# Patient Record
Sex: Female | Born: 1960 | Race: Black or African American | Hispanic: No | Smoking: Former smoker
Health system: Southern US, Community
[De-identification: ages and names within clinical notes are randomized; demographics above are authoritative.]

## PROBLEM LIST (undated history)

## (undated) DIAGNOSIS — I10 Essential (primary) hypertension: Secondary | ICD-10-CM

## (undated) DIAGNOSIS — T7840XA Allergy, unspecified, initial encounter: Secondary | ICD-10-CM

## (undated) DIAGNOSIS — Z9889 Other specified postprocedural states: Secondary | ICD-10-CM

## (undated) DIAGNOSIS — D649 Anemia, unspecified: Secondary | ICD-10-CM

## (undated) DIAGNOSIS — M1990 Unspecified osteoarthritis, unspecified site: Secondary | ICD-10-CM

## (undated) DIAGNOSIS — E039 Hypothyroidism, unspecified: Secondary | ICD-10-CM

## (undated) DIAGNOSIS — E079 Disorder of thyroid, unspecified: Secondary | ICD-10-CM

## (undated) DIAGNOSIS — Z87442 Personal history of urinary calculi: Secondary | ICD-10-CM

## (undated) DIAGNOSIS — E119 Type 2 diabetes mellitus without complications: Secondary | ICD-10-CM

## (undated) HISTORY — DX: Allergy, unspecified, initial encounter: T78.40XA

## (undated) HISTORY — PX: TONSILLECTOMY: SUR1361

## (undated) HISTORY — DX: Essential (primary) hypertension: I10

## (undated) HISTORY — PX: BLADDER SURGERY: SHX569

## (undated) HISTORY — PX: BREAST BIOPSY: SHX20

## (undated) HISTORY — PX: THYROIDECTOMY: SHX17

## (undated) HISTORY — DX: Unspecified osteoarthritis, unspecified site: M19.90

## (undated) HISTORY — DX: Disorder of thyroid, unspecified: E07.9

## (undated) HISTORY — PX: WISDOM TOOTH EXTRACTION: SHX21

## (undated) HISTORY — DX: Anemia, unspecified: D64.9

## (undated) HISTORY — PX: BREAST SURGERY: SHX581

---

## 2010-10-20 ENCOUNTER — Other Ambulatory Visit: Payer: Self-pay | Admitting: Specialist

## 2010-10-20 DIAGNOSIS — E039 Hypothyroidism, unspecified: Secondary | ICD-10-CM

## 2010-10-22 ENCOUNTER — Ambulatory Visit
Admission: RE | Admit: 2010-10-22 | Discharge: 2010-10-22 | Disposition: A | Discharge status: Home / Self Care | Payer: Medicaid Other | Source: Ambulatory Visit | Attending: Specialist | Admitting: Specialist

## 2010-10-22 DIAGNOSIS — E039 Hypothyroidism, unspecified: Secondary | ICD-10-CM

## 2010-11-10 ENCOUNTER — Encounter: Payer: Self-pay | Admitting: Obstetrics & Gynecology

## 2010-11-10 ENCOUNTER — Ambulatory Visit (INDEPENDENT_AMBULATORY_CARE_PROVIDER_SITE_OTHER): Payer: Medicaid Other | Admitting: Obstetrics & Gynecology

## 2010-11-10 ENCOUNTER — Other Ambulatory Visit (HOSPITAL_COMMUNITY)
Admission: RE | Admit: 2010-11-10 | Discharge: 2010-11-10 | Disposition: A | Discharge status: Home / Self Care | Payer: Medicaid Other | Source: Ambulatory Visit | Attending: Obstetrics & Gynecology | Admitting: Obstetrics & Gynecology

## 2010-11-10 VITALS — BP 102/71 | HR 80 | Ht 65.0 in | Wt 226.0 lb

## 2010-11-10 DIAGNOSIS — Z1272 Encounter for screening for malignant neoplasm of vagina: Secondary | ICD-10-CM

## 2010-11-10 DIAGNOSIS — Z01419 Encounter for gynecological examination (general) (routine) without abnormal findings: Secondary | ICD-10-CM

## 2010-11-10 DIAGNOSIS — Z1231 Encounter for screening mammogram for malignant neoplasm of breast: Secondary | ICD-10-CM

## 2010-11-10 DIAGNOSIS — N949 Unspecified condition associated with female genital organs and menstrual cycle: Secondary | ICD-10-CM

## 2010-11-10 DIAGNOSIS — Z113 Encounter for screening for infections with a predominantly sexual mode of transmission: Secondary | ICD-10-CM | POA: Insufficient documentation

## 2010-11-10 DIAGNOSIS — N951 Menopausal and female climacteric states: Secondary | ICD-10-CM

## 2010-11-10 DIAGNOSIS — N938 Other specified abnormal uterine and vaginal bleeding: Secondary | ICD-10-CM

## 2010-11-10 DIAGNOSIS — E079 Disorder of thyroid, unspecified: Secondary | ICD-10-CM

## 2010-11-10 MED ORDER — VENLAFAXINE HCL ER 75 MG PO CP24: 75.0000 mg | ORAL_CAPSULE | Freq: Every day | ORAL | Status: DC

## 2010-11-10 NOTE — Patient Instructions (Signed)

## 2010-11-10 NOTE — Progress Notes (Signed)
  Subjective:     Katherine Estes is a 50 y.o. female here for a routine exam.  Current complaints: perimenopausal mood lability, loss of libido, irregular menses during months of April-June.  She reported having two periods a month during those months, lasting 5-7 days.  July was normal.  No other symptoms.  Followed by her PCP for her thyroid issues, is being referred to endocrinology.   Gynecologic History Patient's last menstrual period was 10/12/2010. Contraception: abstinence Last Pap: 11/03/09. Results were: normal Last mammogram: 9 years ago. Results were: normal  Obstetric History [id number]  SVD x 1 C/S x 1, SAB x 2  The following portions of the patient's history were reviewed and updated as appropriate: allergies, current medications, past family history, past medical history, past social history, past surgical history and problem list.  Review of Systems Pertinent items are noted in HPI.    Objective:    GENERAL: Well-developed, well-nourished female in no acute distress.  HEENT: Normocephalic, atraumatic. Sclerae anicteric.  NECK: Supple. Thyroid surgery scar. LUNGS: Clear to auscultation bilaterally.  HEART: Regular rate and rhythm. BREASTS: Symmetric with everted nipples. No masses, skin changes, nipple drainage,   lymphadenopathy. ABDOMEN: Soft, nontender, obese, nondistended. No organomegaly. PELVIC: Normal external female genitalia. Vagina is pink and rugated.  Normal discharge. Normal cervix contour. Uterus is normal in size. No adnexal mass or tenderness. Pap smear obtained. Wet prep obtained as part of STI screening. EXTREMITIES: No cyanosis, clubbing, or edema, 2+ distal pulses.     Assessment:   Healthy female exam. Desires STI screening. Perimenopausal symptoms: DUB, mood lability, loss of libido   Plan:  Will follow up pap, STI screening labs; schedule mammogram Counseled regarding perimenopausal symptoms: recommended Effexor for mood swings.  Patient agreed,  medication e-prescribed.  As for her loss of libido, patient declines counseling/other treatment for now, will continue to monitor.  For her irregular menses, will obtain a pelvic ultrasound, may need endometrial biopsy depending on results or if her symptoms continue.  Bleeding precautions reviewed.  Will also followup endocrinology recommendations, thyroid dysfunction could also potentiate these perimenopausal symptoms.  RTC in 2 months for followup.  Dalon Reichart A 11/10/2010 9:50 AM

## 2010-11-11 LAB — HEPATITIS B SURFACE ANTIBODY,QUALITATIVE: Hep B S Ab: NEGATIVE

## 2010-11-11 LAB — HEPATITIS C ANTIBODY: HCV Ab: NEGATIVE

## 2010-11-11 LAB — HIV ANTIBODY (ROUTINE TESTING W REFLEX): HIV: NONREACTIVE

## 2010-11-11 LAB — WET PREP, GENITAL: Yeast Wet Prep HPF POC: NONE SEEN

## 2010-11-20 ENCOUNTER — Ambulatory Visit (HOSPITAL_COMMUNITY): Payer: Medicaid Other

## 2010-11-20 ENCOUNTER — Other Ambulatory Visit (HOSPITAL_COMMUNITY): Payer: Medicaid Other

## 2010-11-25 ENCOUNTER — Encounter: Payer: Self-pay | Admitting: Obstetrics & Gynecology

## 2010-11-27 ENCOUNTER — Ambulatory Visit (HOSPITAL_COMMUNITY)
Admission: RE | Admit: 2010-11-27 | Discharge: 2010-11-27 | Disposition: A | Discharge status: Home / Self Care | Payer: Medicaid Other | Source: Ambulatory Visit | Attending: Obstetrics & Gynecology | Admitting: Obstetrics & Gynecology

## 2010-11-27 DIAGNOSIS — N938 Other specified abnormal uterine and vaginal bleeding: Secondary | ICD-10-CM | POA: Insufficient documentation

## 2010-11-27 DIAGNOSIS — Z1231 Encounter for screening mammogram for malignant neoplasm of breast: Secondary | ICD-10-CM

## 2010-11-27 DIAGNOSIS — N949 Unspecified condition associated with female genital organs and menstrual cycle: Secondary | ICD-10-CM | POA: Insufficient documentation

## 2010-11-27 DIAGNOSIS — D259 Leiomyoma of uterus, unspecified: Secondary | ICD-10-CM | POA: Insufficient documentation

## 2010-11-27 DIAGNOSIS — N83209 Unspecified ovarian cyst, unspecified side: Secondary | ICD-10-CM | POA: Insufficient documentation

## 2010-12-24 ENCOUNTER — Ambulatory Visit (INDEPENDENT_AMBULATORY_CARE_PROVIDER_SITE_OTHER): Payer: Medicaid Other | Admitting: Obstetrics & Gynecology

## 2010-12-24 ENCOUNTER — Encounter: Payer: Self-pay | Admitting: Obstetrics & Gynecology

## 2010-12-24 DIAGNOSIS — D219 Benign neoplasm of connective and other soft tissue, unspecified: Secondary | ICD-10-CM | POA: Insufficient documentation

## 2010-12-24 DIAGNOSIS — N926 Irregular menstruation, unspecified: Secondary | ICD-10-CM

## 2010-12-24 DIAGNOSIS — D259 Leiomyoma of uterus, unspecified: Secondary | ICD-10-CM

## 2010-12-24 NOTE — Progress Notes (Signed)
History:  50 yo [id number] here for followup for irregular menses evaluation.  Followed by an endocrinologist for thyroid dysfunction; will see this provider in a few days. Pelvic ultrasound ordered at last visit. Patient reports having a period that lasted seven days on 11/28/10 then again on 12/13/10.  She feels that her period is about to start today.  Denies LH, dizziness or other symptoms.  Objective:  Labs and Imaging TRANSABDOMINAL AND TRANSVAGINAL ULTRASOUND OF PELVIS (11/27/10) Findings:  Uterus: 10.6 x 5.8 x 5.9 cm. Multiple small fibroids are seen which involve the uterus diffusely. At least four discrete fibroids are measurable, and these range in size from 0.8 cm to 1.9 cm in maximum diameter.  Endometrium: Suboptimally visualized due to acoustic shadowing from fibroids described above. Maximum endometrial thickness where visualized measures approximately 5 mm.  Right ovary: A small hemorrhagic corpus luteum measuring 1.7 cm. No evidence of adnexal mass.  Left ovary: Normal appearance/no adnexal mass.  Other Findings: No free fluid  IMPRESSION:  1. Multiple small uterine fibroids, largest measuring 1.9 cm.  2. 1.7 cm right ovarian hemorrhagic cyst with benign features.   Given continued bleeding, endometrial biopsy was recommended.    ENDOMETRIAL BIOPSY     The indications for endometrial biopsy were reviewed.   Risks of the biopsy including cramping, bleeding, infection, uterine perforation, inadequate specimen and need for additional procedures  were discussed. The patient states she understands and agrees to undergo procedure today. Consent was signed. Time out was performed.  A sterile speculum was placed in the patient's vagina and the cervix was prepped with Betadine. A single-toothed tenaculum was placed on the anterior lip of the cervix to stabilize it. The 3 mm pipelle was introduced into the endometrial cavity without difficulty to a depth of 6 cm, and a small amount of tissue was  obtained and sent to pathology. There was some cervical stenosis but the pipelle was used to breach this stenosis; and the cavity was entered. The instruments were removed from the patient's vagina. Minimal bleeding from the cervix was noted. The patient tolerated the procedure well. Routine post-procedure instructions were given to the patient. Assessment & Plan:  The patient will follow up to review the results and for further management.  Discussed management options for irregular menses including oral Provera, Depo Provera, Mirena IUD, endometrial ablation (Novasure/Hydrothermal Ablation/Thermachoice) or hysterectomy as definitive surgical management.  Discussed risks and benefits of each method. Patient was given a patient education pamphlet; will decide by next visit in two weeks, pending the pathology results. Will also follow up endocrinologist workup.

## 2011-01-11 ENCOUNTER — Other Ambulatory Visit: Payer: Medicaid Other | Admitting: Obstetrics & Gynecology

## 2011-01-11 DIAGNOSIS — R8789 Other abnormal findings in specimens from female genital organs: Secondary | ICD-10-CM

## 2011-04-07 ENCOUNTER — Encounter: Payer: Self-pay | Admitting: Obstetrics & Gynecology

## 2011-04-07 ENCOUNTER — Ambulatory Visit (INDEPENDENT_AMBULATORY_CARE_PROVIDER_SITE_OTHER): Payer: Medicaid Other | Admitting: Obstetrics & Gynecology

## 2011-04-07 VITALS — BP 105/75 | HR 78 | Ht 68.0 in | Wt 213.0 lb

## 2011-04-07 DIAGNOSIS — B373 Candidiasis of vulva and vagina: Secondary | ICD-10-CM

## 2011-04-07 MED ORDER — FLUCONAZOLE 150 MG PO TABS: 150.0000 mg | ORAL_TABLET | Freq: Once | ORAL | Status: AC

## 2011-04-07 MED ORDER — TERCONAZOLE 0.4 % VA CREA
1.0000 | TOPICAL_CREAM | Freq: Every day | VAGINAL | Status: AC
Start: 2011-04-07 — End: 2011-04-14

## 2011-04-07 NOTE — Patient Instructions (Signed)
Candidal Vulvovaginitis Candidal vulvovaginitis is an infection of the vagina and vulva. The vulva is the skin around the opening of the vagina. This may cause itching and discomfort in and around the vagina.  HOME CARE  Only take medicine as told by your doctor.   Do not have sex (intercourse) until the infection is healed or as told by your doctor.   Practice safe sex.   Tell your sex partner about your infection.   Do not douche or use tampons.   Wear cotton underwear. Do not wear tight pants or panty hose.   Eat yogurt. This may help treat and prevent yeast infections.  GET HELP RIGHT AWAY IF:   You have a fever.   Your problems get worse during treatment or do not get better in 3 days.   You have discomfort, irritation, or itching in your vagina or vulva area.   You have pain after sex.   You start to get belly (abdominal) pain.  MAKE SURE YOU:  Understand these instructions.   Will watch your condition.   Will get help right away if you are not doing well or get worse.  Document Released: 06/04/2008 Document Revised: 11/18/2010 Document Reviewed: 06/04/2008 [employer] Patient Information 2012 [employer]. 

## 2011-04-07 NOTE — Progress Notes (Signed)
History:  51 y.o. with complaint of vaginitis for a few days.  Happened after intercourse.  Discharge is white, clumpy and causes irritation.  The following portions of the patient's history were reviewed and updated as appropriate: allergies, current medications, past family history, past medical history, past social history, past surgical history and problem list. Last pap in 10/2010 was normal, also had normal mammogram in 11/2010.   Objective:  Physical Exam Blood pressure 105/75, pulse 78, height 5\' 8"  (1.727 m), weight 213 lb (96.616 kg), last menstrual period 03/17/2011. Gen: NAD Abd: Soft, nontender and nondistended Pelvic: Normal appearing external genitalia; normal appearing vaginal mucosa and cervix.  Thick, white discharge in vagina and vulva.  Small uterus, no palpable masses or adnexal tenderness. Wet prep and GC/Chlam cultures obtained.  Assessment & Plan:  Will follow up results but will presumptively treat with antifungal medication. Return to clinic for any worsening symptoms.

## 2011-04-08 LAB — WET PREP, GENITAL: Trich, Wet Prep: NONE SEEN

## 2011-04-08 LAB — GC/CHLAMYDIA PROBE AMP, GENITAL: Chlamydia, DNA Probe: NEGATIVE

## 2011-10-27 ENCOUNTER — Ambulatory Visit (INDEPENDENT_AMBULATORY_CARE_PROVIDER_SITE_OTHER): Payer: Medicaid Other | Admitting: Obstetrics & Gynecology

## 2011-10-27 ENCOUNTER — Encounter: Payer: Self-pay | Admitting: Obstetrics & Gynecology

## 2011-10-27 VITALS — BP 139/99 | HR 73 | Ht 68.0 in | Wt 204.0 lb

## 2011-10-27 DIAGNOSIS — B373 Candidiasis of vulva and vagina: Secondary | ICD-10-CM

## 2011-10-27 DIAGNOSIS — Z309 Encounter for contraceptive management, unspecified: Secondary | ICD-10-CM

## 2011-10-27 MED ORDER — NYSTATIN 100000 UNITS VA TABS: 1.0000 | ORAL_TABLET | Freq: Every day | VAGINAL | Status: AC

## 2011-10-27 NOTE — Patient Instructions (Signed)
Vaginitis monilisica (Monilial Vaginitis) La vaginitis es una inflamacin (irritacin, hinchazn) de la vagina y la vulva. Esta no es una enfermedad de transmisin sexual.  CAUSAS Este tipo de vaginitis lo causa un hongo (candida) que normalmente se encuentra en la vagina. El hongo candida se ha desarrollado hasta el punto de ocasionar problemas en el equilibrio qumico. SNTOMAS  Secrecin vaginal espesa y blanca.   Hinchazn, picazn, enrojecimiento e inflamacin de la vagina y en algunos casos de los labios vaginales (vulva).   Ardor o dolor al [employer].   Dolor en las relaciones sexuales.  DIAGNSTICO Los factores que favorecen la vaginitis moniliasica son:  Everlean Patterson de virginidad y postmenopusicas.   Embarazo.   Infecciones.   Sentir cansancio, estar enferma o estresada, especialmente si ya ha sufrido este problema en el pasado.   Diabetes Buen control ayudar a disminur la probabilidad.   Pldoras anticonceptivas   Ropa interior [place].   El uso de espumas de bao, aerosoles femeninos duchas vaginales o tampones con desodorante.   Algunos antibiticos (medicamentos que destruyen grmenes).   Si contrae alguna enfermedad puede sufrir recurrencias espordicas.  TRATAMIENTO El profesional que lo asiste prescribir medicamentos.  Hay diferentes tipos de cremas y supositorios vaginales que tratan especficamente la vaginitis monilisica. Para infecciones por hongos recurrentes, utilice un supositorio o crema en la vagina dos veces por semana, o segn se le indique.   Tambin podrn utilizarse cremas con corticoides o anti monilisicas para la picazn o la irritacin de la vulva. Consulte con el profesional que la asiste.   Si la crema no da resultado, podr aplicarse en la vagina una solucin con azul de metileno.   El consumo de yogur puede prevenir este tipo de vaginitis.  INSTRUCCIONES PARA EL CUIDADO DOMICILIARIO  Tome todos los medicamentos tal como se le  indic.   No mantenga relaciones sexuales hasta que el tratamiento se haya completado, o segn las indicaciones del profesional que la asiste.   Tome baos de asiento tibios.   No se aplique duchas vaginales.   No utilice tampones, especialmente los perfumados.   Use ropa interior de algodn   Mirant pantalones ajustados y las medias tipo panty.   Comunique a sus compaeros sexuales que sufre una infeccin por hongos. Ellos deben concurrir para un control mdico si tienen sntomas como una urticaria leve o picazn.   Sus compaeros sexuales deben tratarse tambin si la infeccin es difcil de Pharmacologist.   Practique el sexo seguro - use condones   Algunos medicamentos vaginales ocasionan fallas en los condones de ltex. Los medicamentos vaginales que pueden daar los condones son:   Chiropodist cleocina   Butoconazole (Femstat)   Terconazole (Terazol) supositorios vaginales   Miconazole (Monistat) (es un medicamento de venta libre)  SOLICITE ATENCIN MDICA SI:  Daphane Shepherd tiene una temperatura oral de ms de 102 F (38.9 C).   Si la infeccin empeora luego de 2 [address].   Si la infeccin no mejora luego de 3 [address].   Aparecen ampollas en o alrededor de la vagina.   Si aparece una hemorragia vaginal y no es el momento del perodo.   Siente dolor al [employer].   Presenta problemas intestinales.   Tiene dolor durante las [employer].  Document Released: 12/16/2004 Document Revised: 02/25/2011 [facility] Patient Information 2012 [place], [place].Contraceptive Implant Information A contraceptive implant is a plastic rod that is inserted under the skin. It is usually inserted under the skin of your upper arm. It  continually releases small amounts of progestin (synthetic progesterone) into the bloodstream. This prevents an egg from being released from the ovary. It also thickens the cervical mucus to prevent sperm from entering the cervix, and it thins  the uterine lining to prevent a fertilized egg from attaching to the uterus. They can be effective for up to 3 years. Implants do not provide protection against sexually transmitted diseases (STDs).  The procedure to insert an implant usually takes about 10 minutes. There may be minor bruising, swelling, and discomfort at the insertion site for a couple days. The implant begins to work within the first day. Other contraceptive protection should be used for 2 weeks. Follow up with your caregiver to get rechecked as directed. Your caregiver will make sure you are a good candidate for the contraceptive implant. Discuss with your caregiver the possible side effects of the implant ADVANTAGES  It prevents pregnancy for up to 3 years.   It is easily reversible.   It is convenient.   The progestins may protect against uterine and ovarian cancer.   It can be used when breastfeeding.   It can be used by women who cannot take estrogen.  DISADVANTAGES  You may have irregular or unplanned vaginal bleeding.   You may develop side effects, including headache, weight gain, acne, breast tenderness, or mood changes.   You may have tissue or nerve damage after insertion (rare).   It may be difficult and uncomfortable to remove.   Certain medications may interfere with the effectiveness of the implants.  REMOVAL OF IMPLANT The implant should be removed in 3 years or as directed by your caregiver. The implants effect wears off in a few hours after removal. Your ability to get pregnant (fertility) is restored within a couple of weeks. New implants can be inserted as soon as the old ones are removed if desired. DO NOT GET THE IMPLANT IF:   You are pregnant.   You have a history of breast cancer, osteoporosis, blood clots, heart disease, diabetes, high blood pressure, liver disease, tumors, or stroke.    You have undiagnosed vaginal bleeding.   You have overly sensitive to certain parts of the  implant.  Document Released: 02/25/2011 Document Reviewed: 02/23/2011 [facility] Patient Information 2012 [place], [place].

## 2011-10-27 NOTE — Progress Notes (Signed)
Subjective:     Patient ID: Katherine Estes, female   DOB: October 28, 1960, 51 y.o.   MRN: [id number]  HPI Pt c/o pain at introitus x 1 day.  No abnl discharge.  Sexually active.  Wants birth control.  BP uncontrolled as pt off meds.  Will f/u with primary care for eval   Review of Systems     Objective:   Physical Exam Pt in NAD GU: EGBUS: no lesions Vagina: no blood in vault, small amount of while cottage cheese d/c at introitus only Cervix: no lesion; no mucopurulent d/c Uterus: small, mobile Adnexa: no masses; sl tender        Assessment:     Yeast vaginitis Contraception counseling.  Reviewed options with pt.  Wants to proceed with Nexplanon     Plan:     F/u for Nexplanon Nystatin for yeast  Loreena Valeri L. Harraway-Smith, M.D., Evern Core

## 2011-10-27 NOTE — Addendum Note (Signed)
Addended by: Barbara Cower on: 10/27/2011 02:52 PM   Modules accepted: Orders

## 2011-11-04 ENCOUNTER — Ambulatory Visit (INDEPENDENT_AMBULATORY_CARE_PROVIDER_SITE_OTHER): Payer: Medicaid Other | Admitting: Obstetrics & Gynecology

## 2011-11-04 ENCOUNTER — Encounter: Payer: Self-pay | Admitting: Obstetrics & Gynecology

## 2011-11-04 VITALS — BP 137/87 | HR 80 | Ht 68.0 in | Wt 205.1 lb

## 2011-11-04 DIAGNOSIS — Z01812 Encounter for preprocedural laboratory examination: Secondary | ICD-10-CM

## 2011-11-04 DIAGNOSIS — Z30017 Encounter for initial prescription of implantable subdermal contraceptive: Secondary | ICD-10-CM

## 2011-11-04 DIAGNOSIS — Z309 Encounter for contraceptive management, unspecified: Secondary | ICD-10-CM

## 2011-11-04 LAB — POCT URINE PREGNANCY: Preg Test, Ur: NEGATIVE

## 2011-11-04 NOTE — Progress Notes (Signed)
Here today for Nexplanon insertion Patient given informed consent, she signed consent form. Pregnancy test was negative.  Appropriate time out taken.  Patient's left arm was prepped and draped in the usual sterile fashion.. The ruler used to measure and mark insertion area.  Patient was prepped with alcohol swab and then injected with 5 ml of 1 % lidocaine.  She was prepped with betadine, Nexplanon removed from packaging,  Device confirmed in needle, then inserted full length of needle and withdrawn per handbook instructions.  There was minimal blood loss.  Patient insertion site covered with guaze and a pressure bandage to reduce any bruising.  The patient tolerated the procedure well and was given post procedure instructions. Return in about two months for Nexplanon check.

## 2011-11-04 NOTE — Patient Instructions (Signed)

## 2011-11-26 ENCOUNTER — Ambulatory Visit (INDEPENDENT_AMBULATORY_CARE_PROVIDER_SITE_OTHER): Payer: Medicaid Other | Admitting: Obstetrics and Gynecology

## 2011-11-26 ENCOUNTER — Encounter: Payer: Self-pay | Admitting: Obstetrics and Gynecology

## 2011-11-26 VITALS — BP 119/84 | HR 84 | Ht 68.0 in | Wt 207.0 lb

## 2011-11-26 DIAGNOSIS — K649 Unspecified hemorrhoids: Secondary | ICD-10-CM

## 2011-11-26 NOTE — Progress Notes (Signed)
Patient ID: Katherine Estes, female   DOB: 19-Aug-1960, 51 y.o.   MRN: [id number] Patient presents today for evaluation of a what she thinks is a boil near her rectal area. Patient states that she noticed it a week ago when having a bowel movement. Patient denies any pain or discomfort from it.  PE: small 0.5 mm hemorrhoid, soft to touch, not bleeding  A/P 51 yo with small non-bleeding rectal hemorrhoid - Reassurance provided - Patient advised to use over the counter medications for comfort care if it becomes an issue - Patient advised to consume a fiber rich diet and drink a lot of water

## 2012-05-17 ENCOUNTER — Ambulatory Visit (INDEPENDENT_AMBULATORY_CARE_PROVIDER_SITE_OTHER): Payer: Medicaid Other | Admitting: Obstetrics and Gynecology

## 2012-05-17 ENCOUNTER — Encounter: Payer: Self-pay | Admitting: Obstetrics and Gynecology

## 2012-05-17 VITALS — BP 125/82 | HR 76 | Ht 68.0 in | Wt 212.0 lb

## 2012-05-17 NOTE — Progress Notes (Signed)
Patient ID: Katherine Estes, female   DOB: 01-20-1961, 52 y.o.   MRN: [id number] Patient presenting today requesting removal in implanon. Implanon was inserted in August 2013 and patient reports increased facial acne and increased appetite and weight gain. Patient is also unhappy with the irregular spotting. Patient is not currently sexually active and if she does, she is planning on using condoms for birth control  Removal Patient given informed consent for removal of her Implanon, time out was performed.  Signed copy in the chart.  Appropriate time out taken. Implanon site identified.  Area prepped in usual sterile fashon. One cc of 1% lidocaine was used to anesthetize the area at the distal end of the implant. A small stab incision was made right beside the implant on the distal portion.  The implanon rod was grasped using hemostats and removed without difficulty.  There was less than 3 cc blood loss. There were no complications.  A small amount of antibiotic ointment and steri-strips were applied over the small incision.  A pressure bandage was applied to reduce any bruising.  The patient tolerated the procedure well and was given post procedure instructions.

## 2012-06-07 ENCOUNTER — Encounter: Payer: Self-pay | Admitting: Obstetrics and Gynecology

## 2012-06-07 ENCOUNTER — Ambulatory Visit (INDEPENDENT_AMBULATORY_CARE_PROVIDER_SITE_OTHER): Payer: Medicaid Other | Admitting: Obstetrics and Gynecology

## 2012-06-07 VITALS — BP 125/86 | HR 81 | Ht 68.0 in | Wt 208.0 lb

## 2012-06-07 DIAGNOSIS — N83209 Unspecified ovarian cyst, unspecified side: Secondary | ICD-10-CM
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
## 2012-06-19 ENCOUNTER — Ambulatory Visit (HOSPITAL_COMMUNITY)
Admission: RE | Admit: 2012-06-19 | Discharge: 2012-06-19 | Disposition: A | Discharge status: Home / Self Care | Payer: Medicaid Other | Source: Ambulatory Visit | Attending: Obstetrics and Gynecology | Admitting: Obstetrics and Gynecology

## 2012-06-19 DIAGNOSIS — N83209 Unspecified ovarian cyst, unspecified side: Secondary | ICD-10-CM

## 2012-06-19 DIAGNOSIS — Z1231 Encounter for screening mammogram for malignant neoplasm of breast: Secondary | ICD-10-CM | POA: Insufficient documentation

## 2012-06-20 ENCOUNTER — Other Ambulatory Visit: Payer: Self-pay | Admitting: Obstetrics and Gynecology

## 2012-06-20 DIAGNOSIS — R928 Other abnormal and inconclusive findings on diagnostic imaging of breast: Secondary | ICD-10-CM

## 2012-06-29 ENCOUNTER — Telehealth: Payer: Self-pay

## 2012-06-29 NOTE — Telephone Encounter (Signed)
Called in Dyflucan 150 mg to her [facility] at [facility] had symptoms of yeast infection. Gave her no refills. Per the Inetta Fermo the nurse

## 2012-07-03 ENCOUNTER — Ambulatory Visit
Admission: RE | Admit: 2012-07-03 | Discharge: 2012-07-03 | Disposition: A | Discharge status: Home / Self Care | Payer: Medicaid Other | Source: Ambulatory Visit | Attending: Obstetrics and Gynecology | Admitting: Obstetrics and Gynecology

## 2012-07-03 DIAGNOSIS — R928 Other abnormal and inconclusive findings on diagnostic imaging of breast: Secondary | ICD-10-CM

## 2012-09-10 ENCOUNTER — Emergency Department (HOSPITAL_COMMUNITY): Payer: Medicaid Other

## 2012-09-10 ENCOUNTER — Emergency Department (HOSPITAL_COMMUNITY)
Admission: EM | Admit: 2012-09-10 | Discharge: 2012-09-10 | Disposition: A | Discharge status: Home / Self Care | Payer: Medicaid Other | Attending: Emergency Medicine | Admitting: Emergency Medicine

## 2012-09-10 ENCOUNTER — Encounter (HOSPITAL_COMMUNITY): Payer: Self-pay | Admitting: Emergency Medicine

## 2012-09-10 DIAGNOSIS — N949 Unspecified condition associated with female genital organs and menstrual cycle: Secondary | ICD-10-CM | POA: Insufficient documentation

## 2012-09-10 DIAGNOSIS — E079 Disorder of thyroid, unspecified: Secondary | ICD-10-CM | POA: Insufficient documentation

## 2012-09-10 DIAGNOSIS — R102 Pelvic and perineal pain: Secondary | ICD-10-CM

## 2012-09-10 DIAGNOSIS — M810 Age-related osteoporosis without current pathological fracture: Secondary | ICD-10-CM | POA: Insufficient documentation

## 2012-09-10 DIAGNOSIS — Z880 Allergy status to penicillin: Secondary | ICD-10-CM | POA: Insufficient documentation

## 2012-09-10 DIAGNOSIS — M129 Arthropathy, unspecified: Secondary | ICD-10-CM | POA: Insufficient documentation

## 2012-09-10 DIAGNOSIS — Z79899 Other long term (current) drug therapy: Secondary | ICD-10-CM | POA: Insufficient documentation

## 2012-09-10 DIAGNOSIS — I10 Essential (primary) hypertension: Secondary | ICD-10-CM | POA: Insufficient documentation

## 2012-09-10 DIAGNOSIS — Z862 Personal history of diseases of the blood and blood-forming organs and certain disorders involving the immune mechanism: Secondary | ICD-10-CM | POA: Insufficient documentation

## 2012-09-10 DIAGNOSIS — J45909 Unspecified asthma, uncomplicated: Secondary | ICD-10-CM | POA: Insufficient documentation

## 2012-09-10 DIAGNOSIS — Z87891 Personal history of nicotine dependence: Secondary | ICD-10-CM | POA: Insufficient documentation

## 2012-09-10 LAB — URINALYSIS, ROUTINE W REFLEX MICROSCOPIC
Glucose, UA: NEGATIVE mg/dL
Ketones, ur: NEGATIVE mg/dL
Protein, ur: NEGATIVE mg/dL
Urobilinogen, UA: 0.2 mg/dL (ref 0.0–1.0)

## 2012-09-10 LAB — URINE MICROSCOPIC-ADD ON

## 2012-09-10 MED ORDER — IBUPROFEN 800 MG PO TABS
800.0000 mg | ORAL_TABLET | Freq: Once | ORAL | Status: AC
  Administered 2012-09-10: 800 mg via ORAL
  Filled 2012-09-10: qty 1

## 2012-09-10 MED ORDER — OXYCODONE-ACETAMINOPHEN 5-325 MG PO TABS: 1.0000 | ORAL_TABLET | Freq: Four times a day (QID) | ORAL | Status: DC | PRN

## 2012-09-10 NOTE — ED Notes (Signed)
Pt c/o left side abdominal pain onset Thursday. Pt reports history of ovarian cysts and contributed pain to that and being on her menstrual. Pt reports period has since ended 2 days ago but continues to have pain. Pt reports nausea x 2 days. Pt denies urinary symptoms.

## 2012-09-10 NOTE — ED Notes (Signed)
Pt ambulated to the BR.

## 2012-09-10 NOTE — ED Notes (Signed)
Pt in ultrasound

## 2012-09-10 NOTE — ED Notes (Signed)
Dr. Pickering at the bedside.  

## 2012-09-10 NOTE — ED Provider Notes (Signed)
History     CSN: [id number]  Arrival date & time 09/10/12  1231   First MD Initiated Contact with Patient 09/10/12 1251      Chief Complaint  Patient presents with  . Abdominal Pain    Left side    (Consider location/radiation/quality/duration/timing/severity/associated sxs/prior treatment) Patient is a 52 y.o. female presenting with abdominal pain. The history is provided by the patient.  Abdominal Pain This is a recurrent problem. Associated symptoms include abdominal pain. Pertinent negatives include no chest pain, no headaches and no shortness of breath.   patient has had left-sided pelvic pain for the last week. It started as her typical menstrual cramps. She states it went away then became more severe. She states she finished up a Couple days ago. No dysuria. No nausea vomiting or diarrhea. No hematuria. No vaginal discharge. She states she's been seen this before a CT that showed a cyst. She states that another ultrasound which did not show the cyst. No fevers. No change in her bowel habits  Past Medical History  Diagnosis Date  . Allergy   . Anemia   . Arthritis   . Asthma   . Hypertension   . Thyroid disease   . Osteoporosis     Past Surgical History  Procedure Laterality Date  . Cesarean section    . Tonsillectomy    . Bladder surgery      BLADDER LIFTED  . Thyroidectomy    . Breast surgery      LUMP EXCISION LEFT BREAST.  Marland Kitchen Wisdom tooth extraction      Family History  Problem Relation Age of Onset  . Arthritis Mother   . Asthma Mother   . Hypertension Mother   . Cancer Maternal Grandmother     THYRIOD    History  Substance Use Topics  . Smoking status: Former Smoker    Types: Cigarettes  . Smokeless tobacco: Not on file     Comment: x 35yrs ago.  . Alcohol Use: Yes     Comment: socially     OB History   Grav Para Term Preterm Abortions TAB SAB Ect Mult Living                  Review of Systems  Constitutional: Negative for activity  change and appetite change.  HENT: Negative for neck stiffness.   Eyes: Negative for pain.  Respiratory: Negative for chest tightness and shortness of breath.   Cardiovascular: Negative for chest pain and leg swelling.  Gastrointestinal: Positive for abdominal pain. Negative for nausea, vomiting and diarrhea.  Genitourinary: Positive for pelvic pain. Negative for flank pain, decreased urine volume, vaginal bleeding, vaginal discharge and vaginal pain.  Musculoskeletal: Negative for back pain.  Skin: Negative for rash.  Neurological: Negative for weakness, numbness and headaches.  Psychiatric/Behavioral: Negative for behavioral problems.    Allergies  Penicillins  Home Medications   Current Outpatient Rx  Name  Route  Sig  Dispense  Refill  . albuterol (PROVENTIL) (5 MG/ML) 0.5% nebulizer solution   Nebulization   Take 2.5 mg by nebulization every 6 (six) hours as needed.           . Biotin 1000 MCG tablet   Oral   Take 1,000 mcg by mouth 3 (three) times daily.         Thomasene Mohair [place] 450 MG CAPS   Oral   Take 450 each by mouth.         . cholecalciferol (VITAMIN  D) 1000 UNITS tablet   Oral   Take 1,000 Units by mouth daily.         . Fluticasone-Salmeterol (ADVAIR) 500-50 MCG/DOSE AEPB   Inhalation   Inhale 1 puff into the lungs every 12 (twelve) hours.           Marland Kitchen levothyroxine (SYNTHROID, LEVOTHROID) 175 MCG tablet   Oral   Take 175 mcg by mouth daily.         Marland Kitchen loratadine (CLARITIN) 10 MG tablet   Oral   Take 10 mg by mouth daily.         . methocarbamol (ROBAXIN) 500 MG tablet   Oral   Take 500 mg by mouth 2 (two) times daily.         . Olmesartan-Amlodipine-HCTZ (TRIBENZOR) 20-5-12.5 MG TABS   Oral   Take 1 tablet by mouth daily.         . phentermine 37.5 MG capsule   Oral   Take 37.5 mg by mouth every morning.         . topiramate (TOPAMAX) 100 MG tablet   Oral   Take 100 mg by mouth 2 (two) times daily.         Marland Kitchen  oxyCODONE-acetaminophen (PERCOCET/ROXICET) 5-325 MG per tablet   Oral   Take 1-2 tablets by mouth every 6 (six) hours as needed for pain.   15 tablet   0     BP 142/80  Pulse 91  Temp(Src) 98.4 F (36.9 C) (Oral)  Resp 18  SpO2 98%  LMP 09/04/2012  Physical Exam  Nursing note and vitals reviewed. Constitutional: She is oriented to person, place, and time. She appears well-developed and well-nourished.  HENT:  Head: Normocephalic and atraumatic.  Eyes: EOM are normal. Pupils are equal, round, and reactive to light.  Neck: Normal range of motion. Neck supple.  Cardiovascular: Normal rate, regular rhythm and normal heart sounds.   No murmur heard. Pulmonary/Chest: Effort normal and breath sounds normal. No respiratory distress. She has no wheezes. She has no rales.  Abdominal: Soft. Bowel sounds are normal. She exhibits no distension. There is no tenderness. There is no rebound and no guarding.  Genitourinary:  Pelvic exam deferred  Musculoskeletal: Normal range of motion.  Neurological: She is alert and oriented to person, place, and time. No cranial nerve deficit.  Skin: Skin is warm and dry.  Psychiatric: She has a normal mood and affect. Her speech is normal.    ED Course  Procedures (including critical care time)  Labs Reviewed  URINALYSIS, ROUTINE W REFLEX MICROSCOPIC - Abnormal; Notable for the following:    APPearance CLOUDY (*)    Hgb urine dipstick SMALL (*)    Leukocytes, UA TRACE (*)    All other components within normal limits  URINE MICROSCOPIC-ADD ON   US Transvaginal Non-ob  09/10/2012   *RADIOLOGY REPORT*  Clinical Data:  Left sided pelvic pain.  TRANSABDOMINAL ULTRASOUND OF PELVIS DOPPLER ULTRASOUND OF OVARIES  Technique:  Transabdominal ultrasound examination of the pelvis was performed including evaluation of the uterus, ovaries, adnexal regions, and pelvic cul-de-sac.  Color and duplex Doppler ultrasound was utilized to evaluate blood flow to the  ovaries.  Comparison:  06/19/2012  Findings:  Uterus:  10.3 x 5.5 x 6.2 cm.  Multiple fibroids, the largest being 2.3 cm in the posterior aspect of the body of the uterus.  No significant change since the prior exam.  Endometrium:  Normal.  5.7 mm in thickness.  Right ovary:  Normal.  1.8 x 1.6 x 1.7 cm.  Left ovary:  Normal.  1.9 x 1 5 x 1.4 cm.  Pulsed Doppler evaluation demonstrates normal low-resistance arterial and venous waveforms in both ovaries.  IMPRESSION: Multiple stable fibroids in the uterus.  Otherwise normal exam. Normal perfusion to the ovaries.  No sonographic evidence for ovarian torsion.   Original Report Authenticated By: Francene Boyers, M.D.   US Pelvis Complete  09/10/2012   *RADIOLOGY REPORT*  Clinical Data:  Left sided pelvic pain.  TRANSABDOMINAL ULTRASOUND OF PELVIS DOPPLER ULTRASOUND OF OVARIES  Technique:  Transabdominal ultrasound examination of the pelvis was performed including evaluation of the uterus, ovaries, adnexal regions, and pelvic cul-de-sac.  Color and duplex Doppler ultrasound was utilized to evaluate blood flow to the ovaries.  Comparison:  06/19/2012  Findings:  Uterus:  10.3 x 5.5 x 6.2 cm.  Multiple fibroids, the largest being 2.3 cm in the posterior aspect of the body of the uterus.  No significant change since the prior exam.  Endometrium:  Normal.  5.7 mm in thickness.  Right ovary:  Normal.  1.8 x 1.6 x 1.7 cm.  Left ovary:  Normal.  1.9 x 1 5 x 1.4 cm.  Pulsed Doppler evaluation demonstrates normal low-resistance arterial and venous waveforms in both ovaries.  IMPRESSION: Multiple stable fibroids in the uterus.  Otherwise normal exam. Normal perfusion to the ovaries.  No sonographic evidence for ovarian torsion.   Original Report Authenticated By: Francene Boyers, M.D.   [place] Art/ven Flow Abd Pelv Doppler  09/10/2012   *RADIOLOGY REPORT*  Clinical Data:  Left sided pelvic pain.  TRANSABDOMINAL ULTRASOUND OF PELVIS DOPPLER ULTRASOUND OF OVARIES  Technique:   Transabdominal ultrasound examination of the pelvis was performed including evaluation of the uterus, ovaries, adnexal regions, and pelvic cul-de-sac.  Color and duplex Doppler ultrasound was utilized to evaluate blood flow to the ovaries.  Comparison:  06/19/2012  Findings:  Uterus:  10.3 x 5.5 x 6.2 cm.  Multiple fibroids, the largest being 2.3 cm in the posterior aspect of the body of the uterus.  No significant change since the prior exam.  Endometrium:  Normal.  5.7 mm in thickness.  Right ovary:  Normal.  1.8 x 1.6 x 1.7 cm.  Left ovary:  Normal.  1.9 x 1 5 x 1.4 cm.  Pulsed Doppler evaluation demonstrates normal low-resistance arterial and venous waveforms in both ovaries.  IMPRESSION: Multiple stable fibroids in the uterus.  Otherwise normal exam. Normal perfusion to the ovaries.  No sonographic evidence for ovarian torsion.   Original Report Authenticated By: Francene Boyers, M.D.     1. Pelvic pain       MDM  Patient has recurrent pelvic pain with her period. Benign examination. No abdominal tenderness. No rash. No bowel issues. GI pathology felt less likely. Urologic pathology felt less likely. Patient feels somewhat better and will be discharged home. She'll followup as needed she'll return for fevers, urinary, or bowel symptoms        Juliet Rude. Rubin Payor, MD 09/10/12 1555

## 2012-09-11 ENCOUNTER — Telehealth: Payer: Self-pay | Admitting: *Deleted

## 2012-09-11 NOTE — Telephone Encounter (Signed)
Pt called adv seen at ED for Left side ABD pain - Didn't get any answers as to what the problem was and can't take the pain meds while working - I adv will make appt for her to come in to see one of the providers here to get an action plan together to correct the abdominal pain

## 2012-09-12 ENCOUNTER — Ambulatory Visit (INDEPENDENT_AMBULATORY_CARE_PROVIDER_SITE_OTHER): Payer: Medicaid Other | Admitting: Obstetrics and Gynecology

## 2012-09-12 VITALS — BP 99/74 | HR 84 | Ht 68.0 in | Wt 216.0 lb

## 2012-09-12 DIAGNOSIS — R1032 Left lower quadrant pain: Secondary | ICD-10-CM

## 2012-09-12 MED ORDER — IBUPROFEN 800 MG PO TABS: 800.0000 mg | ORAL_TABLET | Freq: Three times a day (TID) | ORAL | Status: DC | PRN

## 2012-09-12 NOTE — Progress Notes (Signed)
Left sided pelvic pain, rates at at ten.  Was seen at [facility] ER on Sunday without a definitive diagnosis.  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

## 2012-09-12 NOTE — Progress Notes (Signed)
Patient ID: Katherine Estes, female   DOB: 13-Jan-1961, 52 y.o.   MRN: [id number] 52 yo presenting today as an ED follow up for the evaluation of LLQ pain. Patient states the pain started around the time of her menses on 6/16 but never stopped. She normally suffers from dysmenorrhea and thought that was all but when the pain persisted she presented to the ED on 6/22. Patient describes the pain as a throbbing pain that is not radiating. There are no aggravating or alleviating factors. Patient has a long standing history of constipation and has not had a recent bowel movement in over a week.  Past Medical History  Diagnosis Date  . Allergy   . Anemia   . Arthritis   . Asthma   . Hypertension   . Thyroid disease   . Osteoporosis    Past Surgical History  Procedure Laterality Date  . Cesarean section    . Tonsillectomy    . Bladder surgery      BLADDER LIFTED  . Thyroidectomy    . Breast surgery      LUMP EXCISION LEFT BREAST.  Marland Kitchen Wisdom tooth extraction     Family History  Problem Relation Age of Onset  . Arthritis Mother   . Asthma Mother   . Hypertension Mother   . Cancer Maternal Grandmother     THYRIOD   History  Substance Use Topics  . Smoking status: Former Smoker    Types: Cigarettes  . Smokeless tobacco: Not on file     Comment: x 27yrs ago.  . Alcohol Use: Yes     Comment: socially    Physical exam: GENERAL: Well-developed, well-nourished female in no acute distress.  ABDOMEN: Soft, tenderness in LLQ, non distended, no rebound, no guarding.  PELVIC: Normal external female genitalia.  Uterus is normal in size. No adnexal mass or tenderness. Palpable hard stool in rectum during exam EXTREMITIES: No cyanosis, clubbing, or edema, 2+ distal pulses.  6/22 ultrasound: Findings:  Uterus: 10.3 x 5.5 x 6.2 cm. Multiple fibroids, the largest being  2.3 cm in the posterior aspect of the body of the uterus. No  significant change since the prior exam.  Endometrium: Normal. 5.7 mm  in thickness.  Right ovary: Normal. 1.8 x 1.6 x 1.7 cm.  Left ovary: Normal. 1.9 x 1 5 x 1.4 cm.  Pulsed Doppler evaluation demonstrates normal low-resistance  arterial and venous waveforms in both ovaries.  IMPRESSION:  Multiple stable fibroids in the uterus. Otherwise normal exam.  Normal perfusion to the ovaries.  No sonographic evidence for ovarian torsion.  A/P 52 yo with LLQ pain - no GYN etiology of pain - Advised to use a suppository or laxative to help with constipation - general surgery referral provided to rule out diverticulitis - RTC prn

## 2012-10-19 ENCOUNTER — Other Ambulatory Visit: Payer: Self-pay | Admitting: Physical Medicine and Rehabilitation

## 2012-10-19 DIAGNOSIS — M545 Low back pain, unspecified: Secondary | ICD-10-CM

## 2012-10-19 DIAGNOSIS — M48061 Spinal stenosis, lumbar region without neurogenic claudication: Secondary | ICD-10-CM

## 2012-11-02 ENCOUNTER — Other Ambulatory Visit: Payer: Medicaid Other

## 2012-11-03 ENCOUNTER — Ambulatory Visit
Admission: RE | Admit: 2012-11-03 | Discharge: 2012-11-03 | Disposition: A | Discharge status: Home / Self Care | Payer: Medicaid Other | Source: Ambulatory Visit | Attending: Physical Medicine and Rehabilitation | Admitting: Physical Medicine and Rehabilitation

## 2012-11-03 VITALS — BP 138/82 | HR 66

## 2012-11-03 DIAGNOSIS — N926 Irregular menstruation, unspecified: Secondary | ICD-10-CM

## 2012-11-03 DIAGNOSIS — D219 Benign neoplasm of connective and other soft tissue, unspecified: Secondary | ICD-10-CM

## 2012-11-03 DIAGNOSIS — M545 Low back pain: Secondary | ICD-10-CM

## 2012-11-03 DIAGNOSIS — M48061 Spinal stenosis, lumbar region without neurogenic claudication: Secondary | ICD-10-CM

## 2012-11-03 MED ORDER — METHYLPREDNISOLONE ACETATE 40 MG/ML INJ SUSP (RADIOLOG
120.0000 mg | Freq: Once | INTRAMUSCULAR | Status: AC
  Administered 2012-11-03: 120 mg via EPIDURAL

## 2012-11-03 MED ORDER — IOHEXOL 180 MG/ML  SOLN
1.0000 mL | Freq: Once | INTRAMUSCULAR | Status: AC | PRN
  Administered 2012-11-03: 1 mL via EPIDURAL

## 2012-11-07 ENCOUNTER — Ambulatory Visit (INDEPENDENT_AMBULATORY_CARE_PROVIDER_SITE_OTHER): Payer: Medicaid Other | Admitting: Neurology

## 2012-11-07 ENCOUNTER — Ambulatory Visit (INDEPENDENT_AMBULATORY_CARE_PROVIDER_SITE_OTHER): Payer: Medicaid Other

## 2012-11-07 DIAGNOSIS — M79609 Pain in unspecified limb: Secondary | ICD-10-CM

## 2012-11-07 DIAGNOSIS — Z0289 Encounter for other administrative examinations: Secondary | ICD-10-CM

## 2012-11-07 DIAGNOSIS — R209 Unspecified disturbances of skin sensation: Secondary | ICD-10-CM

## 2012-11-07 NOTE — Procedures (Signed)
  HISTORY:  Katherine Estes is a 52 year old patient with a two-month history of right-sided elbow pain, and some tingling down into the thumb on the right hand, and diffuse pain in the right arm without neck pain. The patient feels weak in the right arm. The patient is being evaluated for a possible neuropathy or a cervical radiculopathy.  NERVE CONDUCTION STUDIES:  Nerve conduction studies were performed on both upper extremities. The distal motor latencies and motor amplitudes for the median and ulnar nerves were within normal limits. The F wave latencies and nerve conduction velocities for these nerves were also normal. The sensory latencies for the median and ulnar nerves were normal.   EMG STUDIES:  EMG study was performed on the right upper extremity:  The first dorsal interosseous muscle reveals 2 to 4 K units with full recruitment. No fibrillations or positive waves were noted. The abductor pollicis brevis muscle reveals 2 to 3 K units with full recruitment. No fibrillations or positive waves were noted. The extensor indicis proprius muscle reveals 1 to 3 K units with full recruitment. No fibrillations or positive waves were noted. The pronator teres muscle reveals 2 to 3 K units with full recruitment. No fibrillations or positive waves were noted. The biceps muscle reveals 1 to 2 K units with full recruitment. No fibrillations or positive waves were noted. The triceps muscle reveals 2 to 4 K units with full recruitment. No fibrillations or positive waves were noted. The anterior deltoid muscle reveals 2 to 3 K units with full recruitment. No fibrillations or positive waves were noted. The cervical paraspinal muscles were tested at 2 levels. No abnormalities of insertional activity were seen at either level tested. There was fair relaxation.   IMPRESSION:  Nerve conduction studies done on both upper extremities were within normal limits. There is no evidence of a neuropathy. EMG  evaluation of the right upper extremity is normal, without evidence of an overlying cervical radiculopathy.  Marlan Palau MD 11/07/2012 10:37 AM  [facility] [address] [place], [place] [address]  Phone [phone] Fax [phone]

## 2012-11-28 ENCOUNTER — Other Ambulatory Visit: Payer: Self-pay | Admitting: Obstetrics & Gynecology

## 2013-01-17 NOTE — Telephone Encounter (Signed)
ERROR

## 2013-02-21 ENCOUNTER — Encounter: Payer: Self-pay | Admitting: Obstetrics and Gynecology

## 2013-02-21 ENCOUNTER — Ambulatory Visit (INDEPENDENT_AMBULATORY_CARE_PROVIDER_SITE_OTHER): Payer: Medicaid Other | Admitting: Obstetrics and Gynecology

## 2013-02-21 VITALS — BP 124/88 | HR 77 | Ht 68.0 in | Wt 207.0 lb

## 2013-02-21 DIAGNOSIS — N912 Amenorrhea, unspecified: Secondary | ICD-10-CM

## 2013-02-21 LAB — POCT URINE PREGNANCY: Preg Test, Ur: NEGATIVE

## 2013-02-21 NOTE — Progress Notes (Signed)
Patient ID: Katherine Estes, female   DOB: 1960/07/11, 52 y.o.   MRN: [id number] 52 yo with history of irregular periods in the past few years who presents today for evaluation of amenorrhea since 01/10/2013. Patient is sexually active and not always using condoms. She has had 2 negative pregnancy tests at home and presents today for evaluation of amenorrhea. Patient also desires to start contraception. Patient is otherwise without complaints.   Office pregnancy test negative today Discussed with patient that she is likely in perimenopausal phase where periods of amenorrhea are expected. Also discussed with patient the definition of menopause.  Patient desires contraception. She has HTN and is not a candidate for combined hormonal contraceptives. Patient is not interested in Nexplanon secondary to past experience. She is not interested in depo-provera either. Patient desires Mirena IUD. Discussed with patient the benefits of using short term contraception such as depo-provera in this perimenopausal period but patient truly desires Mirena insertion.  Patient will return for IUD insertion

## 2013-02-22 ENCOUNTER — Encounter: Payer: Self-pay | Admitting: Obstetrics & Gynecology

## 2013-02-22 ENCOUNTER — Ambulatory Visit (INDEPENDENT_AMBULATORY_CARE_PROVIDER_SITE_OTHER): Payer: Medicaid Other | Admitting: Obstetrics & Gynecology

## 2013-02-22 VITALS — BP 145/82 | HR 108 | Ht 66.0 in | Wt 208.0 lb

## 2013-02-22 DIAGNOSIS — Z30430 Encounter for insertion of intrauterine contraceptive device: Secondary | ICD-10-CM

## 2013-02-22 DIAGNOSIS — Z975 Presence of (intrauterine) contraceptive device: Secondary | ICD-10-CM

## 2013-02-22 NOTE — Progress Notes (Signed)
   Subjective:    Patient ID: Katherine Estes, female    DOB: 10/10/1960, 52 y.o.   MRN: [id number]  HPI 52 yo WW P2 (12 and 52yo) here because she wants Mirena for contraception. She skips some periods. She had the Nexplanon for several months but decided to have it removed because "It made me eat candy and I go a cavity."   Review of Systems     Objective:   Physical Exam  UPT negative, consent signed, Time out procedure done. Cervix prepped with betadine and grasped with a single tooth tenaculum. Mirena was easily placed and the strings were cut to 3-4 cm. Uterus sounded to 9 cm. She tolerated the procedure well.       Assessment & Plan:   Contraception- Mirena RTC 4 weeks for string check

## 2013-03-05 ENCOUNTER — Telehealth: Payer: Self-pay | Admitting: *Deleted

## 2013-03-05 DIAGNOSIS — B379 Candidiasis, unspecified: Secondary | ICD-10-CM

## 2013-03-05 MED ORDER — TERCONAZOLE 0.4 % VA CREA: 1.0000 | TOPICAL_CREAM | Freq: Every day | VAGINAL | Status: DC

## 2013-03-05 MED ORDER — FLUCONAZOLE 150 MG PO TABS: 150.0000 mg | ORAL_TABLET | Freq: Once | ORAL | Status: DC

## 2013-03-05 NOTE — Telephone Encounter (Signed)
Patient has a yeast infection and wishes to have meds called in, she usually gets cream and a pill when she gets one.

## 2013-03-16 ENCOUNTER — Other Ambulatory Visit: Payer: Self-pay | Admitting: Obstetrics and Gynecology

## 2013-03-27 ENCOUNTER — Other Ambulatory Visit (HOSPITAL_COMMUNITY)
Admission: RE | Admit: 2013-03-27 | Discharge: 2013-03-27 | Disposition: A | Discharge status: Home / Self Care | Payer: Medicaid Other | Source: Ambulatory Visit | Attending: Family Medicine | Admitting: Family Medicine

## 2013-03-27 ENCOUNTER — Encounter: Payer: Self-pay | Admitting: Family Medicine

## 2013-03-27 ENCOUNTER — Ambulatory Visit (INDEPENDENT_AMBULATORY_CARE_PROVIDER_SITE_OTHER): Payer: Medicaid Other | Admitting: Family Medicine

## 2013-03-27 VITALS — BP 123/90 | HR 77 | Ht 68.0 in | Wt 213.8 lb

## 2013-03-27 DIAGNOSIS — R87810 Cervical high risk human papillomavirus (HPV) DNA test positive: Secondary | ICD-10-CM | POA: Insufficient documentation

## 2013-03-27 DIAGNOSIS — J309 Allergic rhinitis, unspecified: Secondary | ICD-10-CM

## 2013-03-27 DIAGNOSIS — Z124 Encounter for screening for malignant neoplasm of cervix: Secondary | ICD-10-CM

## 2013-03-27 DIAGNOSIS — Z1151 Encounter for screening for human papillomavirus (HPV): Secondary | ICD-10-CM

## 2013-03-27 DIAGNOSIS — G43909 Migraine, unspecified, not intractable, without status migrainosus: Secondary | ICD-10-CM

## 2013-03-27 DIAGNOSIS — I10 Essential (primary) hypertension: Secondary | ICD-10-CM

## 2013-03-27 DIAGNOSIS — E039 Hypothyroidism, unspecified: Secondary | ICD-10-CM

## 2013-03-27 DIAGNOSIS — Z1239 Encounter for other screening for malignant neoplasm of breast: Secondary | ICD-10-CM

## 2013-03-27 DIAGNOSIS — J4540 Moderate persistent asthma, uncomplicated: Secondary | ICD-10-CM

## 2013-03-27 DIAGNOSIS — N926 Irregular menstruation, unspecified: Secondary | ICD-10-CM

## 2013-03-27 DIAGNOSIS — J45909 Unspecified asthma, uncomplicated: Secondary | ICD-10-CM

## 2013-03-27 IMAGING — MG MM DIGITAL SCREENING BILAT W/ CAD
4 series · 4 of 4 positions shown · non-contrast
Comparison: none

DG SCREEN MAMMOGRAM BILATERAL
Bilateral CC and MLO view(s) were taken.
Technologist: Tiger, Alia.(LUPA)(M)

DIGITAL SCREENING MAMMOGRAM WITH CAD:
There are scattered fibroglandular densities.  No masses or malignant type calcifications are 
identified.
Images were processed with CAD.

[R CC]
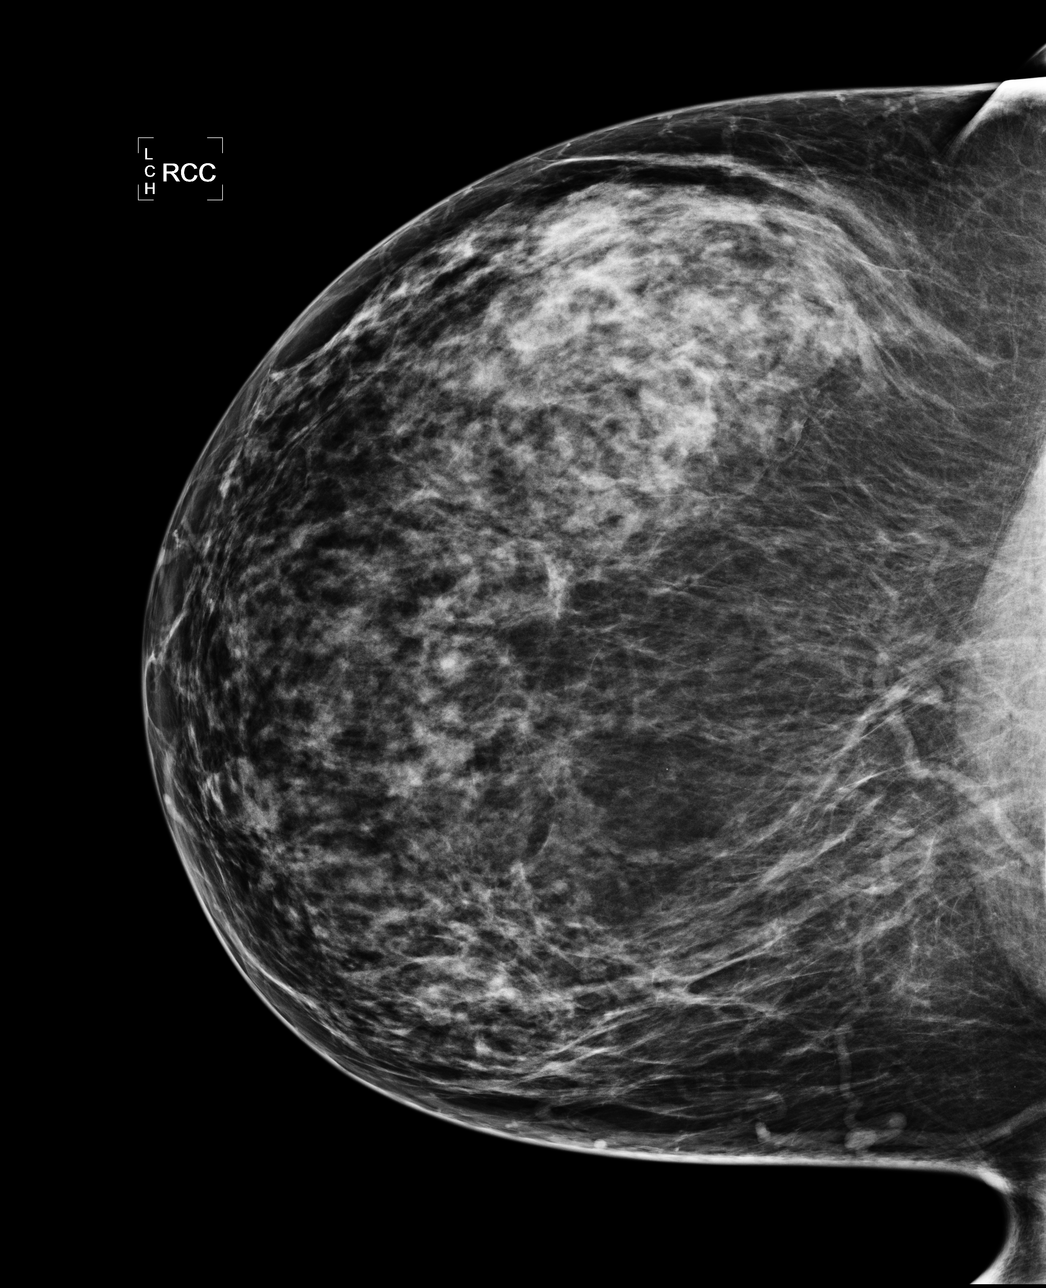

[R MLO]
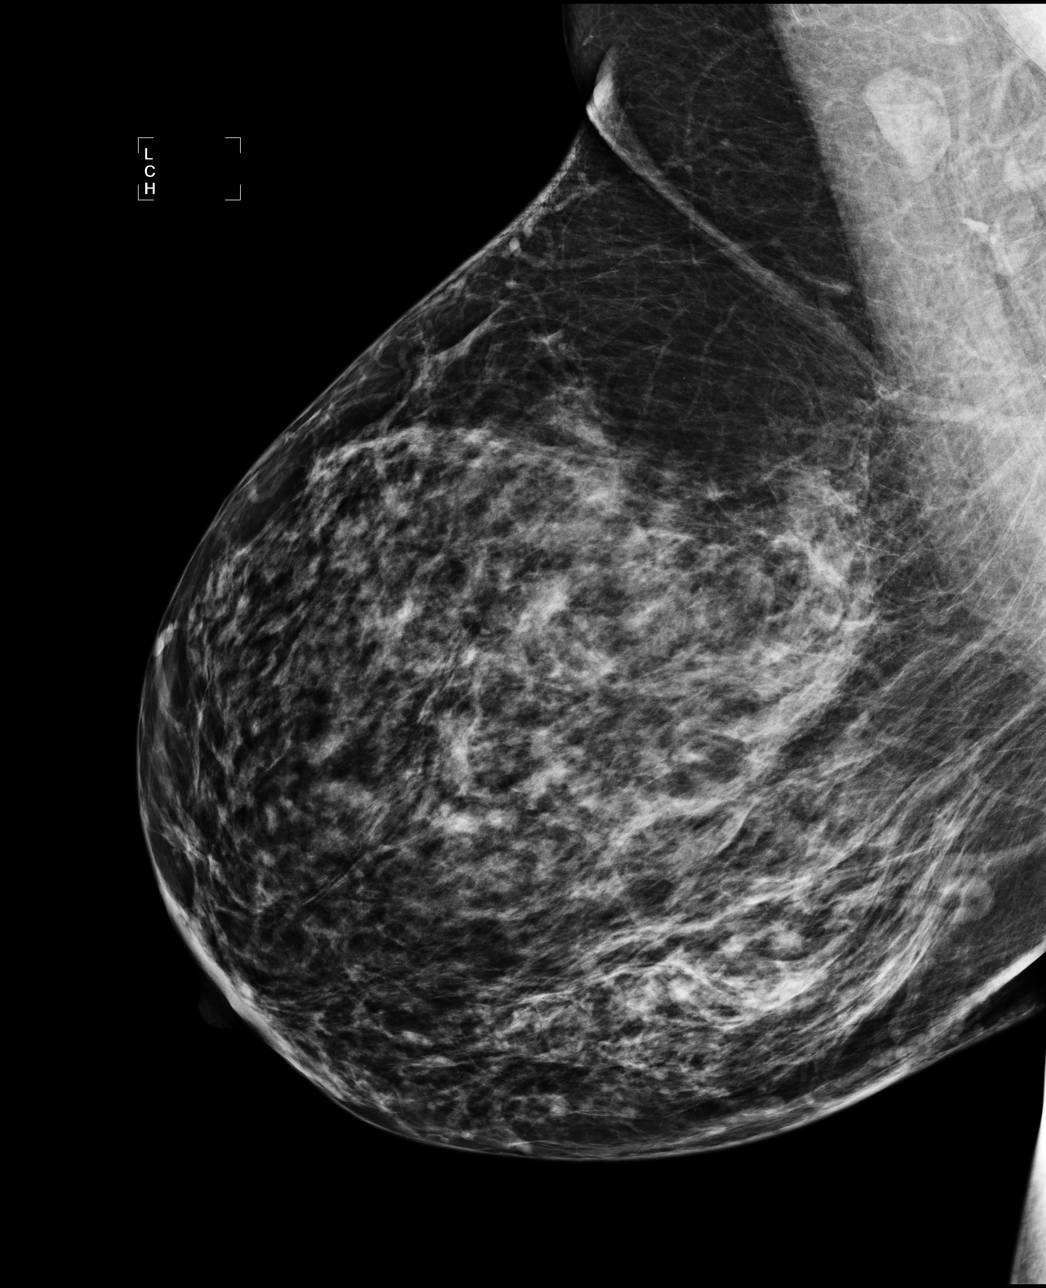

[L CC]
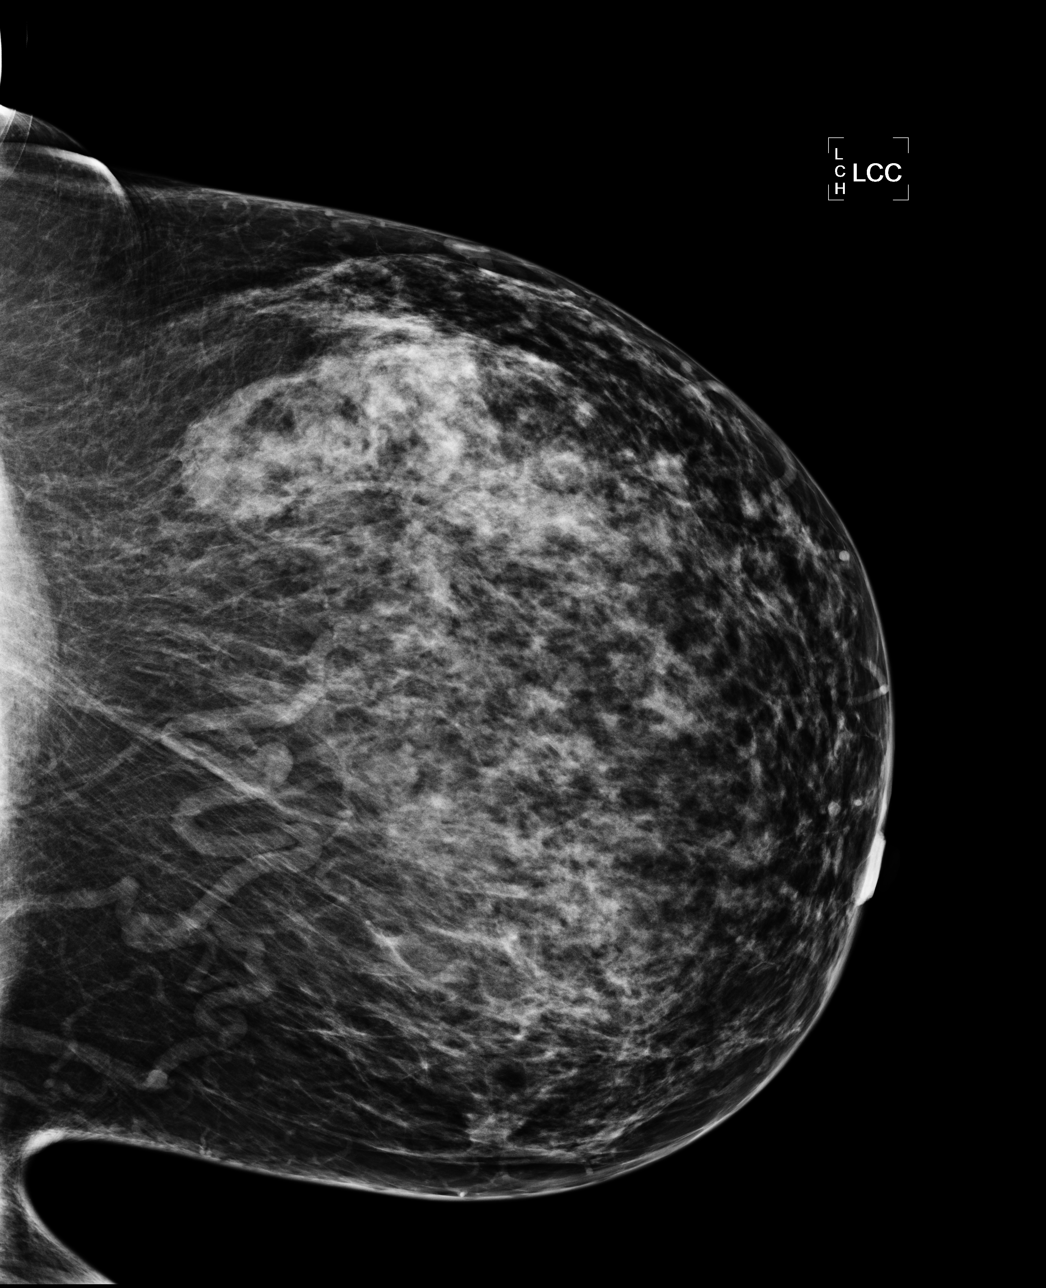

[L MLO]
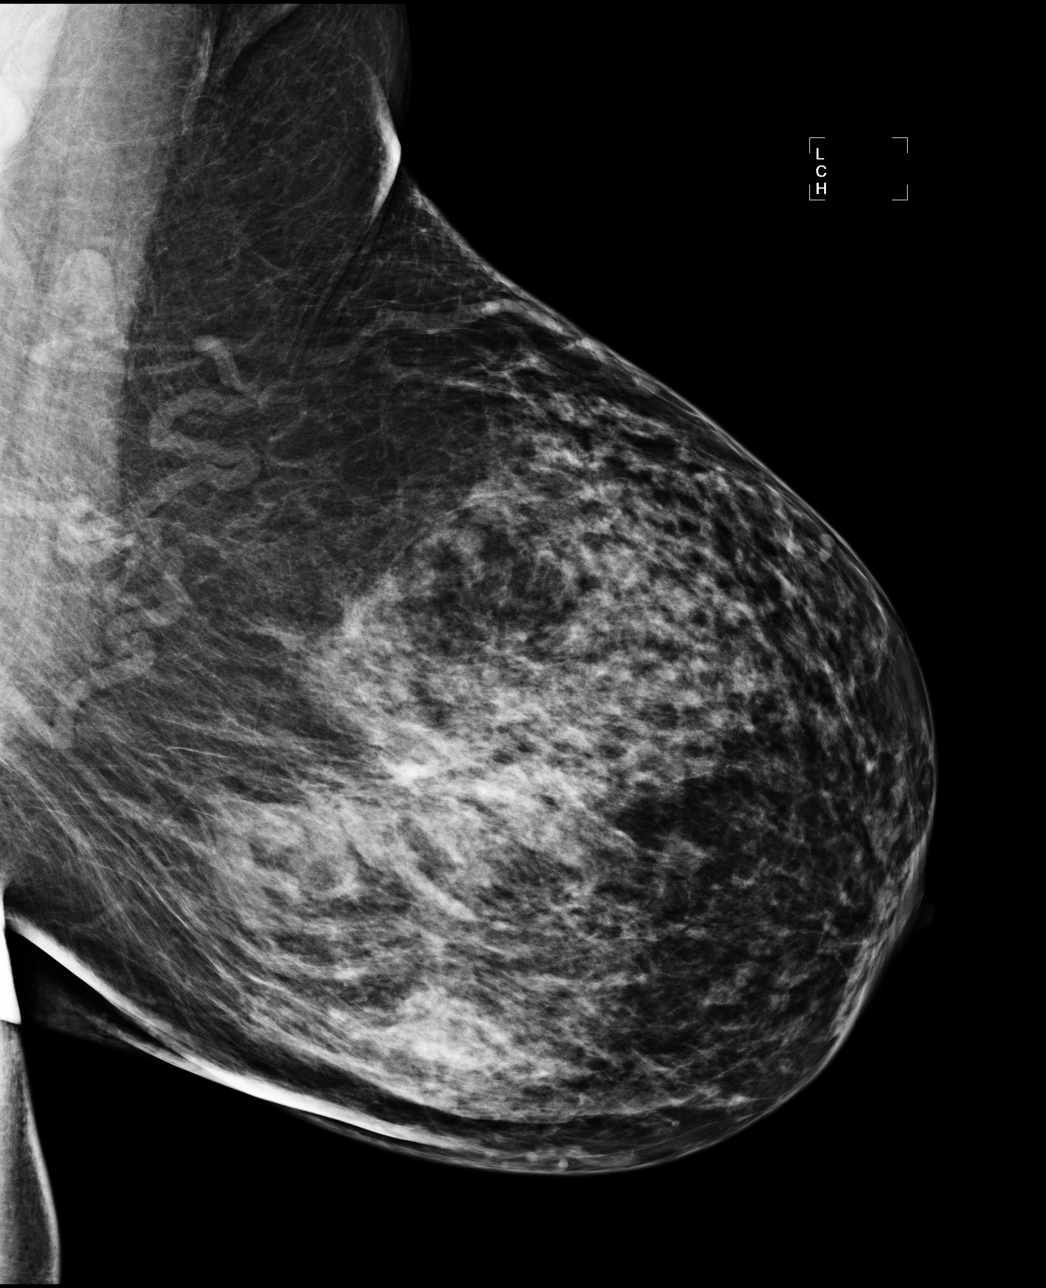

[4 of 4 positions shown; findings below may reference images not displayed]

IMPRESSION: No specific mammographic evidence of malignancy.  Next screening mammogram is recommended in one 
year.

A result letter of this screening mammogram will be mailed directly to the patient.

ASSESSMENT: Negative - BI-RADS 1

Screening mammogram in 1 year.
,

## 2013-03-27 NOTE — Progress Notes (Signed)
   Subjective:    Patient ID: Katherine Estes, female    DOB: May 20, 1960, 53 y.o.   MRN: [id number]  HPI  Here for IUD check.  Spotting since insertion on 02/22/13.  She has a vaginal odor.  She has not had a pap smear in 2 years.  Last Mammogram 4/14.  Had flu shot through work. Has primary for annual blood work.  Review of Systems  Constitutional: Negative for fever and chills.  Respiratory: Negative for shortness of breath.   Gastrointestinal: Negative for nausea, abdominal pain and diarrhea.  Genitourinary: Negative for dysuria.       Objective:   Physical Exam  Vitals reviewed. Constitutional: She appears well-developed and well-nourished. No distress.  HENT:  Head: Normocephalic and atraumatic.  Eyes: No scleral icterus.  Neck: Neck supple.  Cardiovascular: Normal rate.   Pulmonary/Chest: Effort normal.  Abdominal: Soft. There is no tenderness.  Genitourinary:  BUS normal, vagina is pink and rugated, cervix is nulliparous without lesion, uterus is 8-10 wks size and anteverted, no adnexal mass or tenderness. IUD strings visualized.   Neurological: She is alert.          Assessment & Plan:  Screening for breast cancer - Plan: MM Digital Screening  Screening for malignant neoplasm of the cervix - Plan: Cytology - PAP  Hypothyroid  Hypertension  Migraine headache  Asthma, moderate persistent  Allergic rhinitis  Irregular menses

## 2013-03-27 NOTE — Patient Instructions (Signed)
Preventive Care for Adults, Female A healthy lifestyle and preventive care can promote health and wellness. Preventive health guidelines for women include the following key practices.  A routine yearly physical is a good way to check with your caregiver about your health and preventive screening. It is a chance to share any concerns and updates on your health, and to receive a thorough exam.  Visit your dentist for a routine exam and preventive care every 6 months. Brush your teeth twice a day and floss once a day. Good oral hygiene prevents tooth decay and gum disease.  The frequency of eye exams is based on your age, health, family medical history, use of contact lenses, and other factors. Follow your caregiver's recommendations for frequency of eye exams.  Eat a healthy diet. Foods like vegetables, fruits, whole grains, low-fat dairy products, and lean protein foods contain the nutrients you need without too many calories. Decrease your intake of foods high in solid fats, added sugars, and salt. Eat the right amount of calories for you.Get information about a proper diet from your caregiver, if necessary.  Regular physical exercise is one of the most important things you can do for your health. Most adults should get at least 150 minutes of moderate-intensity exercise (any activity that increases your heart rate and causes you to sweat) each week. In addition, most adults need muscle-strengthening exercises on 2 or more days a week.  Maintain a healthy weight. The body mass index (BMI) is a screening tool to identify possible weight problems. It provides an estimate of body fat based on height and weight. Your caregiver can help determine your BMI, and can help you achieve or maintain a healthy weight.For adults 20 years and older:  A BMI below 18.5 is considered underweight.  A BMI of 18.5 to 24.9 is normal.  A BMI of 25 to 29.9 is considered overweight.  A BMI of 30 and above is  considered obese.  Maintain normal blood lipids and cholesterol levels by exercising and minimizing your intake of saturated fat. Eat a balanced diet with plenty of fruit and vegetables. Blood tests for lipids and cholesterol should begin at age 20 and be repeated every 5 years. If your lipid or cholesterol levels are high, you are over 50, or you are at high risk for heart disease, you may need your cholesterol levels checked more frequently.Ongoing high lipid and cholesterol levels should be treated with medicines if diet and exercise are not effective.  If you smoke, find out from your caregiver how to quit. If you do not use tobacco, do not start.  Lung cancer screening is recommended for adults aged 55 80 years who are at high risk for developing lung cancer because of a history of smoking. Yearly low-dose computed tomography (CT) is recommended for people who have at least a 30-pack-year history of smoking and are a current smoker or have quit within the past 15 years. A pack year of smoking is smoking an average of 1 pack of cigarettes a day for 1 year (for example: 1 pack a day for 30 years or 2 packs a day for 15 years). Yearly screening should continue until the smoker has stopped smoking for at least 15 years. Yearly screening should also be stopped for people who develop a health problem that would prevent them from having lung cancer treatment.  If you are pregnant, do not drink alcohol. If you are breastfeeding, be very cautious about drinking alcohol. If you are   not pregnant and choose to drink alcohol, do not exceed 1 drink per day. One drink is considered to be 12 ounces (355 mL) of beer, 5 ounces (148 mL) of wine, or 1.5 ounces (44 mL) of liquor.  Avoid use of street drugs. Do not share needles with anyone. Ask for help if you need support or instructions about stopping the use of drugs.  High blood pressure causes heart disease and increases the risk of stroke. Your blood pressure  should be checked at least every 1 to 2 years. Ongoing high blood pressure should be treated with medicines if weight loss and exercise are not effective.  If you are 61 to 52 years old, ask your caregiver if you should take aspirin to prevent strokes.  Diabetes screening involves taking a blood sample to check your fasting blood sugar level. This should be done once every 3 years, after age 73, if you are within normal weight and without risk factors for diabetes. Testing should be considered at a younger age or be carried out more frequently if you are overweight and have at least 1 risk factor for diabetes.  Breast cancer screening is essential preventive care for women. You should practice "breast self-awareness." This means understanding the normal appearance and feel of your breasts and may include breast self-examination. Any changes detected, no matter how small, should be reported to a caregiver. Women in their 75s and 30s should have a clinical breast exam (CBE) by a caregiver as part of a regular health exam every 1 to 3 years. After age 53, women should have a CBE every year. Starting at age 60, women should consider having a mammography (breast X-ray test) every year. Women who have a family history of breast cancer should talk to their caregiver about genetic screening. Women at a high risk of breast cancer should talk to their caregivers about having magnetic resonance imaging (MRI) and a mammography every year.  Breast cancer gene (BRCA)-related cancer risk assessment is recommended for women who have family members with BRCA-related cancers. BRCA-related cancers include breast, ovarian, tubal, and peritoneal cancers. Having family members with these cancers may be associated with an increased risk for harmful changes (mutations) in the breast cancer genes BRCA1 and BRCA2. Results of the assessment will determine the need for genetic counseling and BRCA1 and BRCA2 testing.  The Pap test is  a screening test for cervical cancer. A Pap test can show cell changes on the cervix that might become cervical cancer if left untreated. A Pap test is a procedure in which cells are obtained and examined from the lower end of the uterus (cervix).  Women should have a Pap test starting at age 85.  Between ages 23 and 51, Pap tests should be repeated every 2 years.  Beginning at age 42, you should have a Pap test every 3 years as long as the past 3 Pap tests have been normal.  Some women have medical problems that increase the chance of getting cervical cancer. Talk to your caregiver about these problems. It is especially important to talk to your caregiver if a new problem develops soon after your last Pap test. In these cases, your caregiver may recommend more frequent screening and Pap tests.  The above recommendations are the same for women who have or have not gotten the vaccine for human papillomavirus (HPV).  If you had a hysterectomy for a problem that was not cancer or a condition that could lead to cancer, then  you no longer need Pap tests. Even if you no longer need a Pap test, a regular exam is a good idea to make sure no other problems are starting.  If you are between ages 79 and 19, and you have had normal Pap tests going back 10 years, you no longer need Pap tests. Even if you no longer need a Pap test, a regular exam is a good idea to make sure no other problems are starting.  If you have had past treatment for cervical cancer or a condition that could lead to cancer, you need Pap tests and screening for cancer for at least 20 years after your treatment.  If Pap tests have been discontinued, risk factors (such as a new sexual partner) need to be reassessed to determine if screening should be resumed.  The HPV test is an additional test that may be used for cervical cancer screening. The HPV test looks for the virus that can cause the cell changes on the cervix. The cells collected  during the Pap test can be tested for HPV. The HPV test could be used to screen women aged 90 years and older, and should be used in women of any age who have unclear Pap test results. After the age of 9, women should have HPV testing at the same frequency as a Pap test.  Colorectal cancer can be detected and often prevented. Most routine colorectal cancer screening begins at the age of 63 and continues through age 61. However, your caregiver may recommend screening at an earlier age if you have risk factors for colon cancer. On a yearly basis, your caregiver may provide home test kits to check for hidden blood in the stool. Use of a small camera at the end of a tube, to directly examine the colon (sigmoidoscopy or colonoscopy), can detect the earliest forms of colorectal cancer. Talk to your caregiver about this at age 32, when routine screening begins. Direct examination of the colon should be repeated every 5 to 10 years through age 7, unless early forms of pre-cancerous polyps or small growths are found.  Hepatitis C blood testing is recommended for all people born from [birth year] through 1965 and any individual with known risks for hepatitis C.  Practice safe sex. Use condoms and avoid high-risk sexual practices to reduce the spread of sexually transmitted infections (STIs). STIs include gonorrhea, chlamydia, syphilis, trichomonas, herpes, HPV, and human immunodeficiency virus (HIV). Herpes, HIV, and HPV are viral illnesses that have no cure. They can result in disability, cancer, and death. Sexually active women aged 7 and younger should be checked for chlamydia. Older women with new or multiple partners should also be tested for chlamydia. Testing for other STIs is recommended if you are sexually active and at increased risk.  Osteoporosis is a disease in which the bones lose minerals and strength with aging. This can result in serious bone fractures. The risk of osteoporosis can be identified using a  bone density scan. Women ages 30 and over and women at risk for fractures or osteoporosis should discuss screening with their caregivers. Ask your caregiver whether you should take a calcium supplement or vitamin D to reduce the rate of osteoporosis.  Menopause can be associated with physical symptoms and risks. Hormone replacement therapy is available to decrease symptoms and risks. You should talk to your caregiver about whether hormone replacement therapy is right for you.  Use sunscreen. Apply sunscreen liberally and repeatedly throughout the day. You should seek shade  when your shadow is shorter than you. Protect yourself by wearing long sleeves, pants, a wide-brimmed hat, and sunglasses year round, whenever you are outdoors.  Once a month, do a whole body skin exam, using a mirror to look at the skin on your back. Notify your caregiver of new moles, moles that have irregular borders, moles that are larger than a pencil eraser, or moles that have changed in shape or color.  Stay current with required immunizations.  Influenza vaccine. All adults should be immunized every year.  Tetanus, diphtheria, and acellular pertussis (Td, Tdap) vaccine. Pregnant women should receive 1 dose of Tdap vaccine during each pregnancy. The dose should be obtained regardless of the length of time since the last dose. Immunization is preferred during the 27th to 36th week of gestation. An adult who has not previously received Tdap or who does not know her vaccine status should receive 1 dose of Tdap. This initial dose should be followed by tetanus and diphtheria toxoids (Td) booster doses every 10 years. Adults with an unknown or incomplete history of completing a 3-dose immunization series with Td-containing vaccines should begin or complete a primary immunization series including a Tdap dose. Adults should receive a Td booster every 10 years.  Varicella vaccine. An adult without evidence of immunity to varicella  should receive 2 doses or a second dose if she has previously received 1 dose. Pregnant females who do not have evidence of immunity should receive the first dose after pregnancy. This first dose should be obtained before leaving the health care facility. The second dose should be obtained 4 8 weeks after the first dose.  Human papillomavirus (HPV) vaccine. Females aged 79 26 years who have not received the vaccine previously should obtain the 3-dose series. The vaccine is not recommended for use in pregnant females. However, pregnancy testing is not needed before receiving a dose. If a female is found to be pregnant after receiving a dose, no treatment is needed. In that case, the remaining doses should be delayed until after the pregnancy. Immunization is recommended for any person with an immunocompromised condition through the age of 21 years if she did not get any or all doses earlier. During the 3-dose series, the second dose should be obtained 4 8 weeks after the first dose. The third dose should be obtained 24 weeks after the first dose and 16 weeks after the second dose.  Zoster vaccine. One dose is recommended for adults aged 56 years or older unless certain conditions are present.  Measles, mumps, and rubella (MMR) vaccine. Adults born before [birth year] generally are considered immune to measles and mumps. Adults born in [birth year] or later should have 1 or more doses of MMR vaccine unless there is a contraindication to the vaccine or there is laboratory evidence of immunity to each of the three diseases. A routine second dose of MMR vaccine should be obtained at least 28 days after the first dose for students attending postsecondary schools, health care workers, or international travelers. People who received inactivated measles vaccine or an unknown type of measles vaccine during 1963 1967 should receive 2 doses of MMR vaccine. People who received inactivated mumps vaccine or an unknown type of mumps vaccine  before 1979 and are at high risk for mumps infection should consider immunization with 2 doses of MMR vaccine. For females of childbearing age, rubella immunity should be determined. If there is no evidence of immunity, females who are not pregnant should be vaccinated. If there  is no evidence of immunity, females who are pregnant should delay immunization until after pregnancy. Unvaccinated health care workers born before [birth year] who lack laboratory evidence of measles, mumps, or rubella immunity or laboratory confirmation of disease should consider measles and mumps immunization with 2 doses of MMR vaccine or rubella immunization with 1 dose of MMR vaccine.  Pneumococcal 13-valent conjugate (PCV13) vaccine. When indicated, a person who is uncertain of her immunization history and has no record of immunization should receive the PCV13 vaccine. An adult aged 57 years or older who has certain medical conditions and has not been previously immunized should receive 1 dose of PCV13 vaccine. This PCV13 should be followed with a dose of pneumococcal polysaccharide (PPSV23) vaccine. The PPSV23 vaccine dose should be obtained at least 8 weeks after the dose of PCV13 vaccine. An adult aged 27 years or older who has certain medical conditions and previously received 1 or more doses of PPSV23 vaccine should receive 1 dose of PCV13. The PCV13 vaccine dose should be obtained 1 or more years after the last PPSV23 vaccine dose.  Pneumococcal polysaccharide (PPSV23) vaccine. When PCV13 is also indicated, PCV13 should be obtained first. All adults aged 21 years and older should be immunized. An adult younger than age 83 years who has certain medical conditions should be immunized. Any person who resides in a nursing home or long-term care facility should be immunized. An adult smoker should be immunized. People with an immunocompromised condition and certain other conditions should receive both PCV13 and PPSV23 vaccines. People  with human immunodeficiency virus (HIV) infection should be immunized as soon as possible after diagnosis. Immunization during chemotherapy or radiation therapy should be avoided. Routine use of PPSV23 vaccine is not recommended for American Indians, [place] Natives, or people younger than 65 years unless there are medical conditions that require PPSV23 vaccine. When indicated, people who have unknown immunization and have no record of immunization should receive PPSV23 vaccine. One-time revaccination 5 years after the first dose of PPSV23 is recommended for people aged 4 64 years who have chronic kidney failure, nephrotic syndrome, asplenia, or immunocompromised conditions. People who received 1 2 doses of PPSV23 before age 54 years should receive another dose of PPSV23 vaccine at age 47 years or later if at least 5 years have passed since the previous dose. Doses of PPSV23 are not needed for people immunized with PPSV23 at or after age 39 years.  Meningococcal vaccine. Adults with asplenia or persistent complement component deficiencies should receive 2 doses of quadrivalent meningococcal conjugate (MenACWY-D) vaccine. The doses should be obtained at least 2 months apart. Microbiologists working with certain meningococcal bacteria, [employer], people at risk during an outbreak, and people who travel to or live in countries with a high rate of meningitis should be immunized. A first-year college student up through age 49 years who is living in a residence hall should receive a dose if she did not receive a dose on or after her 16th birthday. Adults who have certain high-risk conditions should receive one or more doses of vaccine.  Hepatitis A vaccine. Adults who wish to be protected from this disease, have certain high-risk conditions, work with hepatitis A-infected animals, work in hepatitis A research labs, or travel to or work in countries with a high rate of hepatitis A should be immunized. Adults  who were previously unvaccinated and who anticipate close contact with an international adoptee during the first 60 days after arrival in the [place] from a country  with a high rate of hepatitis A should be immunized.  Hepatitis B vaccine. Adults who wish to be protected from this disease, have certain high-risk conditions, may be exposed to blood or other infectious body fluids, are household contacts or sex partners of hepatitis B positive people, are clients or workers in certain care facilities, or travel to or work in countries with a high rate of hepatitis B should be immunized.  Haemophilus influenzae type b (Hib) vaccine. A previously unvaccinated person with asplenia or sickle cell disease or having a scheduled splenectomy should receive 1 dose of Hib vaccine. Regardless of previous immunization, a recipient of a hematopoietic stem cell transplant should receive a 3-dose series 6 12 months after her successful transplant. Hib vaccine is not recommended for adults with HIV infection. Preventive Services / Frequency Ages 91 to 60  Blood pressure check.** / Every 1 to 2 years.  Lipid and cholesterol check.** / Every 5 years beginning at age 64.  Clinical breast exam.** / Every 3 years for women in their 85s and 65s.  BRCA-related cancer risk assessment.** / For women who have family members with a BRCA-related cancer (breast, ovarian, tubal, or peritoneal cancers).  Pap test.** / Every 2 years from ages 20 through 29. Every 3 years starting at age 5 through age 3 or 18 with a history of 3 consecutive normal Pap tests.  HPV screening.** / Every 3 years from ages 52 through ages 45 to 72 with a history of 3 consecutive normal Pap tests.  Hepatitis C blood test.** / For any individual with known risks for hepatitis C.  Skin self-exam. / Monthly.  Influenza vaccine. / Every year.  Tetanus, diphtheria, and acellular pertussis (Tdap, Td) vaccine.** / Consult your caregiver. Pregnant  women should receive 1 dose of Tdap vaccine during each pregnancy. 1 dose of Td every 10 years.  Varicella vaccine.** / Consult your caregiver. Pregnant females who do not have evidence of immunity should receive the first dose after pregnancy.  HPV vaccine. / 3 doses over 6 months, if 45 and younger. The vaccine is not recommended for use in pregnant females. However, pregnancy testing is not needed before receiving a dose.  Measles, mumps, rubella (MMR) vaccine.** / You need at least 1 dose of MMR if you were born in 1957 or later. You may also need a 2nd dose. For females of childbearing age, rubella immunity should be determined. If there is no evidence of immunity, females who are not pregnant should be vaccinated. If there is no evidence of immunity, females who are pregnant should delay immunization until after pregnancy.  Pneumococcal 13-valent conjugate (PCV13) vaccine.** / Consult your caregiver.  Pneumococcal polysaccharide (PPSV23) vaccine.** / 1 to 2 doses if you smoke cigarettes or if you have certain conditions.  Meningococcal vaccine.** / 1 dose if you are age 53 to 51 years and a Market researcher living in a residence hall, or have one of several medical conditions, you need to get vaccinated against meningococcal disease. You may also need additional booster doses.  Hepatitis A vaccine.** / Consult your caregiver.  Hepatitis B vaccine.** / Consult your caregiver.  Haemophilus influenzae type b (Hib) vaccine.** / Consult your caregiver. Ages 26 to 40  Blood pressure check.** / Every 1 to 2 years.  Lipid and cholesterol check.** / Every 5 years beginning at age 70.  Lung cancer screening. / Every year if you are aged 46 80 years and have a 30-pack-year history of smoking and  currently smoke or have quit within the past 15 years. Yearly screening is stopped once you have quit smoking for at least 15 years or develop a health problem that would prevent you from having  lung cancer treatment.  Clinical breast exam.** / Every year after age 40.  BRCA-related cancer risk assessment.** / For women who have family members with a BRCA-related cancer (breast, ovarian, tubal, or peritoneal cancers).  Mammogram.** / Every year beginning at age 40 and continuing for as long as you are in good health. Consult with your caregiver.  Pap test.** / Every 3 years starting at age 30 through age 65 or 70 with a history of 3 consecutive normal Pap tests.  HPV screening.** / Every 3 years from ages 30 through ages 65 to 70 with a history of 3 consecutive normal Pap tests.  Fecal occult blood test (FOBT) of stool. / Every year beginning at age 50 and continuing until age 75. You may not need to do this test if you get a colonoscopy every 10 years.  Flexible sigmoidoscopy or colonoscopy.** / Every 5 years for a flexible sigmoidoscopy or every 10 years for a colonoscopy beginning at age 50 and continuing until age 75.  Hepatitis C blood test.** / For all people born from 1944 through 1965 and any individual with known risks for hepatitis C.  Skin self-exam. / Monthly.  Influenza vaccine. / Every year.  Tetanus, diphtheria, and acellular pertussis (Tdap/Td) vaccine.** / Consult your caregiver. Pregnant women should receive 1 dose of Tdap vaccine during each pregnancy. 1 dose of Td every 10 years.  Varicella vaccine.** / Consult your caregiver. Pregnant females who do not have evidence of immunity should receive the first dose after pregnancy.  Zoster vaccine.** / 1 dose for adults aged 60 years or older.  Measles, mumps, rubella (MMR) vaccine.** / You need at least 1 dose of MMR if you were born in 1957 or later. You may also need a 2nd dose. For females of childbearing age, rubella immunity should be determined. If there is no evidence of immunity, females who are not pregnant should be vaccinated. If there is no evidence of immunity, females who are pregnant should delay  immunization until after pregnancy.  Pneumococcal 13-valent conjugate (PCV13) vaccine.** / Consult your caregiver.  Pneumococcal polysaccharide (PPSV23) vaccine.** / 1 to 2 doses if you smoke cigarettes or if you have certain conditions.  Meningococcal vaccine.** / Consult your caregiver.  Hepatitis A vaccine.** / Consult your caregiver.  Hepatitis B vaccine.** / Consult your caregiver.  Haemophilus influenzae type b (Hib) vaccine.** / Consult your caregiver. Ages 65 and over  Blood pressure check.** / Every 1 to 2 years.  Lipid and cholesterol check.** / Every 5 years beginning at age 20.  Lung cancer screening. / Every year if you are aged 55 80 years and have a 30-pack-year history of smoking and currently smoke or have quit within the past 15 years. Yearly screening is stopped once you have quit smoking for at least 15 years or develop a health problem that would prevent you from having lung cancer treatment.  Clinical breast exam.** / Every year after age 40.  BRCA-related cancer risk assessment.** / For women who have family members with a BRCA-related cancer (breast, ovarian, tubal, or peritoneal cancers).  Mammogram.** / Every year beginning at age 40 and continuing for as long as you are in good health. Consult with your caregiver.  Pap test.** / Every 3 years starting at age   70 through age 47 or 66 with a 3 consecutive normal Pap tests. Testing can be stopped between 65 and 70 with 3 consecutive normal Pap tests and no abnormal Pap or HPV tests in the past 10 years.  HPV screening.** / Every 3 years from ages 79 through ages 33 or 59 with a history of 3 consecutive normal Pap tests. Testing can be stopped between 65 and 70 with 3 consecutive normal Pap tests and no abnormal Pap or HPV tests in the past 10 years.  Fecal occult blood test (FOBT) of stool. / Every year beginning at age 63 and continuing until age 5. You may not need to do this test if you get a colonoscopy  every 10 years.  Flexible sigmoidoscopy or colonoscopy.** / Every 5 years for a flexible sigmoidoscopy or every 10 years for a colonoscopy beginning at age 31 and continuing until age 71.  Hepatitis C blood test.** / For all people born from [birth year] through 1964 and any individual with known risks for hepatitis C.  Osteoporosis screening.** / A one-time screening for women ages 33 and over and women at risk for fractures or osteoporosis.  Skin self-exam. / Monthly.  Influenza vaccine. / Every year.  Tetanus, diphtheria, and acellular pertussis (Tdap/Td) vaccine.** / 1 dose of Td every 10 years.  Varicella vaccine.** / Consult your caregiver.  Zoster vaccine.** / 1 dose for adults aged 1 years or older.  Pneumococcal 13-valent conjugate (PCV13) vaccine.** / Consult your caregiver.  Pneumococcal polysaccharide (PPSV23) vaccine.** / 1 dose for all adults aged 43 years and older.  Meningococcal vaccine.** / Consult your caregiver.  Hepatitis A vaccine.** / Consult your caregiver.  Hepatitis B vaccine.** / Consult your caregiver.  Haemophilus influenzae type b (Hib) vaccine.** / Consult your caregiver. ** Family history and personal history of risk and conditions may change your caregiver's recommendations. Document Released: 05/04/2001 Document Revised: 07/03/2012 Document Reviewed: 08/03/2010 [facility] Patient Information 2014 [place], [place].

## 2013-03-27 NOTE — Assessment & Plan Note (Signed)
IUD in appropriate place--advised nml length of spotting

## 2013-03-29 ENCOUNTER — Encounter: Payer: Self-pay | Admitting: Family Medicine

## 2013-03-29 DIAGNOSIS — R87810 Cervical high risk human papillomavirus (HPV) DNA test positive: Secondary | ICD-10-CM | POA: Insufficient documentation

## 2013-04-02 ENCOUNTER — Telehealth: Payer: Self-pay | Admitting: *Deleted

## 2013-04-02 NOTE — Telephone Encounter (Signed)
Left message for patient that she needed to return my call regarding test results.

## 2013-04-02 NOTE — Telephone Encounter (Signed)
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 ------

## 2013-04-04 ENCOUNTER — Telehealth: Payer: Self-pay | Admitting: *Deleted

## 2013-04-04 NOTE — Telephone Encounter (Signed)
Patient was notified of test results and will follow up next year for her pap smear as recommended.

## 2013-04-26 ENCOUNTER — Other Ambulatory Visit: Payer: Self-pay | Admitting: Obstetrics and Gynecology

## 2013-07-26 ENCOUNTER — Ambulatory Visit: Payer: Medicaid Other | Admitting: Family Medicine

## 2014-06-12 ENCOUNTER — Ambulatory Visit: Payer: Self-pay

## 2014-06-12 ENCOUNTER — Encounter: Payer: Self-pay | Admitting: *Deleted

## 2014-06-20 ENCOUNTER — Ambulatory Visit
Admit: 2014-06-20 | Disposition: A | Discharge status: Home / Self Care | Payer: Self-pay | Attending: Urgent Care | Admitting: Urgent Care

## 2014-06-25 ENCOUNTER — Encounter: Payer: Self-pay | Admitting: General Surgery

## 2014-06-25 ENCOUNTER — Ambulatory Visit (INDEPENDENT_AMBULATORY_CARE_PROVIDER_SITE_OTHER): Payer: PRIVATE HEALTH INSURANCE | Admitting: General Surgery

## 2014-06-25 VITALS — Ht 68.0 in | Wt 256.0 lb

## 2014-06-25 DIAGNOSIS — R229 Localized swelling, mass and lump, unspecified: Secondary | ICD-10-CM

## 2014-06-25 DIAGNOSIS — R238 Other skin changes: Secondary | ICD-10-CM | POA: Diagnosis not present

## 2014-06-25 DIAGNOSIS — N6452 Nipple discharge: Secondary | ICD-10-CM | POA: Diagnosis not present

## 2014-06-25 NOTE — Patient Instructions (Addendum)
The patient is aware to call back for any questions or concerns. Follow up in one week for suture removal. Keep area clean
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
## 2014-06-26 DIAGNOSIS — N6452 Nipple discharge: Secondary | ICD-10-CM | POA: Insufficient documentation

## 2014-06-26 DIAGNOSIS — R238 Other skin changes: Secondary | ICD-10-CM | POA: Insufficient documentation

## 2014-06-29 ENCOUNTER — Telehealth: Payer: Self-pay | Admitting: General Surgery

## 2014-06-29 NOTE — Telephone Encounter (Signed)
The patient was notified that the biopsy results were benign. It listed a number of possibilities including contact dermatitis and seborrheic dermatitis as the tissue showed spongiform changes.  Will asked to have one of the local dermatologist with pathology report and determine if the patient needs to be seen ORIF in appropriate topical medication Be prescribed based on biopsy results alone. The patient will be contacted when this new information is available.

## 2014-07-01 ENCOUNTER — Telehealth: Payer: Self-pay | Admitting: General Surgery

## 2014-07-01 MED ORDER — NYSTATIN-TRIAMCINOLONE 100000-0.1 UNIT/GM-% EX OINT: 1.0000 "application " | TOPICAL_OINTMENT | Freq: Two times a day (BID) | CUTANEOUS | Status: DC

## 2014-07-01 NOTE — Telephone Encounter (Signed)
The pathology from her recently completed right areolar biopsy was reviewed by phone with the dermatologist. He recommended triamcinolone ointment 0.1% applied twice a day. This will be sent to her pharmacy. It is anticipated that we'll take to-3 weeks for the area of irritation on both areolas to resolve. She was notified this will not affect the nipple drainage, but should resolve the patchy areas on both areola.  We'll arrange for a follow-up examination in one month.

## 2014-07-01 NOTE — Addendum Note (Signed)
Addended by: Robert Bellow on: 07/01/2014 04:56 PM   Modules accepted: Orders

## 2014-07-02 ENCOUNTER — Ambulatory Visit (INDEPENDENT_AMBULATORY_CARE_PROVIDER_SITE_OTHER): Payer: Self-pay | Admitting: *Deleted

## 2014-07-02 DIAGNOSIS — R229 Localized swelling, mass and lump, unspecified: Secondary | ICD-10-CM

## 2014-07-02 NOTE — Addendum Note (Signed)
Addended by: Carson Myrtle on: 07/02/2014 08:25 AM   Modules accepted: Level of Service

## 2014-07-02 NOTE — Progress Notes (Signed)
Patient came in today for a wound check/suture removal.  The wound is clean, with no signs of infection noted. Follow up as scheduled.

## 2014-07-02 NOTE — Patient Instructions (Signed)
The patient is aware to call back for any questions or concerns.  

## 2014-08-06 ENCOUNTER — Encounter: Payer: Self-pay | Admitting: General Surgery

## 2014-08-06 ENCOUNTER — Ambulatory Visit (INDEPENDENT_AMBULATORY_CARE_PROVIDER_SITE_OTHER): Payer: PRIVATE HEALTH INSURANCE | Admitting: General Surgery

## 2014-08-06 VITALS — BP 138/76 | HR 68 | Resp 12 | Ht 67.0 in | Wt 260.0 lb

## 2014-08-06 DIAGNOSIS — N6452 Nipple discharge: Secondary | ICD-10-CM | POA: Diagnosis not present

## 2014-08-06 DIAGNOSIS — R229 Localized swelling, mass and lump, unspecified: Secondary | ICD-10-CM | POA: Diagnosis not present

## 2014-08-06 DIAGNOSIS — L309 Dermatitis, unspecified: Secondary | ICD-10-CM

## 2014-08-06 DIAGNOSIS — L308 Other specified dermatitis: Secondary | ICD-10-CM | POA: Insufficient documentation

## 2014-08-06 NOTE — Progress Notes (Signed)
Patient ID: Katherine Estes, female   DOB: 07-Apr-1960, 54 y.o.   MRN: [id number]  Chief Complaint  Patient presents with  . Follow-up    One month breast aerolar biopsy    HPI Katherine Estes is a 54 y.o. female here for one month follow up right areolar biopsy done on 06/29/14. Patient reports she used Cortizone and neosporin for rash on both breast, she did not fill prescription stating it was too expensive ( $145). She reports area has resolved and doing well. Nipple drainage persists.  HPI  Past Medical History  Diagnosis Date  . Allergy   . Anemia   . Arthritis   . Asthma   . Hypertension   . Thyroid disease   . Osteoporosis     Past Surgical History  Procedure Laterality Date  . Cesarean section    . Tonsillectomy    . Bladder surgery      BLADDER LIFTED  . Thyroidectomy    . Breast surgery      LUMP EXCISION LEFT BREAST.  Marland Kitchen Wisdom tooth extraction      Family History  Problem Relation Age of Onset  . Arthritis Mother   . Asthma Mother   . Hypertension Mother   . Cancer Maternal Grandmother     THYRIOD    Social History History  Substance Use Topics  . Smoking status: Former Smoker    Types: Cigarettes  . Smokeless tobacco: Never Used     Comment: x 39yrs ago.  . Alcohol Use: 0.0 oz/week    0 Standard drinks or equivalent per week     Comment: socially     Allergies  Allergen Reactions  . Penicillins Hives and Rash    Current Outpatient Prescriptions  Medication Sig Dispense Refill  . Albuterol Sulfate (PROAIR HFA IN) Inhale into the lungs as needed.    . Fluticasone-Salmeterol (ADVAIR) 500-50 MCG/DOSE AEPB Inhale 1 puff into the lungs every 12 (twelve) hours.      Marland Kitchen ibuprofen (ADVIL,MOTRIN) 800 MG tablet TAKE 1 TABLET (800 MG TOTAL) BY MOUTH EVERY 8 (EIGHT) HOURS AS NEEDED FOR PAIN. 30 tablet 1  . levothyroxine (SYNTHROID, LEVOTHROID) 175 MCG tablet Take 175 mcg by mouth daily.    . naproxen (NAPROSYN) 500 MG tablet Take 500 mg by mouth 2  (two) times daily with a meal.    . nystatin-triamcinolone ointment (MYCOLOG) Apply 1 application topically 2 (two) times daily. Apply a small amount to the nodular skin of the areola of both breasts twice a day. 30 g 0  . Olmesartan-Amlodipine-HCTZ (TRIBENZOR) 20-5-12.5 MG TABS Take 1 tablet by mouth daily.     No current facility-administered medications for this visit.    Review of Systems Review of Systems  Constitutional: Negative.   Respiratory: Negative.   Cardiovascular: Negative.     Blood pressure 138/76, pulse 68, resp. rate 12, height 5\' 7"  (1.702 m), weight 260 lb (117.935 kg).  Physical Exam Physical Exam  Constitutional: She is oriented to person, place, and time. She appears well-developed and well-nourished.  Cardiovascular: Normal heart sounds.   Pulmonary/Chest:    Neurological: She is alert and oriented to person, place, and time.  Skin: Skin is warm and dry.    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  Assessment    Area well healed.      Plan    The patient was encouraged to call if further issues arise. She is  aware that the nipple drainage will not be affected by the topical treatment.  The patient relates the onset of nipple drainage to the Meridia IUD inserted for irregular menses and menorrhagia in 2014.   PCP:  Bari Mantis 08/06/2014, 8:51 AM

## 2015-11-03 ENCOUNTER — Ambulatory Visit
Admission: RE | Admit: 2015-11-03 | Discharge: 2015-11-03 | Disposition: A | Discharge status: Home / Self Care | Payer: Self-pay | Source: Ambulatory Visit | Attending: Oncology | Admitting: Oncology

## 2015-11-03 ENCOUNTER — Ambulatory Visit: Payer: Self-pay | Attending: Internal Medicine | Admitting: *Deleted

## 2015-11-03 ENCOUNTER — Other Ambulatory Visit: Payer: Self-pay | Admitting: Oncology

## 2015-11-03 ENCOUNTER — Encounter (INDEPENDENT_AMBULATORY_CARE_PROVIDER_SITE_OTHER): Payer: Self-pay

## 2015-11-03 ENCOUNTER — Encounter: Payer: Self-pay | Admitting: *Deleted

## 2015-11-03 VITALS — BP 98/68 | HR 72 | Temp 98.3°F | Ht 66.54 in | Wt 262.8 lb

## 2015-11-03 DIAGNOSIS — Z0000 Encounter for general adult medical examination without abnormal findings: Secondary | ICD-10-CM

## 2015-11-03 NOTE — Patient Instructions (Signed)
Gave patient hand-out, Women Staying Healthy, Active and Well from [facility], with education on breast health, pap smears, heart and colon health. 

## 2015-11-03 NOTE — Progress Notes (Signed)
Subjective:     Patient ID: Katherine Estes, female   DOB: 19-May-1960, 55 y.o.   MRN: [id number]  HPI   Review of Systems     Objective:   Physical Exam  Pulmonary/Chest: Right breast exhibits no inverted nipple, no mass, no nipple discharge, no skin change and no tenderness. Left breast exhibits no inverted nipple, no mass, no nipple discharge, no skin change and no tenderness. Breasts are symmetrical.  Very large pendulous breast       Assessment:     55 year old 55 female presents to [facility] for clinical breast exam and mammogram only.  Clinical breast exam unremarkable.  Taught self breast awareness.  Patient has been screened for eligibility.  She does not have any insurance, Medicare or Medicaid.  She also meets financial eligibility.  Hand-out given on the Affordable Care Act.    Plan:     Screening mammogram ordered.  To follow-up per [facility] protocol.

## 2015-11-05 ENCOUNTER — Encounter: Payer: Self-pay | Admitting: *Deleted

## 2015-11-05 NOTE — Progress Notes (Signed)
Letter mailed from the [facility] to inform patient of her normal mammogram results.  Patient is to follow-up with annual screening in one year.  HSIS to Christy. 

## 2016-05-10 ENCOUNTER — Encounter (HOSPITAL_COMMUNITY): Payer: Self-pay

## 2016-05-10 ENCOUNTER — Emergency Department (HOSPITAL_COMMUNITY)
Admission: EM | Admit: 2016-05-10 | Discharge: 2016-05-10 | Disposition: A | Discharge status: Home / Self Care | Payer: Self-pay | Attending: Emergency Medicine | Admitting: Emergency Medicine

## 2016-05-10 DIAGNOSIS — Z87891 Personal history of nicotine dependence: Secondary | ICD-10-CM | POA: Insufficient documentation

## 2016-05-10 DIAGNOSIS — I10 Essential (primary) hypertension: Secondary | ICD-10-CM | POA: Insufficient documentation

## 2016-05-10 DIAGNOSIS — J45909 Unspecified asthma, uncomplicated: Secondary | ICD-10-CM | POA: Insufficient documentation

## 2016-05-10 DIAGNOSIS — E039 Hypothyroidism, unspecified: Secondary | ICD-10-CM | POA: Insufficient documentation

## 2016-05-10 DIAGNOSIS — Z79899 Other long term (current) drug therapy: Secondary | ICD-10-CM | POA: Insufficient documentation

## 2016-05-10 DIAGNOSIS — R42 Dizziness and giddiness: Secondary | ICD-10-CM | POA: Insufficient documentation

## 2016-05-10 MED ORDER — ONDANSETRON HCL 4 MG/2ML IJ SOLN
4.0000 mg | Freq: Once | INTRAMUSCULAR | Status: AC
  Administered 2016-05-10: 4 mg via INTRAVENOUS
  Filled 2016-05-10: qty 2

## 2016-05-10 MED ORDER — ONDANSETRON 4 MG PO TBDP: 4.0000 mg | ORAL_TABLET | Freq: Three times a day (TID) | ORAL | 0 refills | Status: DC | PRN

## 2016-05-10 MED ORDER — MECLIZINE HCL 25 MG PO TABS: 25.0000 mg | ORAL_TABLET | Freq: Three times a day (TID) | ORAL | 0 refills | Status: DC | PRN

## 2016-05-10 MED ORDER — MECLIZINE HCL 25 MG PO TABS
25.0000 mg | ORAL_TABLET | Freq: Once | ORAL | Status: AC
  Administered 2016-05-10: 25 mg via ORAL
  Filled 2016-05-10: qty 1

## 2016-05-10 MED ORDER — DIPHENHYDRAMINE HCL 50 MG/ML IJ SOLN
25.0000 mg | Freq: Once | INTRAMUSCULAR | Status: AC
  Administered 2016-05-10: 25 mg via INTRAVENOUS
  Filled 2016-05-10: qty 1

## 2016-05-10 NOTE — ED Triage Notes (Addendum)
PER EMS: pt reports feeling un-well for the past 4 days. Today, she took a nap, sat up around 1300 and then had sudden onset of nausea, diaphoresis and dizziness "the room was spinning." Dizziness gets worse when moving head up and to the right side. A&Ox4. Non-productive dry heaves en route with EMS. She does also reports LLQ abdominal pain as well as diarrhea. 4mg  of zofran given IV by EMS

## 2016-05-10 NOTE — Discharge Instructions (Signed)
No driving until 24 hours without vertigo. Take meclizine until 24 hours without symptoms. Zofran as needed for nausea

## 2016-05-10 NOTE — ED Notes (Signed)
ED Provider at bedside. 

## 2016-05-10 NOTE — ED Provider Notes (Signed)
[facility] DEPT Provider Note   CSN: [id number] Arrival date & time: 05/10/16  1423     History   Chief Complaint Chief Complaint  Patient presents with  . Dizziness    HPI Katherine Estes is a 56 y.o. female. CC:  Dizziness and vomiting  HPI:  Patient presents for evaluation of dizziness. She had a cold with cough and runny nose and congestion last week but seemed to improve. Was in her normal state of health last night and this morning until sitting up in bed. She developed spinning sensation and nausea. She attempted to go to work but had worsening symptoms. Continues with dizziness with head movements or body movements and frequent nausea and vomiting. No headache. No additional neurological symptoms. No vision change. No extremity weakness, or numbness. No strike to the head. No medication changes.  Past similar episodes. No history of vertigo.  Past Medical History:  Diagnosis Date  . Allergy   . Anemia   . Arthritis   . Asthma   . Hypertension   . Osteoporosis   . Thyroid disease     Patient Active Problem List   Diagnosis Date Noted  . 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 Nipple discharge 06/26/2014  . Papule of skin 06/26/2014  . Cervical high risk HPV (human papillomavirus) test positive 03/29/2013  . Hypothyroid 03/27/2013  . Hypertension 03/27/2013  . Migraine headache 03/27/2013  . Asthma, moderate persistent 03/27/2013  . Allergic rhinitis 03/27/2013  . Irregular menses 12/24/2010  . Fibroids 12/24/2010    Past Surgical History:  Procedure Laterality Date  . BLADDER SURGERY     BLADDER LIFTED  . BREAST BIOPSY Left    neg  . BREAST SURGERY     LUMP EXCISION LEFT BREAST.  Marland Kitchen CESAREAN SECTION    . THYROIDECTOMY    . TONSILLECTOMY    . WISDOM TOOTH EXTRACTION      OB History    Gravida Para Term Preterm AB Living   4         2   SAB TAB Ectopic Multiple Live Births                  Obstetric Comments   1st Menstrual Cycle:  14 1st  Pregnancy:  17        Home Medications    Prior to Admission medications   Medication Sig Start Date End Date Taking? Authorizing Provider  Albuterol Sulfate (PROAIR HFA IN) Inhale into the lungs as needed.   Yes Historical Provider, MD  amLODipine (NORVASC) 5 MG tablet Take 5 mg by mouth daily.   Yes Historical Provider, MD  cholecalciferol (VITAMIN D) 1000 units tablet Take 1,000 Units by mouth daily.   Yes Historical Provider, MD  Fluticasone-Salmeterol (ADVAIR) 500-50 MCG/DOSE AEPB Inhale 1 puff into the lungs every 12 (twelve) hours.     Yes Historical Provider, MD  hydrochlorothiazide (HYDRODIURIL) 25 MG tablet Take 25 mg by mouth daily.   Yes Historical Provider, MD  ibuprofen (ADVIL,MOTRIN) 800 MG tablet TAKE 1 TABLET (800 MG TOTAL) BY MOUTH EVERY 8 (EIGHT) HOURS AS NEEDED FOR PAIN. 03/16/13  Yes Peggy Constant, MD  levothyroxine (SYNTHROID, LEVOTHROID) 200 MCG tablet Take 200 mcg by mouth daily before breakfast.   Yes Historical Provider, MD  losartan (COZAAR) 50 MG tablet Take 50 mg by mouth daily.   Yes Historical Provider, MD  metFORMIN (GLUCOPHAGE) 500 MG tablet Take 500 mg by mouth 2 (two) times daily with a meal.  Yes Historical Provider, MD  montelukast (SINGULAIR) 10 MG tablet Take 10 mg by mouth at bedtime.   Yes Historical Provider, MD  naproxen (NAPROSYN) 500 MG tablet Take 500 mg by mouth 2 (two) times daily with a meal.   Yes Historical Provider, MD  nystatin-triamcinolone ointment (MYCOLOG) Apply 1 application topically 2 (two) times daily. Apply a small amount to the nodular skin of the areola of both breasts twice a day. 07/01/14  Yes Robert Bellow, MD  Olmesartan-Amlodipine-HCTZ [facility]) 20-5-12.5 MG TABS Take 1 tablet by mouth daily.   Yes Historical Provider, MD  potassium chloride (MICRO-K) 10 MEQ CR capsule Take 10 mEq by mouth daily.   Yes Historical Provider, MD  sertraline (ZOLOFT) 50 MG tablet Take 50 mg by mouth daily.   Yes Historical Provider, MD    levothyroxine (SYNTHROID, LEVOTHROID) 175 MCG tablet Take 175 mcg by mouth daily.    Historical Provider, MD  meclizine (ANTIVERT) 25 MG tablet Take 1 tablet (25 mg total) by mouth 3 (three) times daily as needed for dizziness. Take until 24 hours without dizziness 05/10/16   Tanna Furry, MD  ondansetron (ZOFRAN ODT) 4 MG disintegrating tablet Take 1 tablet (4 mg total) by mouth every 8 (eight) hours as needed for nausea. 05/10/16   Tanna Furry, MD    Family History Family History  Problem Relation Age of Onset  . Arthritis Mother   . Asthma Mother   . Hypertension Mother   . Cancer Maternal Grandmother     THYRIOD  . Breast cancer Neg Hx     Social History Social History  Substance Use Topics  . Smoking status: Former Smoker    Types: Cigarettes  . Smokeless tobacco: Never Used     Comment: x 75yrs ago.  . Alcohol use 0.0 oz/week     Comment: socially      Allergies   Cinnamon and Penicillins   Review of Systems Review of Systems  Constitutional: Negative for appetite change, chills, diaphoresis, fatigue and fever.  HENT: Negative for mouth sores, sore throat and trouble swallowing.   Eyes: Negative for visual disturbance.  Respiratory: Negative for cough, chest tightness, shortness of breath and wheezing.   Cardiovascular: Negative for chest pain.  Gastrointestinal: Positive for nausea and vomiting. Negative for abdominal distention, abdominal pain and diarrhea.  Endocrine: Negative for polydipsia, polyphagia and polyuria.  Genitourinary: Negative for dysuria, frequency and hematuria.  Musculoskeletal: Negative for gait problem.  Skin: Negative for color change, pallor and rash.  Neurological: Positive for dizziness. Negative for syncope, light-headedness and headaches.  Hematological: Does not bruise/bleed easily.  Psychiatric/Behavioral: Negative for behavioral problems and confusion.     Physical Exam Updated Vital Signs BP 121/71   Pulse 71   Temp 97.9 F  (36.6 C) (Oral)   Resp 16   SpO2 98%   Physical Exam  Constitutional: She is oriented to person, place, and time. She appears well-developed and well-nourished. No distress.  HENT:  Head: Normocephalic.  Nystagmus with lateral gaze. Symptomatic with head movements. Cranial nerves otherwise intact.  Eyes: Conjunctivae are normal. Pupils are equal, round, and reactive to light. No scleral icterus.  Neck: Normal range of motion. Neck supple. No thyromegaly present.  Cardiovascular: Normal rate and regular rhythm.  Exam reveals no gallop and no friction rub.   No murmur heard. Pulmonary/Chest: Effort normal and breath sounds normal. No respiratory distress. She has no wheezes. She has no rales.  Abdominal: Soft. Bowel sounds are normal. She  exhibits no distension. There is no tenderness. There is no rebound.  Musculoskeletal: Normal range of motion.  Neurological: She is alert and oriented to person, place, and time.  Skin: Skin is warm and dry. No rash noted.  Psychiatric: She has a normal mood and affect. Her behavior is normal.     ED Treatments / Results  Labs (all labs ordered are listed, but only abnormal results are displayed) Labs Reviewed - No data to display  EKG  EKG Interpretation None       Radiology No results found.  Procedures Procedures (including critical care time)  Medications Ordered in ED Medications  ondansetron (ZOFRAN) injection 4 mg (4 mg Intravenous Given 05/10/16 1529)  diphenhydrAMINE (BENADRYL) injection 25 mg (25 mg Intravenous Given 05/10/16 1529)  meclizine (ANTIVERT) tablet 25 mg (25 mg Oral Given 05/10/16 1529)     Initial Impression / Assessment and Plan / ED Course  I have reviewed the triage vital signs and the nursing notes.  Pertinent labs & imaging results that were available during my care of the patient were reviewed by me and considered in my medical decision making (see chart for details).     Review of IV Zofran and  Benadryl. By mouth meclizine. We'll reassess. Symptoms findings consistent with labyrinthitis or acute peripheral vertigo. No symptoms or findings to suggest central cause.  Final Clinical Impressions(s) / ED Diagnoses   Final diagnoses:  Vertigo    Patient improving on medications. Plan discharge home with when necessary Zofran and meclizine. No driving until 24 hours without symptoms. Take meclizine until 24 hours without symptoms. ENT follow-up if not improving.  New Prescriptions New Prescriptions   MECLIZINE (ANTIVERT) 25 MG TABLET    Take 1 tablet (25 mg total) by mouth 3 (three) times daily as needed for dizziness. Take until 24 hours without dizziness   ONDANSETRON (ZOFRAN ODT) 4 MG DISINTEGRATING TABLET    Take 1 tablet (4 mg total) by mouth every 8 (eight) hours as needed for nausea.     Tanna Furry, MD 05/10/16 [phone]

## 2016-05-10 NOTE — ED Notes (Signed)
Pt unable to sign due to computer malfunction. Pt verbalized discharge instructions.

## 2017-05-26 DIAGNOSIS — R7881 Bacteremia: Secondary | ICD-10-CM | POA: Insufficient documentation

## 2017-05-29 DIAGNOSIS — A419 Sepsis, unspecified organism: Secondary | ICD-10-CM | POA: Insufficient documentation

## 2017-11-08 ENCOUNTER — Ambulatory Visit: Payer: Self-pay

## 2018-03-01 ENCOUNTER — Encounter: Payer: Medicaid Other | Admitting: Advanced Practice Midwife

## 2018-03-27 ENCOUNTER — Emergency Department: Payer: Self-pay

## 2018-03-27 ENCOUNTER — Emergency Department
Admission: EM | Admit: 2018-03-27 | Discharge: 2018-03-28 | Disposition: A | Discharge status: Home / Self Care | Payer: Self-pay | Attending: Emergency Medicine | Admitting: Emergency Medicine

## 2018-03-27 ENCOUNTER — Other Ambulatory Visit: Payer: Self-pay

## 2018-03-27 DIAGNOSIS — Z87891 Personal history of nicotine dependence: Secondary | ICD-10-CM | POA: Insufficient documentation

## 2018-03-27 DIAGNOSIS — M7532 Calcific tendinitis of left shoulder: Secondary | ICD-10-CM | POA: Insufficient documentation

## 2018-03-27 DIAGNOSIS — I10 Essential (primary) hypertension: Secondary | ICD-10-CM | POA: Insufficient documentation

## 2018-03-27 DIAGNOSIS — M25512 Pain in left shoulder: Secondary | ICD-10-CM

## 2018-03-27 DIAGNOSIS — M6520 Calcific tendinitis, unspecified site: Secondary | ICD-10-CM

## 2018-03-27 DIAGNOSIS — J45909 Unspecified asthma, uncomplicated: Secondary | ICD-10-CM | POA: Insufficient documentation

## 2018-03-27 DIAGNOSIS — R079 Chest pain, unspecified: Secondary | ICD-10-CM | POA: Insufficient documentation

## 2018-03-27 DIAGNOSIS — Z79899 Other long term (current) drug therapy: Secondary | ICD-10-CM | POA: Insufficient documentation

## 2018-03-27 DIAGNOSIS — R52 Pain, unspecified: Secondary | ICD-10-CM

## 2018-03-27 DIAGNOSIS — Z7984 Long term (current) use of oral hypoglycemic drugs: Secondary | ICD-10-CM | POA: Insufficient documentation

## 2018-03-27 LAB — CBC
HEMATOCRIT: 39.1 % (ref 36.0–46.0)
Hemoglobin: 12.2 g/dL (ref 12.0–15.0)
MCH: 28.6 pg (ref 26.0–34.0)
MCHC: 31.2 g/dL (ref 30.0–36.0)
MCV: 91.6 fL (ref 80.0–100.0)
Platelets: 310 10*3/uL (ref 150–400)
RBC: 4.27 MIL/uL (ref 3.87–5.11)
RDW: 13.5 % (ref 11.5–15.5)
WBC: 12.6 10*3/uL — AB (ref 4.0–10.5)
nRBC: 0 % (ref 0.0–0.2)

## 2018-03-27 LAB — BASIC METABOLIC PANEL
Anion gap: 10 (ref 5–15)
BUN: 27 mg/dL — AB (ref 6–20)
CO2: 27 mmol/L (ref 22–32)
CREATININE: 1.06 mg/dL — AB (ref 0.44–1.00)
Calcium: 8.8 mg/dL — ABNORMAL LOW (ref 8.9–10.3)
Chloride: 101 mmol/L (ref 98–111)
GFR calc Af Amer: >60 mL/min (ref >60)
GFR, EST NON AFRICAN AMERICAN: 59 mL/min — AB (ref >60)
Glucose, Bld: 112 mg/dL — ABNORMAL HIGH (ref 70–99)
Potassium: 3 mmol/L — ABNORMAL LOW (ref 3.5–5.1)
SODIUM: 138 mmol/L (ref 135–145)

## 2018-03-27 LAB — POCT PREGNANCY, URINE: PREG TEST UR: NEGATIVE

## 2018-03-27 LAB — TROPONIN I: Troponin I: <0.03 ng/mL (ref <0.03)

## 2018-03-27 MED ORDER — KETOROLAC TROMETHAMINE 30 MG/ML IJ SOLN
30.0000 mg | Freq: Once | INTRAMUSCULAR | Status: AC
  Administered 2018-03-27: 30 mg via INTRAVENOUS
  Filled 2018-03-27: qty 1

## 2018-03-27 MED ORDER — ONDANSETRON HCL 4 MG/2ML IJ SOLN
4.0000 mg | Freq: Once | INTRAMUSCULAR | Status: AC
  Administered 2018-03-27: 4 mg via INTRAVENOUS
  Filled 2018-03-27: qty 2

## 2018-03-27 MED ORDER — HYDROMORPHONE HCL 1 MG/ML IJ SOLN
0.5000 mg | Freq: Once | INTRAMUSCULAR | Status: AC
  Administered 2018-03-27: 0.5 mg via INTRAVENOUS
  Filled 2018-03-27: qty 1

## 2018-03-27 MED ORDER — BUPIVACAINE HCL 0.25 % IJ SOLN
10.0000 mL | Freq: Once | INTRAMUSCULAR | Status: AC
  Administered 2018-03-28: 10 mL

## 2018-03-27 MED ORDER — BUPIVACAINE HCL 0.5 % IJ SOLN: 50.0000 mL | Freq: Once | INTRAMUSCULAR | Status: DC

## 2018-03-27 MED ORDER — LIDOCAINE HCL (PF) 1 % IJ SOLN
5.0000 mL | Freq: Once | INTRAMUSCULAR | Status: AC
  Administered 2018-03-28: 5 mL via INTRADERMAL

## 2018-03-27 MED ORDER — LIDOCAINE HCL (PF) 1 % IJ SOLN
INTRAMUSCULAR | Status: AC
  Administered 2018-03-28: 5 mL via INTRADERMAL
  Filled 2018-03-27: qty 5

## 2018-03-27 NOTE — ED Notes (Signed)
Patient transported to X-ray 

## 2018-03-27 NOTE — ED Provider Notes (Signed)
[facility] Emergency Department Provider Note   ____________________________________________   First MD Initiated Contact with Patient 03/27/18 2159     (approximate)  I have reviewed the triage vital signs and the nursing notes.   HISTORY  Chief Complaint Chest Pain   HPI Katherine Estes is a 58 y.o. female who reports several days of pain in the left shoulder that radiates to the neck and chest.  The pain is worse with movement of the shoulder.  This morning when she got up it was so bad that she could not move her shoulder at all.  She is gone through the day without being able to move her shoulder because it hurts too much.  She has normal strength in the wrist and hand, normal sensation in the arm.  The only finding she has is pain in the shoulder on palpation.  Palpation there exactly reproduces her pain and exactly reproduces the radiation.  Pain is severe.  Past Medical History:  Diagnosis Date  . Allergy   . Anemia   . Arthritis   . Asthma   . Hypertension   . Osteoporosis   . Thyroid disease     Patient Active Problem List   Diagnosis Date Noted  . 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 Nipple discharge 06/26/2014  . Papule of skin 06/26/2014  . Cervical high risk HPV (human papillomavirus) test positive 03/29/2013  . Hypothyroid 03/27/2013  . Hypertension 03/27/2013  . Migraine headache 03/27/2013  . Asthma, moderate persistent 03/27/2013  . Allergic rhinitis 03/27/2013  . Irregular menses 12/24/2010  . Fibroids 12/24/2010    Past Surgical History:  Procedure Laterality Date  . BLADDER SURGERY     BLADDER LIFTED  . BREAST BIOPSY Left    neg  . BREAST SURGERY     LUMP EXCISION LEFT BREAST.  Marland Kitchen CESAREAN SECTION    . THYROIDECTOMY    . TONSILLECTOMY    . WISDOM TOOTH EXTRACTION      Prior to Admission medications   Medication Sig Start Date End Date Taking? Authorizing Provider  Albuterol Sulfate (PROAIR HFA IN) Inhale  into the lungs as needed.    [provider]  amLODipine (NORVASC) 5 MG tablet Take 5 mg by mouth daily.    [provider]  cholecalciferol (VITAMIN D) 1000 units tablet Take 1,000 Units by mouth daily.    [provider]  Fluticasone-Salmeterol (ADVAIR) 500-50 MCG/DOSE AEPB Inhale 1 puff into the lungs every 12 (twelve) hours.      [provider]  hydrochlorothiazide (HYDRODIURIL) 25 MG tablet Take 25 mg by mouth daily.    [provider]  ibuprofen (ADVIL,MOTRIN) 800 MG tablet TAKE 1 TABLET (800 MG TOTAL) BY MOUTH EVERY 8 (EIGHT) HOURS AS NEEDED FOR PAIN. 03/16/13   Constant, Peggy, MD  levothyroxine (SYNTHROID, LEVOTHROID) 175 MCG tablet Take 175 mcg by mouth daily.    [provider]  levothyroxine (SYNTHROID, LEVOTHROID) 200 MCG tablet Take 200 mcg by mouth daily before breakfast.    [provider]  losartan (COZAAR) 50 MG tablet Take 50 mg by mouth daily.    [provider]  meclizine (ANTIVERT) 25 MG tablet Take 1 tablet (25 mg total) by mouth 3 (three) times daily as needed for dizziness. Take until 24 hours without dizziness 05/10/16   Tanna Furry, MD  metFORMIN (GLUCOPHAGE) 500 MG tablet Take 500 mg by mouth 2 (two) times daily with a meal.    [provider]  montelukast (SINGULAIR) 10 MG tablet Take 10 mg by mouth at bedtime.    [provider]  naproxen (NAPROSYN) 500 MG tablet Take 500 mg by mouth 2 (two) times daily with a meal.    [provider]  nystatin-triamcinolone ointment (MYCOLOG) Apply 1 application topically 2 (two) times daily. Apply a small amount to the nodular skin of the areola of both breasts twice a day. 07/01/14   Robert Bellow, MD  Olmesartan-Amlodipine-HCTZ (TRIBENZOR) 20-5-12.5 MG TABS Take 1 tablet by mouth daily.    [provider]  ondansetron (ZOFRAN ODT) 4 MG disintegrating tablet Take 1 tablet (4 mg total) by mouth every 8 (eight) hours as needed  for nausea. 05/10/16   Tanna Furry, MD  potassium chloride (MICRO-K) 10 MEQ CR capsule Take 10 mEq by mouth daily.    [provider]  sertraline (ZOLOFT) 50 MG tablet Take 50 mg by mouth daily.    [provider]    Allergies Cinnamon and Penicillins  Family History  Problem Relation Age of Onset  . Arthritis Mother   . Asthma Mother   . Hypertension Mother   . Cancer Maternal Grandmother        THYRIOD  . Breast cancer Neg Hx     Social History Social History   Tobacco Use  . Smoking status: Former Smoker    Types: Cigarettes  . Smokeless tobacco: Never Used  . Tobacco comment: x 57yrs ago.  Substance Use Topics  . Alcohol use: Yes    Alcohol/week: 0.0 standard drinks    Comment: socially   . Drug use: No    Review of Systems  Constitutional: No fever/chills Eyes: No visual changes. ENT: No sore throat. Cardiovascular: Denies chest pain. Respiratory: Denies shortness of breath. Gastrointestinal: No abdominal pain.  No nausea, no vomiting.  No diarrhea.  No constipation. Genitourinary: Negative for dysuria. Musculoskeletal: Negative for back pain. Skin: Negative for rash. Neurological: Negative for headaches, focal weakness except the shoulder due to the pain  ____________________________________________   PHYSICAL EXAM:  VITAL SIGNS: ED Triage Vitals  Enc Vitals Group     BP 03/27/18 1920 113/78     Pulse Rate 03/27/18 1920 (!) 107     Resp 03/27/18 1920 18     Temp 03/27/18 1920 98.7 F (37.1 C)     Temp Source 03/27/18 1920 Oral     SpO2 03/27/18 1920 99 %     Weight 03/27/18 1921 257 lb (116.6 kg)     Height 03/27/18 1921 5\' 7"  (1.702 m)     Head Circumference --      Peak Flow --      Pain Score 03/27/18 1921 10     Pain Loc --      Pain Edu? --      Excl. in [place]? --     Constitutional: Alert and oriented.  Holding her shoulder immobile Eyes: Conjunctivae are normal.  Head: Atraumatic. Nose: No  congestion/rhinnorhea. Mouth/Throat: Mucous membranes are moist.  Oropharynx non-erythematous. Neck: No stridor.   Cardiovascular: Normal rate, regular rhythm. Grossly normal heart sounds.  Good peripheral circulation. Respiratory: Normal respiratory effort.  No retractions. Lungs CTAB. Gastrointestinal: Soft and nontender. No distention. No abdominal bruits. No CVA tenderness. Musculoskeletal: No lower extremity tenderness nor edema.  Shoulder tender as described in HPI Neurologic:  Normal speech and language. No gross focal neurologic deficits are appreciated. No gait instability. Skin:  Skin is warm, dry and intact. No rash noted.  Psychiatric: Mood and affect are normal. Speech and behavior are normal.  ____________________________________________   LABS (all labs ordered are listed, but only abnormal results are displayed)  Labs Reviewed  BASIC METABOLIC PANEL - Abnormal; Notable for the following components:      Result Value   Potassium 3.0 (*)    Glucose, Bld 112 (*)    BUN 27 (*)    Creatinine, Ser 1.06 (*)    Calcium 8.8 (*)    GFR calc non Af Amer 59 (*)    All other components within normal limits  CBC - Abnormal; Notable for the following components:   WBC 12.6 (*)    All other components within normal limits  TROPONIN I  POCT PREGNANCY, URINE  POC URINE PREG, ED   ____________________________________________  EKG  EKG read and interpreted by me shows sinus tachycardia rate of 104 left axis left anterior hemiblock irregular baseline no obvious acute changes ____________________________________________  RADIOLOGY  ED MD interpretation: X-ray read by radiology reviewed by me shows no acute disease the left shoulder was not pleat Lee included in the picture so I will x-ray it  Shoulder x-ray consistent with calcific tendinitis      Official radiology report(s): Dg Chest 1 View  Result Date: 03/27/2018 CLINICAL DATA:  Chest pain EXAM: CHEST  1 VIEW COMPARISON:   None. FINDINGS: The heart size and mediastinal contours are within normal limits. Both lungs are clear. The visualized skeletal structures are unremarkable. IMPRESSION: No active disease. Electronically Signed   By: Donavan Foil M.D.   On: 03/27/2018 19:42   Dg Shoulder Left  Result Date: 03/28/2018 CLINICAL DATA:  Left shoulder pain. No known injury. EXAM: LEFT SHOULDER - 2+ VIEW COMPARISON:  None. FINDINGS: There is no evidence of fracture or dislocation. Minimal degenerative inferior glenoid spurring. Rounded soft tissue calcification adjacent to the lateral humeral head. IMPRESSION: 1. Rounded soft tissue calcification adjacent to the lateral humeral head likely calcific tendinitis. 2. Minimal degenerative change of the glenohumeral joint. Electronically Signed   By: Keith Rake M.D.   On: 03/28/2018 00:35    ____________________________________________   PROCEDURES  Procedure(s) performed: Oral consent was obtained shoulder first cleaned with Betadine and then 10 alcohol wipes and then 4 cc of lidocaine mixed with 1 cc bupivacaine were injected just under the acromion posteriorly in the area where most people have dimples in the shoulder.  Patient tolerated this very well and afterwards was able to move the shoulder better although still could not abduct it without a lot of pain.  Procedures  Critical Care performed:   ____________________________________________   INITIAL IMPRESSION / ASSESSMENT AND PLAN / ED COURSE  X-rays and patient's exam and history are most consistent with calcific tendinitis.  Discussed the patient with Dr. Mack Guise although at the time I did that I did have a shoulder x-rays back.  He will follow-up in the office.  The meantime we will give the patient a sling to help her with the pain.  I discussed the importance of moving the shoulder at least 4 5 times a day bending over and moving the arm in a circle or walking the hand up the wall to move the shoulder.   I advised her about the risks of frozen shoulder.  She will try some Vicodin and Motrin 3 of the over-the-counter pills 3 times a day for about 3 days.  I do not want to use more as her GFR while normal is not over  86 for non-African-American.         ____________________________________________   FINAL CLINICAL IMPRESSION(S) / ED DIAGNOSES  Final diagnoses:  Calcific tendinitis  Acute pain of left shoulder     ED Discharge Orders    None       Note:  This document was prepared using Dragon voice recognition software and may include unintentional dictation errors.    Nena Polio, MD 03/28/18 0200

## 2018-03-27 NOTE — ED Triage Notes (Signed)
Pt arrives to ED via [facility] from Urgent care with c/o left arm pain with radiation into the chest since Saturday. Pt denies any known fall, injury, or trauma. Pt denies any unilateral numbness or weakness, no facial droop, trouble speaking or swallowing. Pt denies N/V/D or fever. No previous cardiac h/x or DVTs. Pt reports SHOB "only when laying flat". Pt is A&O, in NAD; RR even, regular, and unlabored.

## 2018-03-28 MED ORDER — HYDROCODONE-ACETAMINOPHEN 5-325MG PREPACK (~~LOC~~: 1.0000 | ORAL_TABLET | Freq: Four times a day (QID) | ORAL | 0 refills | Status: DC | PRN

## 2018-03-28 NOTE — Discharge Instructions (Signed)
Take 3 of the over-the-counter Motrin 3 times a day with food for about 3 days.  Also you can use the Vicodin 1 or 2 pills 4 times a day as needed for pain.  Please give Dr. Mack Guise the orthopedic surgeon a call in the morning.  Let him know you were in the ER with severe shoulder pain and seem to have calcific tendinitis.  Let him know that the emergency medicine doctor was worried about getting a frozen shoulder.  He will see you as soon as he can.  Please return for any increasing pain fever redness or swelling or any other problems.

## 2018-08-02 ENCOUNTER — Ambulatory Visit: Payer: Medicaid Other

## 2019-05-19 ENCOUNTER — Ambulatory Visit: Payer: Medicaid Other | Attending: Internal Medicine

## 2019-05-19 DIAGNOSIS — Z23 Encounter for immunization: Secondary | ICD-10-CM

## 2019-05-19 NOTE — Progress Notes (Signed)
   Covid-19 Vaccination Clinic  Name:  Katherine Estes    MRN: [id number] DOB: 03-23-1960  05/19/2019  Katherine Estes was observed post Covid-19 immunization for 15 minutes without incidence. She was provided with Vaccine Information Sheet and instruction to access the V-Safe system.   Katherine Estes was instructed to call 911 with any severe reactions post vaccine: Marland Kitchen Difficulty breathing  . Swelling of your face and throat  . A fast heartbeat  . A bad rash all over your body  . Dizziness and weakness    Immunizations Administered    Name Date Dose VIS Date Route   Moderna COVID-19 Vaccine 05/19/2019  1:44 PM 0.5 mL 02/20/2019 Intramuscular   Manufacturer: Moderna   Lot: [id number]   [id number]

## 2019-06-13 DIAGNOSIS — N200 Calculus of kidney: Secondary | ICD-10-CM | POA: Insufficient documentation

## 2019-06-16 ENCOUNTER — Ambulatory Visit: Payer: Medicaid Other | Attending: Internal Medicine

## 2019-06-16 DIAGNOSIS — Z23 Encounter for immunization: Secondary | ICD-10-CM

## 2019-06-16 NOTE — Progress Notes (Signed)
   Covid-19 Vaccination Clinic  Name:  Katherine Estes    MRN: [id number] DOB: 1960/07/23  06/16/2019  [name] was observed post Covid-19 immunization for 15 minutes without incident. She was provided with Vaccine Information Sheet and instruction to access the V-Safe system.   [name] was instructed to call 911 with any severe reactions post vaccine: Marland Kitchen Difficulty breathing  . Swelling of face and throat  . A fast heartbeat  . A bad rash all over body  . Dizziness and weakness   Immunizations Administered    Name Date Dose VIS Date Route   Moderna COVID-19 Vaccine 06/16/2019  9:54 AM 0.5 mL 02/20/2019 Intramuscular   Manufacturer: Moderna   Lot: [id number]   [id number]

## 2020-01-23 ENCOUNTER — Other Ambulatory Visit: Payer: Self-pay | Admitting: Physician Assistant

## 2020-01-23 DIAGNOSIS — Z1231 Encounter for screening mammogram for malignant neoplasm of breast: Secondary | ICD-10-CM

## 2020-02-26 ENCOUNTER — Other Ambulatory Visit: Payer: Self-pay

## 2020-02-26 ENCOUNTER — Telehealth (INDEPENDENT_AMBULATORY_CARE_PROVIDER_SITE_OTHER): Payer: Self-pay | Admitting: Gastroenterology

## 2020-02-26 DIAGNOSIS — Z1211 Encounter for screening for malignant neoplasm of colon: Secondary | ICD-10-CM

## 2020-02-26 MED ORDER — NA SULFATE-K SULFATE-MG SULF 17.5-3.13-1.6 GM/177ML PO SOLN: 1.0000 | Freq: Once | ORAL | 0 refills | Status: AC

## 2020-02-26 NOTE — Progress Notes (Signed)
Gastroenterology Pre-Procedure Review  Request Date: 04/11/20 Requesting Physician: Dr. Bonna Gains  PATIENT REVIEW QUESTIONS: The patient responded to the following health history questions as indicated:    1. Are you having any GI issues? no 2. Do you have a personal history of Polyps? no 3. Do you have a family history of Colon Cancer or Polyps? no 4. Diabetes Mellitus? yes (TYPE 2) 5. Joint replacements in the past 12 months?no 6. Major health problems in the past 3 months?yes (LITHOTRIPSY 10/04/2018) 7. Any artificial heart valves, MVP, or defibrillator?no    MEDICATIONS & ALLERGIES:    Patient reports the following regarding taking any anticoagulation/antiplatelet therapy:   Plavix, Coumadin, Eliquis, Xarelto, Lovenox, Pradaxa, Brilinta, or Effient? no Aspirin? no  Patient confirms/reports the following medications:  Current Outpatient Medications  Medication Sig Dispense Refill  . Albuterol Sulfate (PROAIR HFA IN) Inhale into the lungs as needed.    Marland Kitchen amLODipine (NORVASC) 5 MG tablet Take 5 mg by mouth daily.    . cholecalciferol (VITAMIN D) 1000 units tablet Take 1,000 Units by mouth daily.    . Fluticasone-Salmeterol (ADVAIR) 500-50 MCG/DOSE AEPB Inhale 1 puff into the lungs every 12 (twelve) hours.      . hydrochlorothiazide (HYDRODIURIL) 25 MG tablet Take 25 mg by mouth daily.    Marland Kitchen levothyroxine (SYNTHROID, LEVOTHROID) 175 MCG tablet Take 175 mcg by mouth daily.    Marland Kitchen losartan (COZAAR) 50 MG tablet Take 50 mg by mouth daily.    . metFORMIN (GLUCOPHAGE) 500 MG tablet Take 500 mg by mouth 2 (two) times daily with a meal.    . montelukast (SINGULAIR) 10 MG tablet Take 10 mg by mouth at bedtime.    . potassium chloride (MICRO-K) 10 MEQ CR capsule Take 10 mEq by mouth daily.    Marland Kitchen HYDROcodone-acetaminophen (VICODIN) 5-325 mg TABS tablet Take 6 tablets by mouth 4 (four) times daily as needed (Pain). (Patient not taking: Reported on 02/26/2020) 20 tablet 0  . ibuprofen (ADVIL,MOTRIN)  800 MG tablet TAKE 1 TABLET (800 MG TOTAL) BY MOUTH EVERY 8 (EIGHT) HOURS AS NEEDED FOR PAIN. (Patient not taking: Reported on 02/26/2020) 30 tablet 1  . levothyroxine (SYNTHROID, LEVOTHROID) 200 MCG tablet Take 200 mcg by mouth daily before breakfast. (Patient not taking: Reported on 02/26/2020)    . meclizine (ANTIVERT) 25 MG tablet Take 1 tablet (25 mg total) by mouth 3 (three) times daily as needed for dizziness. Take until 24 hours without dizziness (Patient not taking: Reported on 02/26/2020) 30 tablet 0  . Na Sulfate-K Sulfate-Mg Sulf 17.5-3.13-1.6 GM/177ML SOLN Take 1 kit by mouth once for 1 dose. 354 mL 0  . naproxen (NAPROSYN) 500 MG tablet Take 500 mg by mouth 2 (two) times daily with a meal. (Patient not taking: Reported on 02/26/2020)    . nystatin-triamcinolone ointment (MYCOLOG) Apply 1 application topically 2 (two) times daily. Apply a small amount to the nodular skin of the areola of both breasts twice a day. (Patient not taking: Reported on 02/26/2020) 30 g 0  . Olmesartan-Amlodipine-HCTZ (TRIBENZOR) 20-5-12.5 MG TABS Take 1 tablet by mouth daily. (Patient not taking: Reported on 02/26/2020)    . ondansetron (ZOFRAN ODT) 4 MG disintegrating tablet Take 1 tablet (4 mg total) by mouth every 8 (eight) hours as needed for nausea. (Patient not taking: Reported on 02/26/2020) 6 tablet 0  . sertraline (ZOLOFT) 50 MG tablet Take 50 mg by mouth daily. (Patient not taking: Reported on 02/26/2020)     No current facility-administered medications  for this visit.    Patient confirms/reports the following allergies:  Allergies  Allergen Reactions  . Cinnamon     "not good"  . Penicillins Hives and Rash    Orders Placed This Encounter  Procedures  . Procedural/ Surgical Case Request: COLONOSCOPY WITH PROPOFOL    Standing Status:   Standing    Number of Occurrences:   1    Order Specific Question:   Pre-op diagnosis    Answer:   SCREENING COLONOSCOPY    Order Specific Question:   CPT Code     Answer:   78588    AUTHORIZATION INFORMATION Primary Insurance: 1D#: Group #:  Secondary Insurance: 1D#: Group #:  SCHEDULE INFORMATION: Date: 04/11/20 Time: Location:[facility]

## 2020-04-03 ENCOUNTER — Other Ambulatory Visit: Payer: Self-pay

## 2020-04-03 ENCOUNTER — Ambulatory Visit
Admission: RE | Admit: 2020-04-03 | Discharge: 2020-04-03 | Disposition: A | Discharge status: Home / Self Care | Payer: Medicaid Other | Source: Ambulatory Visit | Attending: Physician Assistant | Admitting: Physician Assistant

## 2020-04-03 DIAGNOSIS — Z1231 Encounter for screening mammogram for malignant neoplasm of breast: Secondary | ICD-10-CM | POA: Insufficient documentation

## 2020-04-09 ENCOUNTER — Other Ambulatory Visit: Payer: Self-pay

## 2020-04-09 ENCOUNTER — Other Ambulatory Visit
Admission: RE | Admit: 2020-04-09 | Discharge: 2020-04-09 | Disposition: A | Discharge status: Home / Self Care | Payer: Medicaid Other | Source: Ambulatory Visit | Attending: Gastroenterology | Admitting: Gastroenterology

## 2020-04-09 DIAGNOSIS — Z20822 Contact with and (suspected) exposure to covid-19: Secondary | ICD-10-CM | POA: Diagnosis not present

## 2020-04-09 DIAGNOSIS — Z01818 Encounter for other preprocedural examination: Secondary | ICD-10-CM | POA: Insufficient documentation

## 2020-04-09 LAB — SARS CORONAVIRUS 2 (TAT 6-24 HRS): SARS Coronavirus 2: NEGATIVE

## 2020-04-10 ENCOUNTER — Encounter: Payer: Self-pay | Admitting: Gastroenterology

## 2020-04-11 ENCOUNTER — Encounter
Admission: RE | Disposition: A | Discharge status: Home / Self Care | Payer: Self-pay | Source: Home / Self Care | Attending: Gastroenterology

## 2020-04-11 ENCOUNTER — Ambulatory Visit
Admission: RE | Admit: 2020-04-11 | Discharge: 2020-04-11 | Disposition: A | Discharge status: Home / Self Care | Payer: Medicaid Other | Attending: Gastroenterology | Admitting: Gastroenterology

## 2020-04-11 ENCOUNTER — Ambulatory Visit: Payer: Medicaid Other | Admitting: Anesthesiology

## 2020-04-11 ENCOUNTER — Encounter: Payer: Self-pay | Admitting: Gastroenterology

## 2020-04-11 DIAGNOSIS — Z7951 Long term (current) use of inhaled steroids: Secondary | ICD-10-CM | POA: Insufficient documentation

## 2020-04-11 DIAGNOSIS — Z7984 Long term (current) use of oral hypoglycemic drugs: Secondary | ICD-10-CM | POA: Diagnosis not present

## 2020-04-11 DIAGNOSIS — Z79890 Hormone replacement therapy (postmenopausal): Secondary | ICD-10-CM | POA: Insufficient documentation

## 2020-04-11 DIAGNOSIS — Z79899 Other long term (current) drug therapy: Secondary | ICD-10-CM | POA: Insufficient documentation

## 2020-04-11 DIAGNOSIS — Z87891 Personal history of nicotine dependence: Secondary | ICD-10-CM | POA: Insufficient documentation

## 2020-04-11 DIAGNOSIS — Z1211 Encounter for screening for malignant neoplasm of colon: Secondary | ICD-10-CM | POA: Diagnosis present

## 2020-04-11 HISTORY — DX: Type 2 diabetes mellitus without complications: E11.9

## 2020-04-11 HISTORY — PX: COLONOSCOPY WITH PROPOFOL: SHX5780

## 2020-04-11 HISTORY — DX: Hypothyroidism, unspecified: E03.9

## 2020-04-11 LAB — GLUCOSE, CAPILLARY: Glucose-Capillary: 109 mg/dL — ABNORMAL HIGH (ref 70–99)

## 2020-04-11 SURGERY — COLONOSCOPY WITH PROPOFOL
Anesthesia: General

## 2020-04-11 MED ORDER — LIDOCAINE HCL (CARDIAC) PF 100 MG/5ML IV SOSY
PREFILLED_SYRINGE | INTRAVENOUS | Status: DC | PRN
  Administered 2020-04-11: 50 mg via INTRAVENOUS

## 2020-04-11 MED ORDER — PROPOFOL 10 MG/ML IV BOLUS
INTRAVENOUS | Status: DC | PRN
  Administered 2020-04-11: 70 mg via INTRAVENOUS

## 2020-04-11 MED ORDER — PROPOFOL 500 MG/50ML IV EMUL
INTRAVENOUS | Status: DC | PRN
  Administered 2020-04-11: 150 ug/kg/min via INTRAVENOUS

## 2020-04-11 MED ORDER — SODIUM CHLORIDE 0.9 % IV SOLN: INTRAVENOUS | Status: DC

## 2020-04-11 NOTE — H&P (Signed)
Jonathon Bellows, MD [address], [place], [place], [place], [address] [address], [place], [place], [place], [address] Phone: [phone]  Fax: [phone]  Primary Care Physician:  Crissie Figures, PA-C   Pre-Procedure History & Physical: HPI:  Katherine Estes is a 60 y.o. female is here for an colonoscopy.   Past Medical History:  Diagnosis Date  . Allergy   . Anemia   . Arthritis   . Asthma   . Diabetes mellitus without complication ([place])   . Hypertension   . Hypothyroidism   . Osteoporosis   . Thyroid disease     Past Surgical History:  Procedure Laterality Date  . BLADDER SURGERY     BLADDER LIFTED  . BREAST BIOPSY Left    neg  . BREAST SURGERY     LUMP EXCISION LEFT BREAST.  Marland Kitchen CESAREAN SECTION    . THYROIDECTOMY    . TONSILLECTOMY    . WISDOM TOOTH EXTRACTION      Prior to Admission medications   Medication Sig Start Date End Date Taking? Authorizing Provider  amLODipine (NORVASC) 5 MG tablet Take 5 mg by mouth daily.   Yes [provider]  hydrochlorothiazide (HYDRODIURIL) 25 MG tablet Take 25 mg by mouth daily.   Yes [provider]  levothyroxine (SYNTHROID, LEVOTHROID) 175 MCG tablet Take 175 mcg by mouth daily.   Yes [provider]  losartan (COZAAR) 50 MG tablet Take 50 mg by mouth daily.   Yes [provider]  metFORMIN (GLUCOPHAGE) 500 MG tablet Take 500 mg by mouth 2 (two) times daily with a meal.   Yes [provider]  Albuterol Sulfate (PROAIR HFA IN) Inhale into the lungs as needed.    [provider]  cholecalciferol (VITAMIN D) 1000 units tablet Take 1,000 Units by mouth daily.    [provider]  Fluticasone-Salmeterol (ADVAIR) 500-50 MCG/DOSE AEPB Inhale 1 puff into the lungs every 12 (twelve) hours.      [provider]  HYDROcodone-acetaminophen (VICODIN) 5-325 mg TABS tablet Take 6 tablets by mouth 4 (four) times daily as needed (Pain). Patient not taking:  Reported on 02/26/2020 03/28/18   Nena Polio, MD  ibuprofen (ADVIL,MOTRIN) 800 MG tablet TAKE 1 TABLET (800 MG TOTAL) BY MOUTH EVERY 8 (EIGHT) HOURS AS NEEDED FOR PAIN. Patient not taking: Reported on 02/26/2020 03/16/13   Constant, Peggy, MD  levothyroxine (SYNTHROID, LEVOTHROID) 200 MCG tablet Take 200 mcg by mouth daily before breakfast. Patient not taking: Reported on 02/26/2020    [provider]  meclizine (ANTIVERT) 25 MG tablet Take 1 tablet (25 mg total) by mouth 3 (three) times daily as needed for dizziness. Take until 24 hours without dizziness Patient not taking: Reported on 02/26/2020 05/10/16   Tanna Furry, MD  montelukast (SINGULAIR) 10 MG tablet Take 10 mg by mouth at bedtime.    [provider]  naproxen (NAPROSYN) 500 MG tablet Take 500 mg by mouth 2 (two) times daily with a meal. Patient not taking: Reported on 02/26/2020    [provider]  nystatin-triamcinolone ointment (MYCOLOG) Apply 1 application topically 2 (two) times daily. Apply a small amount to the nodular skin of the areola of both breasts twice a day. Patient not taking: Reported on 02/26/2020 07/01/14   Robert Bellow, MD  Olmesartan-Amlodipine-HCTZ [facility]) 20-5-12.5 MG TABS Take 1 tablet by mouth daily. Patient not taking: Reported on 02/26/2020    [provider]  ondansetron (ZOFRAN ODT) 4  MG disintegrating tablet Take 1 tablet (4 mg total) by mouth every 8 (eight) hours as needed for nausea. Patient not taking: Reported on 02/26/2020 05/10/16   Tanna Furry, MD  potassium chloride (MICRO-K) 10 MEQ CR capsule Take 10 mEq by mouth daily.    [provider]  sertraline (ZOLOFT) 50 MG tablet Take 50 mg by mouth daily. Patient not taking: Reported on 02/26/2020    [provider]    Allergies as of 02/26/2020 - Review Complete 02/26/2020  Allergen Reaction Noted  . Cinnamon  05/10/2016  . Penicillins Hives and Rash 11/10/2010    Family History  Problem  Relation Age of Onset  . Arthritis Mother   . Asthma Mother   . Hypertension Mother   . Cancer Maternal Grandmother        THYRIOD  . Breast cancer Neg Hx     Social History   Socioeconomic History  . Marital status: Widowed    Spouse name: Not on file  . Number of children: Not on file  . Years of education: Not on file  . Highest education level: Not on file  Occupational History  . Not on file  Tobacco Use  . Smoking status: Former Smoker    Types: Cigarettes  . Smokeless tobacco: Never Used  . Tobacco comment: x 18yrs ago.  Vaping Use  . Vaping Use: Never used  Substance and Sexual Activity  . Alcohol use: Yes    Alcohol/week: 0.0 standard drinks    Comment: socially   . Drug use: No  . Sexual activity: Never  Other Topics Concern  . Not on file  Social History Narrative  . Not on file   Social Determinants of Health   Financial Resource Strain: Not on file  Food Insecurity: Not on file  Transportation Needs: Not on file  Physical Activity: Not on file  Stress: Not on file  Social Connections: Not on file  Intimate Partner Violence: Not on file    Review of Systems: See HPI, otherwise negative ROS  Physical Exam: BP 118/85   Pulse 76   Temp 98.3 F (36.8 C)   Resp 18   Ht 5\' 6"  (1.676 m)   Wt 116.1 kg   LMP 10/03/2015 (Approximate)   SpO2 100%   BMI 41.32 kg/m  General:   Alert,  pleasant and cooperative in NAD Head:  Normocephalic and atraumatic. Neck:  Supple; no masses or thyromegaly. Lungs:  Clear throughout to auscultation, normal respiratory effort.    Heart:  +S1, +S2, Regular rate and rhythm, No edema. Abdomen:  Soft, nontender and nondistended. Normal bowel sounds, without guarding, and without rebound.   Neurologic:  Alert and  oriented x4;  grossly normal neurologically.  Impression/Plan: Katherine Estes is here for an colonoscopy to be performed for Screening colonoscopy average risk   Risks, benefits, limitations, and  alternatives regarding  colonoscopy have been reviewed with the patient.  Questions have been answered.  All parties agreeable.   Jonathon Bellows, MD  04/11/2020, 9:37 AM

## 2020-04-11 NOTE — Anesthesia Postprocedure Evaluation (Signed)
Anesthesia Post Note  Patient: Katherine Estes  Procedure(s) Performed: COLONOSCOPY WITH PROPOFOL (N/A )  Patient location during evaluation: Endoscopy Anesthesia Type: General Level of consciousness: awake and alert Pain management: pain level controlled Vital Signs Assessment: post-procedure vital signs reviewed and stable Respiratory status: spontaneous breathing, nonlabored ventilation, respiratory function stable and patient connected to nasal cannula oxygen Cardiovascular status: blood pressure returned to baseline and stable Postop Assessment: no apparent nausea or vomiting Anesthetic complications: no   No complications documented.   Last Vitals:  Vitals:   04/11/20 1014 04/11/20 1024  BP: 120/75 117/89  Pulse: 74 77  Resp: 14 15  Temp:    SpO2: 100% 100%    Last Pain:  Vitals:   04/11/20 1024  TempSrc:   PainSc: 3                  Precious Haws Suzann Lazaro

## 2020-04-11 NOTE — Op Note (Signed)
[facility] Patient Name: Katherine Estes Procedure Date: 04/11/2020 9:38 AM MRN: [id number] Account #: [id number] Date of Birth: [birth year] Admit Type: Outpatient Age: 60 Room: [facility] ENDO ROOM 4 Gender: Female Note Status: Finalized Procedure:             Colonoscopy Indications:           Screening for colorectal malignant neoplasm Providers:             Jonathon Bellows MD, MD Referring MD:          Gennette Pac (Referring MD) Medicines:             Monitored Anesthesia Care Complications:         No immediate complications. Procedure:             Pre-Anesthesia Assessment:                        - Prior to the procedure, a History and Physical was                         performed, and patient medications, allergies and                         sensitivities were reviewed. The patient's tolerance                         of previous anesthesia was reviewed.                        - The risks and benefits of the procedure and the                         sedation options and risks were discussed with the                         patient. All questions were answered and informed                         consent was obtained.                        - ASA Grade Assessment: II - A patient with mild                         systemic disease.                        After obtaining informed consent, the colonoscope was                         passed under direct vision. Throughout the procedure,                         the patient's blood pressure, pulse, and oxygen                         saturations were monitored continuously. The                         Colonoscope was introduced through the anus and  advanced to the the cecum, identified by the                         appendiceal orifice. The colonoscopy was performed                         with ease. The patient tolerated the procedure well.                         The quality of the  bowel preparation was good. Findings:      The perianal and digital rectal examinations were normal.      The entire examined colon appeared normal on direct and retroflexion       views. Impression:            - The entire examined colon is normal on direct and                         retroflexion views.                        - No specimens collected. Recommendation:        - Discharge patient to home (with escort).                        - Resume previous diet.                        - Continue present medications.                        - Repeat colonoscopy in 10 years for screening                         purposes. Procedure Code(s):     --- Professional ---                        [phone], Colonoscopy, flexible; diagnostic, including                         collection of specimen(s) by brushing or washing, when                         performed (separate procedure) Diagnosis Code(s):     --- Professional ---                        Z12.11, Encounter for screening for malignant neoplasm                         of colon CPT copyright 2019 [employer]. All rights reserved. The codes documented in this report are preliminary and upon coder review may  be revised to meet current compliance requirements. Jonathon Bellows, MD Jonathon Bellows MD, MD 04/11/2020 10:05:05 AM This report has been signed electronically. Number of Addenda: 0 Note Initiated On: 04/11/2020 9:38 AM Scope Withdrawal Time: 0 hours 7 minutes 19 seconds  Total Procedure Duration: 0 hours 16 minutes 26 seconds  Estimated Blood Loss:  Estimated blood loss: none.      [facility]

## 2020-04-11 NOTE — Anesthesia Preprocedure Evaluation (Signed)
Anesthesia Evaluation  Patient identified by MRN, date of birth, ID band Patient awake    Reviewed: Allergy & Precautions, H&P , NPO status , Patient's Chart, lab work & pertinent test results  History of Anesthesia Complications (+) PONV and history of anesthetic complications  Airway Mallampati: III  TM Distance: >3 FB Neck ROM: full    Dental  (+) Chipped   Pulmonary neg shortness of breath, asthma , former smoker,    Pulmonary exam normal        Cardiovascular Exercise Tolerance: Good hypertension, (-) angina(-) Past MI Normal cardiovascular exam     Neuro/Psych  Headaches, negative psych ROS   GI/Hepatic negative GI ROS, Neg liver ROS,   Endo/Other  diabetes, Type 2Hypothyroidism   Renal/GU Renal disease  negative genitourinary   Musculoskeletal  (+) Arthritis ,   Abdominal   Peds  Hematology negative hematology ROS (+)   Anesthesia Other Findings Past Medical History: No date: Allergy No date: Anemia No date: Arthritis No date: Asthma No date: Diabetes mellitus without complication (HCC) No date: Hypertension No date: Hypothyroidism No date: Osteoporosis No date: Thyroid disease  Past Surgical History: No date: BLADDER SURGERY     Comment:  BLADDER LIFTED No date: BREAST BIOPSY; Left     Comment:  neg No date: BREAST SURGERY     Comment:  LUMP EXCISION LEFT BREAST. No date: CESAREAN SECTION No date: THYROIDECTOMY No date: TONSILLECTOMY No date: WISDOM TOOTH EXTRACTION  BMI    Body Mass Index: 41.32 kg/m      Reproductive/Obstetrics negative OB ROS                             Anesthesia Physical Anesthesia Plan  ASA: III  Anesthesia Plan: General   Post-op Pain Management:    Induction: Intravenous  PONV Risk Score and Plan: Propofol infusion and TIVA  Airway Management Planned: Natural Airway and Nasal Cannula  Additional Equipment:   Intra-op  Plan:   Post-operative Plan:   Informed Consent: I have reviewed the patients History and Physical, chart, labs and discussed the procedure including the risks, benefits and alternatives for the proposed anesthesia with the patient or authorized representative who has indicated his/her understanding and acceptance.     Dental Advisory Given  Plan Discussed with: Anesthesiologist, CRNA and Surgeon  Anesthesia Plan Comments: (Patient consented for risks of anesthesia including but not limited to:  - adverse reactions to medications - risk of airway placement if required - damage to eyes, teeth, lips or other oral mucosa - nerve damage due to positioning  - sore throat or hoarseness - Damage to heart, brain, nerves, lungs, other parts of body or loss of life  Patient voiced understanding.)        Anesthesia Quick Evaluation

## 2020-04-11 NOTE — Transfer of Care (Signed)
Immediate Anesthesia Transfer of Care Note  Patient: Katherine Estes  Procedure(s) Performed: COLONOSCOPY WITH PROPOFOL (N/A )  Patient Location: PACU  Anesthesia Type:General  Level of Consciousness: awake, alert  and oriented  Airway & Oxygen Therapy: Patient Spontanous Breathing and Patient connected to nasal cannula oxygen  Post-op Assessment: Report given to RN and Post -op Vital signs reviewed and stable  Post vital signs: Reviewed and stable  Last Vitals:  Vitals Value Taken Time  BP 96/54 04/11/20 1005  Temp    Pulse 81 04/11/20 1007  Resp 16 04/11/20 1008  SpO2 89 % 04/11/20 1007  Vitals shown include unvalidated device data.  Last Pain:  Vitals:   04/11/20 1004  TempSrc: Tympanic  PainSc: 0-No pain         Complications: No complications documented.

## 2020-04-14 ENCOUNTER — Encounter: Payer: Self-pay | Admitting: Gastroenterology

## 2020-05-26 ENCOUNTER — Other Ambulatory Visit: Payer: Self-pay

## 2020-05-26 ENCOUNTER — Encounter: Payer: Medicaid Other | Attending: Internal Medicine

## 2020-05-26 DIAGNOSIS — R0600 Dyspnea, unspecified: Secondary | ICD-10-CM

## 2020-05-26 DIAGNOSIS — R0609 Other forms of dyspnea: Secondary | ICD-10-CM | POA: Insufficient documentation

## 2020-05-26 NOTE — Progress Notes (Signed)
Virtual Visit completed. Patient informed on EP and RD appointment and 6 Minute walk test. Patient also informed of patient health questionnaires on My Chart. Patient Verbalizes understanding. Visit diagnosis can be found in [facility] 05/14/2020.

## 2020-06-02 ENCOUNTER — Other Ambulatory Visit: Payer: Self-pay

## 2020-06-02 ENCOUNTER — Encounter: Payer: Medicaid Other | Admitting: *Deleted

## 2020-06-02 VITALS — Ht 66.1 in | Wt 258.1 lb

## 2020-06-02 DIAGNOSIS — R0609 Other forms of dyspnea: Secondary | ICD-10-CM | POA: Diagnosis present

## 2020-06-02 DIAGNOSIS — R0600 Dyspnea, unspecified: Secondary | ICD-10-CM

## 2020-06-02 NOTE — Progress Notes (Signed)
Pulmonary Individual Treatment Plan  Patient Details  Name: Katherine Estes MRN: [id number] Date of Birth: 07-03-1960 Referring Provider:   Flowsheet Row Pulmonary Rehab from 06/02/2020 in [facility] Cardiac and Pulmonary Rehab  Referring Provider Hulan Fess MD      Initial Encounter Date:  Flowsheet Row Pulmonary Rehab from 06/02/2020 in [facility] Cardiac and Pulmonary Rehab  Date 06/02/20      Visit Diagnosis: DOE (dyspnea on exertion)  Patient's Home Medications on Admission:  Current Outpatient Medications:  .  albuterol (PROVENTIL) (2.5 MG/3ML) 0.083% nebulizer solution, albuterol sulfate 2.5 mg/3 mL (0.083 %) solution for nebulization, Disp: , Rfl:  .  Albuterol Sulfate (PROAIR HFA IN), Inhale into the lungs as needed., Disp: , Rfl:  .  amLODipine (NORVASC) 5 MG tablet, Take 5 mg by mouth daily., Disp: , Rfl:  .  cholecalciferol (VITAMIN D) 1000 units tablet, Take 1,000 Units by mouth daily., Disp: , Rfl:  .  Fluticasone-Salmeterol (ADVAIR) 500-50 MCG/DOSE AEPB, Inhale 1 puff into the lungs every 12 (twelve) hours.   (Patient not taking: Reported on 05/26/2020), Disp: , Rfl:  .  Fluticasone-Salmeterol (ADVAIR) 500-50 MCG/DOSE AEPB, Advair Diskus 500 mcg-50 mcg/dose powder for inhalation, Disp: , Rfl:  .  hydrochlorothiazide (HYDRODIURIL) 25 MG tablet, Take 25 mg by mouth daily., Disp: , Rfl:  .  HYDROcodone-acetaminophen (VICODIN) 5-325 mg TABS tablet, Take 6 tablets by mouth 4 (four) times daily as needed (Pain). (Patient not taking: No sig reported), Disp: 20 tablet, Rfl: 0 .  ibuprofen (ADVIL,MOTRIN) 800 MG tablet, TAKE 1 TABLET (800 MG TOTAL) BY MOUTH EVERY 8 (EIGHT) HOURS AS NEEDED FOR PAIN., Disp: 30 tablet, Rfl: 1 .  levothyroxine (SYNTHROID) 150 MCG tablet, levothyroxine 150 mcg tablet (Patient not taking: Reported on 05/26/2020), Disp: , Rfl:  .  levothyroxine (SYNTHROID, LEVOTHROID) 175 MCG tablet, Take 175 mcg by mouth daily., Disp: , Rfl:  .  levothyroxine (SYNTHROID,  LEVOTHROID) 200 MCG tablet, Take 200 mcg by mouth daily before breakfast. (Patient not taking: No sig reported), Disp: , Rfl:  .  losartan (COZAAR) 50 MG tablet, Take 50 mg by mouth daily., Disp: , Rfl:  .  meclizine (ANTIVERT) 25 MG tablet, Take 1 tablet (25 mg total) by mouth 3 (three) times daily as needed for dizziness. Take until 24 hours without dizziness (Patient not taking: No sig reported), Disp: 30 tablet, Rfl: 0 .  meloxicam (MOBIC) 7.5 MG tablet, meloxicam 7.5 mg tablet  TAKE 1 TABLET BY MOUTH EVERY DAY FOR 30 DAYS, Disp: , Rfl:  .  metFORMIN (GLUCOPHAGE) 500 MG tablet, Take 500 mg by mouth 2 (two) times daily with a meal., Disp: , Rfl:  .  montelukast (SINGULAIR) 10 MG tablet, Take 10 mg by mouth at bedtime., Disp: , Rfl:  .  naproxen (NAPROSYN) 500 MG tablet, Take 500 mg by mouth 2 (two) times daily with a meal. (Patient not taking: No sig reported), Disp: , Rfl:  .  nystatin-triamcinolone ointment (MYCOLOG), Apply 1 application topically 2 (two) times daily. Apply a small amount to the nodular skin of the areola of both breasts twice a day. (Patient not taking: No sig reported), Disp: 30 g, Rfl: 0 .  Olmesartan-amLODIPine-HCTZ 20-5-12.5 MG TABS, Take 1 tablet by mouth daily. (Patient not taking: No sig reported), Disp: , Rfl:  .  ondansetron (ZOFRAN ODT) 4 MG disintegrating tablet, Take 1 tablet (4 mg total) by mouth every 8 (eight) hours as needed for nausea. (Patient not taking: No sig reported), Disp: 6  tablet, Rfl: 0 .  potassium chloride (KLOR-CON) 10 MEQ tablet, Take 10 mEq by mouth daily., Disp: , Rfl:  .  potassium chloride (MICRO-K) 10 MEQ CR capsule, Take 10 mEq by mouth daily. (Patient not taking: Reported on 05/26/2020), Disp: , Rfl:  .  PROVENTIL HFA 108 (90 Base) MCG/ACT inhaler, Inhale 2 puff as directed every four hours as needed, Disp: , Rfl:  .  sertraline (ZOLOFT) 50 MG tablet, Take 50 mg by mouth daily. (Patient not taking: No sig reported), Disp: , Rfl:   Past Medical  History: Past Medical History:  Diagnosis Date  . Allergy   . Anemia   . Arthritis   . Asthma   . Diabetes mellitus without complication ([place])   . Hypertension   . Hypothyroidism   . Osteoporosis   . Thyroid disease     Tobacco Use: Social History   Tobacco Use  Smoking Status Former Smoker  . Packs/day: 0.25  . Years: 3.00  . Pack years: 0.75  . Types: Cigarettes  . Quit date: 05/21/1991  . Years since quitting: 29.0  Smokeless Tobacco Never Used  Tobacco Comment   she only smoked when she drank    Labs: Recent Review Flowsheet Data   There is no flowsheet data to display.      Pulmonary Assessment Scores:  Pulmonary Assessment Scores    Row Name 06/02/20 1351         ADL UCSD   ADL Phase Entry     SOB Score total 79     Rest 0     Walk 3     Stairs 5     Bath 3     Dress 2     Shop 5           CAT Score   CAT Score 26           mMRC Score   mMRC Score 4            UCSD: Self-administered rating of dyspnea associated with activities of daily living (ADLs) 6-point scale (0 = "not at all" to 5 = "maximal or unable to do because of breathlessness")  Scoring Scores range from 0 to 120.  Minimally important difference is 5 units  CAT: CAT can identify the health impairment of COPD patients and is better correlated with disease progression.  CAT has a scoring range of zero to 40. The CAT score is classified into four groups of low (less than 10), medium (10 - 20), high (21-30) and very high (31-40) based on the impact level of disease on health status. A CAT score over 10 suggests significant symptoms.  A worsening CAT score could be explained by an exacerbation, poor medication adherence, poor inhaler technique, or progression of COPD or comorbid conditions.  CAT MCID is 2 points  mMRC: mMRC (Modified Medical Research Council) Dyspnea Scale is used to assess the degree of baseline functional disability in patients of respiratory disease due to  dyspnea. No minimal important difference is established. A decrease in score of 1 point or greater is considered a positive change.   Pulmonary Function Assessment:  Pulmonary Function Assessment - 05/26/20 1409      Breath   Shortness of Breath Limiting activity;Yes           Exercise Target Goals: Exercise Program Goal: Individual exercise prescription set using results from initial 6 min walk test and THRR while considering  patient's activity barriers and safety.  Exercise Prescription Goal: Initial exercise prescription builds to 30-45 minutes a day of aerobic activity, 2-3 days per week.  Home exercise guidelines will be given to patient during program as part of exercise prescription that the participant will acknowledge.  Education: Aerobic Exercise: - Group verbal and visual presentation on the components of exercise prescription. Introduces F.I.T.T principle from ACSM for exercise prescriptions.  Reviews F.I.T.T. principles of aerobic exercise including progression. Written material given at graduation.   Education: Resistance Exercise: - Group verbal and visual presentation on the components of exercise prescription. Introduces F.I.T.T principle from ACSM for exercise prescriptions  Reviews F.I.T.T. principles of resistance exercise including progression. Written material given at graduation.    Education: Exercise & Equipment Safety: - Individual verbal instruction and demonstration of equipment use and safety with use of the equipment. Flowsheet Row Pulmonary Rehab from 06/02/2020 in [facility] Cardiac and Pulmonary Rehab  Date 05/26/20  Educator [facility]  Instruction Review Code 1- Verbalizes Understanding      Education: Exercise Physiology & General Exercise Guidelines: - Group verbal and written instruction with models to review the exercise physiology of the cardiovascular system and associated critical values. Provides general exercise guidelines with specific guidelines  to those with heart or lung disease.    Education: Flexibility, Balance, Mind/Body Relaxation: - Group verbal and visual presentation with interactive activity on the components of exercise prescription. Introduces F.I.T.T principle from ACSM for exercise prescriptions. Reviews F.I.T.T. principles of flexibility and balance exercise training including progression. Also discusses the mind body connection.  Reviews various relaxation techniques to help reduce and manage stress (i.e. Deep breathing, progressive muscle relaxation, and visualization). Balance handout provided to take home. Written material given at graduation.   Activity Barriers & Risk Stratification:  Activity Barriers & Cardiac Risk Stratification - 06/02/20 1342      Activity Barriers & Cardiac Risk Stratification   Activity Barriers Arthritis;Deconditioning;Muscular Weakness;Shortness of Breath;Balance Concerns;Joint Problems;Back Problems   chronic knee pain (hurts all the time), spinal deterioration          6 Minute Walk:  6 Minute Walk    Row Name 06/02/20 1340         6 Minute Walk   Phase Initial     Distance 700 feet     Walk Time 4.96 minutes     # of Rest Breaks 4  10 sec, 22 sec, 15 sec, 15 sec     MPH 1.6     METS 1.76     RPE 13     Perceived Dyspnea  1     VO2 Peak 6.17     Symptoms Yes (comment)     Comments SOB, chronic knee pain     Resting HR 76 bpm     Resting BP 126/64     Resting Oxygen Saturation  996 %     Exercise Oxygen Saturation  during 6 min walk 94 %     Max Ex. HR 98 bpm     Max Ex. BP 150/64     2 Minute Post BP 144/66           Interval HR   1 Minute HR 86     2 Minute HR 91     3 Minute HR 89     4 Minute HR 94     5 Minute HR 85     6 Minute HR 98     2 Minute Post HR 75     Interval Heart  Rate? Yes           Interval Oxygen   Interval Oxygen? Yes     Baseline Oxygen Saturation % 96 %     1 Minute Oxygen Saturation % 95 %     1 Minute Liters of Oxygen 0 L   Room Air     2 Minute Oxygen Saturation % 94 %     2 Minute Liters of Oxygen 0 L     3 Minute Oxygen Saturation % 94 %     3 Minute Liters of Oxygen 0 L     4 Minute Oxygen Saturation % 95 %     4 Minute Liters of Oxygen 0 L     5 Minute Oxygen Saturation % 94 %     5 Minute Liters of Oxygen 0 L     6 Minute Oxygen Saturation % 94 %     6 Minute Liters of Oxygen 0 L     2 Minute Post Oxygen Saturation % 95 %     2 Minute Post Liters of Oxygen 0 L           Oxygen Initial Assessment:  Oxygen Initial Assessment - 05/26/20 1405      Home Oxygen   Home Oxygen Device None    Sleep Oxygen Prescription None    Home Exercise Oxygen Prescription None    Home Resting Oxygen Prescription None      Initial 6 min Walk   Oxygen Used None      Program Oxygen Prescription   Program Oxygen Prescription None      Intervention   Short Term Goals To learn and demonstrate proper use of respiratory medications;To learn and demonstrate proper pursed lip breathing techniques or other breathing techniques.;To learn and understand importance of maintaining oxygen saturations>88%;To learn and understand importance of monitoring SPO2 with pulse oximeter and demonstrate accurate use of the pulse oximeter.    Long  Term Goals Maintenance of O2 saturations>88%;Verbalizes importance of monitoring SPO2 with pulse oximeter and return demonstration;Exhibits proper breathing techniques, such as pursed lip breathing or other method taught during program session;Compliance with respiratory medication;Demonstrates proper use of MDI's           Oxygen Re-Evaluation:   Oxygen Discharge (Final Oxygen Re-Evaluation):   Initial Exercise Prescription:  Initial Exercise Prescription - 06/02/20 1300      Date of Initial Exercise RX and Referring Provider   Date 06/02/20    Referring Provider Hulan Fess MD      Treadmill   MPH 1.4    Grade 0.5    Minutes 15    METs 2.17      Recumbant Bike    Level 1    RPM 50    Watts 8    Minutes 15    METs 1.7      NuStep   Level 1    SPM 80    Minutes 15    METs 1.7      REL-XR   Level 1    Speed 50    Minutes 15    METs 1.7      Prescription Details   Frequency (times per week) 3    Duration Progress to 30 minutes of continuous aerobic without signs/symptoms of physical distress      Intensity   THRR 40-80% of Max Heartrate 107-143    Ratings of Perceived Exertion 11-13    Perceived Dyspnea 0-4  Progression   Progression Continue to progress workloads to maintain intensity without signs/symptoms of physical distress.      Resistance Training   Training Prescription Yes    Weight 3 lb    Reps 10-15           Perform Capillary Blood Glucose checks as needed.  Exercise Prescription Changes:  Exercise Prescription Changes    Row Name 06/02/20 1300             Response to Exercise   Blood Pressure (Admit) 126/64       Blood Pressure (Exercise) 150/64       Blood Pressure (Exit) 126/66       Heart Rate (Admit) 76 bpm       Heart Rate (Exercise) 98 bpm       Heart Rate (Exit) 71 bpm       Oxygen Saturation (Admit) 96 %       Oxygen Saturation (Exercise) 94 %       Oxygen Saturation (Exit) 96 %       Rating of Perceived Exertion (Exercise) 13       Perceived Dyspnea (Exercise) 1       Symptoms SOB, chronic knee pain       Comments walk test results              Exercise Comments:   Exercise Goals and Review:  Exercise Goals    Row Name 06/02/20 1348             Exercise Goals   Increase Physical Activity Yes       Intervention Provide advice, education, support and counseling about physical activity/exercise needs.;Develop an individualized exercise prescription for aerobic and resistive training based on initial evaluation findings, risk stratification, comorbidities and participant's personal goals.       Expected Outcomes Long Term: Add in home exercise to make exercise part of routine  and to increase amount of physical activity.;Short Term: Attend rehab on a regular basis to increase amount of physical activity.;Long Term: Exercising regularly at least 3-5 days a week.       Increase Strength and Stamina Yes       Intervention Provide advice, education, support and counseling about physical activity/exercise needs.;Develop an individualized exercise prescription for aerobic and resistive training based on initial evaluation findings, risk stratification, comorbidities and participant's personal goals.       Expected Outcomes Short Term: Increase workloads from initial exercise prescription for resistance, speed, and METs.;Short Term: Perform resistance training exercises routinely during rehab and add in resistance training at home;Long Term: Improve cardiorespiratory fitness, muscular endurance and strength as measured by increased METs and functional capacity (6MWT)       Able to understand and use rate of perceived exertion (RPE) scale Yes       Intervention Provide education and explanation on how to use RPE scale       Expected Outcomes Short Term: Able to use RPE daily in rehab to express subjective intensity level;Long Term:  Able to use RPE to guide intensity level when exercising independently       Able to understand and use Dyspnea scale Yes       Intervention Provide education and explanation on how to use Dyspnea scale       Expected Outcomes Short Term: Able to use Dyspnea scale daily in rehab to express subjective sense of shortness of breath during exertion;Long Term: Able to use Dyspnea scale to guide intensity  level when exercising independently       Knowledge and understanding of Target Heart Rate Range (THRR) Yes       Intervention Provide education and explanation of THRR including how the numbers were predicted and where they are located for reference       Expected Outcomes Short Term: Able to state/look up THRR;Short Term: Able to use daily as guideline for  intensity in rehab;Long Term: Able to use THRR to govern intensity when exercising independently       Able to check pulse independently Yes       Intervention Provide education and demonstration on how to check pulse in carotid and radial arteries.;Review the importance of being able to check your own pulse for safety during independent exercise       Expected Outcomes Short Term: Able to explain why pulse checking is important during independent exercise;Long Term: Able to check pulse independently and accurately       Understanding of Exercise Prescription Yes       Intervention Provide education, explanation, and written materials on patient's individual exercise prescription       Expected Outcomes Short Term: Able to explain program exercise prescription;Long Term: Able to explain home exercise prescription to exercise independently              Exercise Goals Re-Evaluation :   Discharge Exercise Prescription (Final Exercise Prescription Changes):  Exercise Prescription Changes - 06/02/20 1300      Response to Exercise   Blood Pressure (Admit) 126/64    Blood Pressure (Exercise) 150/64    Blood Pressure (Exit) 126/66    Heart Rate (Admit) 76 bpm    Heart Rate (Exercise) 98 bpm    Heart Rate (Exit) 71 bpm    Oxygen Saturation (Admit) 96 %    Oxygen Saturation (Exercise) 94 %    Oxygen Saturation (Exit) 96 %    Rating of Perceived Exertion (Exercise) 13    Perceived Dyspnea (Exercise) 1    Symptoms SOB, chronic knee pain    Comments walk test results           Nutrition:  Target Goals: Understanding of nutrition guidelines, daily intake of sodium 1500mg , cholesterol 200mg , calories 30% from fat and 7% or less from saturated fats, daily to have 5 or more servings of fruits and vegetables.  Education: All About Nutrition: -Group instruction provided by verbal, written material, interactive activities, discussions, models, and posters to present general guidelines for  heart healthy nutrition including fat, fiber, MyPlate, the role of sodium in heart healthy nutrition, utilization of the nutrition label, and utilization of this knowledge for meal planning. Follow up email sent as well. Written material given at graduation.   Biometrics:  Pre Biometrics - 06/02/20 1348      Pre Biometrics   Height 5' 6.1" (1.679 m)    Weight 258 lb 1.6 oz (117.1 kg)    BMI (Calculated) 41.53    Single Leg Stand 1.2 seconds            Nutrition Therapy Plan and Nutrition Goals:   Nutrition Assessments:  MEDIFICTS Score Key:  ?70 Need to make dietary changes   40-70 Heart Healthy Diet  ? 40 Therapeutic Level Cholesterol Diet  Flowsheet Row Pulmonary Rehab from 06/02/2020 in [facility] Cardiac and Pulmonary Rehab  Picture Your Plate Total Score on Admission 76     Picture Your Plate Scores:  <12 Unhealthy dietary pattern with much room for improvement.  41-50  Dietary pattern unlikely to meet recommendations for good health and room for improvement.  51-60 More healthful dietary pattern, with some room for improvement.   >60 Healthy dietary pattern, although there may be some specific behaviors that could be improved.   Nutrition Goals Re-Evaluation:   Nutrition Goals Discharge (Final Nutrition Goals Re-Evaluation):   Psychosocial: Target Goals: Acknowledge presence or absence of significant depression and/or stress, maximize coping skills, provide positive support system. Participant is able to verbalize types and ability to use techniques and skills needed for reducing stress and depression.   Education: Stress, Anxiety, and Depression - Group verbal and visual presentation to define topics covered.  Reviews how body is impacted by stress, anxiety, and depression.  Also discusses healthy ways to reduce stress and to treat/manage anxiety and depression.  Written material given at graduation.   Education: Sleep Hygiene -Provides group verbal and  written instruction about how sleep can affect your health.  Define sleep hygiene, discuss sleep cycles and impact of sleep habits. Review good sleep hygiene tips.    Initial Review & Psychosocial Screening:  Initial Psych Review & Screening - 05/26/20 1413      Initial Review   Current issues with None Identified;Current Anxiety/Panic;History of Depression      Family Dynamics   Good Support System? Yes    Comments She has a son, aunt and lives with her 31 year old mom. Her two sisters also live in town. She has a good outlook on her health.      Barriers   Psychosocial barriers to participate in program The patient should benefit from training in stress management and relaxation.      Screening Interventions   Interventions Encouraged to exercise;Program counselor consult;To provide support and resources with identified psychosocial needs;Provide feedback about the scores to participant    Expected Outcomes Short Term goal: Utilizing psychosocial counselor, staff and physician to assist with identification of specific Stressors or current issues interfering with healing process. Setting desired goal for each stressor or current issue identified.;Short Term goal: Identification and review with participant of any Quality of Life or Depression concerns found by scoring the questionnaire.;Long Term Goal: Stressors or current issues are controlled or eliminated.;Long Term goal: The participant improves quality of Life and PHQ9 Scores as seen by post scores and/or verbalization of changes           Quality of Life Scores:  Scores of 19 and below usually indicate a poorer quality of life in these areas.  A difference of  2-3 points is a clinically meaningful difference.  A difference of 2-3 points in the total score of the Quality of Life Index has been associated with significant improvement in overall quality of life, self-image, physical symptoms, and general health in studies assessing  change in quality of life.  PHQ-9: Recent Review Flowsheet Data    Depression screen [facility] 2/9 06/02/2020   Decreased Interest 3   Down, Depressed, Hopeless 0   PHQ - 2 Score 3   Altered sleeping 3   Tired, decreased energy 3   Change in appetite 3   Feeling bad or failure about yourself  0   Trouble concentrating 3   Moving slowly or fidgety/restless 0   Suicidal thoughts 0   PHQ-9 Score 15   Difficult doing work/chores Somewhat difficult     Interpretation of Total Score  Total Score Depression Severity:  1-4 = Minimal depression, 5-9 = Mild depression, 10-14 = Moderate depression, 15-19 =  Moderately severe depression, 20-27 = Severe depression   Psychosocial Evaluation and Intervention:  Psychosocial Evaluation - 05/26/20 1419      Psychosocial Evaluation & Interventions   Interventions Encouraged to exercise with the program and follow exercise prescription;Relaxation education;Stress management education    Comments She has a son, aunt and lives with her 106 year old mom. Her two sisters also live in town. She has a good outlook on her health.    Expected Outcomes Short: Exercise regularly to support mental health and notify staff of any changes. Long: maintain mental health and well being through teaching of rehab or prescribed medications independently.    Continue Psychosocial Services  Follow up required by staff           Psychosocial Re-Evaluation:   Psychosocial Discharge (Final Psychosocial Re-Evaluation):   Education: Education Goals: Education classes will be provided on a weekly basis, covering required topics. Participant will state understanding/return demonstration of topics presented.  Learning Barriers/Preferences:  Learning Barriers/Preferences - 05/26/20 1409      Learning Barriers/Preferences   Learning Barriers None    Learning Preferences None           General Pulmonary Education Topics:  Infection Prevention: - Provides verbal and  written material to individual with discussion of infection control including proper hand washing and proper equipment cleaning during exercise session. Flowsheet Row Pulmonary Rehab from 06/02/2020 in [facility] Cardiac and Pulmonary Rehab  Date 05/26/20  Educator [facility]  Instruction Review Code 1- Verbalizes Understanding      Falls Prevention: - Provides verbal and written material to individual with discussion of falls prevention and safety. Flowsheet Row Pulmonary Rehab from 06/02/2020 in [facility] Cardiac and Pulmonary Rehab  Date 05/26/20  Educator [facility]  Instruction Review Code 1- Verbalizes Understanding      Chronic Lung Disease Review: - Group verbal instruction with posters, models, PowerPoint presentations and videos,  to review new updates, new respiratory medications, new advancements in procedures and treatments. Providing information on websites and "800" numbers for continued self-education. Includes information about supplement oxygen, available portable oxygen systems, continuous and intermittent flow rates, oxygen safety, concentrators, and Medicare reimbursement for oxygen. Explanation of Pulmonary Drugs, including class, frequency, complications, importance of spacers, rinsing mouth after steroid MDI's, and proper cleaning methods for nebulizers. Review of basic lung anatomy and physiology related to function, structure, and complications of lung disease. Review of risk factors. Discussion about methods for diagnosing sleep apnea and types of masks and machines for OSA. Includes a review of the use of types of environmental controls: home humidity, furnaces, filters, dust mite/pet prevention, HEPA vacuums. Discussion about weather changes, air quality and the benefits of nasal washing. Instruction on Warning signs, infection symptoms, calling MD promptly, preventive modes, and value of vaccinations. Review of effective airway clearance, coughing and/or vibration techniques. Emphasizing that all  should Create an Action Plan. Written material given at graduation. Flowsheet Row Pulmonary Rehab from 06/02/2020 in [facility] Cardiac and Pulmonary Rehab  Education need identified 06/02/20      AED/CPR: - Group verbal and written instruction with the use of models to demonstrate the basic use of the AED with the basic ABC's of resuscitation.    Anatomy and Cardiac Procedures: - Group verbal and visual presentation and models provide information about basic cardiac anatomy and function. Reviews the testing methods done to diagnose heart disease and the outcomes of the test results. Describes the treatment choices: Medical Management, Angioplasty, or Coronary Bypass Surgery for treating various  heart conditions including Myocardial Infarction, Angina, Valve Disease, and Cardiac Arrhythmias.  Written material given at graduation.   Medication Safety: - Group verbal and visual instruction to review commonly prescribed medications for heart and lung disease. Reviews the medication, class of the drug, and side effects. Includes the steps to properly store meds and maintain the prescription regimen.  Written material given at graduation.   Other: -Provides group and verbal instruction on various topics (see comments)   Knowledge Questionnaire Score:  Knowledge Questionnaire Score - 06/02/20 1349      Knowledge Questionnaire Score   Pre Score 16/18 Education Focus: O2 safety            Core Components/Risk Factors/Patient Goals at Admission:  Personal Goals and Risk Factors at Admission - 06/02/20 1350      Core Components/Risk Factors/Patient Goals on Admission    Weight Management Yes;Weight Loss;Obesity    Intervention Weight Management: Develop a combined nutrition and exercise program designed to reach desired caloric intake, while maintaining appropriate intake of nutrient and fiber, sodium and fats, and appropriate energy expenditure required for the weight goal.;Weight Management:  Provide education and appropriate resources to help participant work on and attain dietary goals.;Weight Management/Obesity: Establish reasonable short term and long term weight goals.;Obesity: Provide education and appropriate resources to help participant work on and attain dietary goals.    Admit Weight 258 lb 1.6 oz (117.1 kg)    Goal Weight: Short Term 250 lb (113.4 kg)    Goal Weight: Long Term 240 lb (108.9 kg)    Expected Outcomes Short Term: Continue to assess and modify interventions until short term weight is achieved;Long Term: Adherence to nutrition and physical activity/exercise program aimed toward attainment of established weight goal;Weight Loss: Understanding of general recommendations for a balanced deficit meal plan, which promotes 1-2 lb weight loss per week and includes a negative energy balance of [phone] kcal/d;Understanding recommendations for meals to include 15-35% energy as protein, 25-35% energy from fat, 35-60% energy from carbohydrates, less than 200mg  of dietary cholesterol, 20-35 gm of total fiber daily;Understanding of distribution of calorie intake throughout the day with the consumption of 4-5 meals/snacks    Improve shortness of breath with ADL's Yes    Intervention Provide education, individualized exercise plan and daily activity instruction to help decrease symptoms of SOB with activities of daily living.    Expected Outcomes Short Term: Improve cardiorespiratory fitness to achieve a reduction of symptoms when performing ADLs;Long Term: Be able to perform more ADLs without symptoms or delay the onset of symptoms    Diabetes Yes    Intervention Provide education about signs/symptoms and action to take for hypo/hyperglycemia.;Provide education about proper nutrition, including hydration, and aerobic/resistive exercise prescription along with prescribed medications to achieve blood glucose in normal ranges: Fasting glucose 65-99 mg/dL    Expected Outcomes Short Term:  Participant verbalizes understanding of the signs/symptoms and immediate care of hyper/hypoglycemia, proper foot care and importance of medication, aerobic/resistive exercise and nutrition plan for blood glucose control.;Long Term: Attainment of HbA1C < 7%.    Hypertension Yes    Intervention Provide education on lifestyle modifcations including regular physical activity/exercise, weight management, moderate sodium restriction and increased consumption of fresh fruit, vegetables, and low fat dairy, alcohol moderation, and smoking cessation.;Monitor prescription use compliance.    Expected Outcomes Short Term: Continued assessment and intervention until BP is < 140/32mm HG in hypertensive participants. < 130/77mm HG in hypertensive participants with diabetes, heart failure or chronic kidney disease.;Long Term:  Maintenance of blood pressure at goal levels.           Education:Diabetes - Individual verbal and written instruction to review signs/symptoms of diabetes, desired ranges of glucose level fasting, after meals and with exercise. Acknowledge that pre and post exercise glucose checks will be done for 3 sessions at entry of program. Flowsheet Row Pulmonary Rehab from 06/02/2020 in [facility] Cardiac and Pulmonary Rehab  Date 05/26/20  Educator [facility]  Instruction Review Code 1- Verbalizes Understanding      Know Your Numbers and Heart Failure: - Group verbal and visual instruction to discuss disease risk factors for cardiac and pulmonary disease and treatment options.  Reviews associated critical values for Overweight/Obesity, Hypertension, Cholesterol, and Diabetes.  Discusses basics of heart failure: signs/symptoms and treatments.  Introduces Heart Failure Zone chart for action plan for heart failure.  Written material given at graduation.   Core Components/Risk Factors/Patient Goals Review:    Core Components/Risk Factors/Patient Goals at Discharge (Final Review):    ITP Comments:  ITP  Comments    Row Name 05/26/20 1405 06/02/20 1340         ITP Comments Virtual Visit completed. Patient informed on EP and RD appointment and 6 Minute walk test. Patient also informed of patient health questionnaires on My Chart. Patient Verbalizes understanding. Visit diagnosis can be found in [facility] 05/14/2020. Completed 6MWT and gym orientation. Initial ITP created and sent for review to Dr. Emily Filbert, Medical Director.             Comments: Initial ITP

## 2020-06-02 NOTE — Patient Instructions (Signed)
Patient Instructions  Patient Details  Name: Katherine Estes MRN: [id number] Date of Birth: 1960/04/07 Referring Provider:  Hoyle Barr*  Below are your personal goals for exercise, nutrition, and risk factors. Our goal is to help you stay on track towards obtaining and maintaining these goals. We will be discussing your progress on these goals with you throughout the program.  Initial Exercise Prescription:  Initial Exercise Prescription - 06/02/20 1300      Date of Initial Exercise RX and Referring Provider   Date 06/02/20    Referring Provider Hulan Fess MD      Treadmill   MPH 1.4    Grade 0.5    Minutes 15    METs 2.17      Recumbant Bike   Level 1    RPM 50    Watts 8    Minutes 15    METs 1.7      NuStep   Level 1    SPM 80    Minutes 15    METs 1.7      REL-XR   Level 1    Speed 50    Minutes 15    METs 1.7      Prescription Details   Frequency (times per week) 3    Duration Progress to 30 minutes of continuous aerobic without signs/symptoms of physical distress      Intensity   THRR 40-80% of Max Heartrate 107-143    Ratings of Perceived Exertion 11-13    Perceived Dyspnea 0-4      Progression   Progression Continue to progress workloads to maintain intensity without signs/symptoms of physical distress.      Resistance Training   Training Prescription Yes    Weight 3 lb    Reps 10-15           Exercise Goals: Frequency: Be able to perform aerobic exercise two to three times per week in program working toward 2-5 days per week of home exercise.  Intensity: Work with a perceived exertion of 11 (fairly light) - 15 (hard) while following your exercise prescription.  We will make changes to your prescription with you as you progress through the program.   Duration: Be able to do 30 to 45 minutes of continuous aerobic exercise in addition to a 5 minute warm-up and a 5 minute cool-down routine.   Nutrition Goals: Your  personal nutrition goals will be established when you do your nutrition analysis with the dietician.  The following are general nutrition guidelines to follow: Cholesterol < 200mg /day Sodium < 1500mg /day Fiber: Women over 50 yrs - 21 grams per day  Personal Goals:  Personal Goals and Risk Factors at Admission - 06/02/20 1350      Core Components/Risk Factors/Patient Goals on Admission    Weight Management Yes;Weight Loss;Obesity    Intervention Weight Management: Develop a combined nutrition and exercise program designed to reach desired caloric intake, while maintaining appropriate intake of nutrient and fiber, sodium and fats, and appropriate energy expenditure required for the weight goal.;Weight Management: Provide education and appropriate resources to help participant work on and attain dietary goals.;Weight Management/Obesity: Establish reasonable short term and long term weight goals.;Obesity: Provide education and appropriate resources to help participant work on and attain dietary goals.    Admit Weight 258 lb 1.6 oz (117.1 kg)    Goal Weight: Short Term 250 lb (113.4 kg)    Goal Weight: Long Term 240 lb (108.9 kg)    Expected Outcomes  Short Term: Continue to assess and modify interventions until short term weight is achieved;Long Term: Adherence to nutrition and physical activity/exercise program aimed toward attainment of established weight goal;Weight Loss: Understanding of general recommendations for a balanced deficit meal plan, which promotes 1-2 lb weight loss per week and includes a negative energy balance of [phone] kcal/d;Understanding recommendations for meals to include 15-35% energy as protein, 25-35% energy from fat, 35-60% energy from carbohydrates, less than 200mg  of dietary cholesterol, 20-35 gm of total fiber daily;Understanding of distribution of calorie intake throughout the day with the consumption of 4-5 meals/snacks    Improve shortness of breath with ADL's Yes     Intervention Provide education, individualized exercise plan and daily activity instruction to help decrease symptoms of SOB with activities of daily living.    Expected Outcomes Short Term: Improve cardiorespiratory fitness to achieve a reduction of symptoms when performing ADLs;Long Term: Be able to perform more ADLs without symptoms or delay the onset of symptoms    Diabetes Yes    Intervention Provide education about signs/symptoms and action to take for hypo/hyperglycemia.;Provide education about proper nutrition, including hydration, and aerobic/resistive exercise prescription along with prescribed medications to achieve blood glucose in normal ranges: Fasting glucose 65-99 mg/dL    Expected Outcomes Short Term: Participant verbalizes understanding of the signs/symptoms and immediate care of hyper/hypoglycemia, proper foot care and importance of medication, aerobic/resistive exercise and nutrition plan for blood glucose control.;Long Term: Attainment of HbA1C < 7%.    Hypertension Yes    Intervention Provide education on lifestyle modifcations including regular physical activity/exercise, weight management, moderate sodium restriction and increased consumption of fresh fruit, vegetables, and low fat dairy, alcohol moderation, and smoking cessation.;Monitor prescription use compliance.    Expected Outcomes Short Term: Continued assessment and intervention until BP is < 140/47mm HG in hypertensive participants. < 130/71mm HG in hypertensive participants with diabetes, heart failure or chronic kidney disease.;Long Term: Maintenance of blood pressure at goal levels.           Tobacco Use Initial Evaluation: Social History   Tobacco Use  Smoking Status Former Smoker  . Packs/day: 0.25  . Years: 3.00  . Pack years: 0.75  . Types: Cigarettes  . Quit date: 05/21/1991  . Years since quitting: 29.0  Smokeless Tobacco Never Used  Tobacco Comment   she only smoked when she drank    Exercise  Goals and Review:  Exercise Goals    Row Name 06/02/20 1348             Exercise Goals   Increase Physical Activity Yes       Intervention Provide advice, education, support and counseling about physical activity/exercise needs.;Develop an individualized exercise prescription for aerobic and resistive training based on initial evaluation findings, risk stratification, comorbidities and participant's personal goals.       Expected Outcomes Long Term: Add in home exercise to make exercise part of routine and to increase amount of physical activity.;Short Term: Attend rehab on a regular basis to increase amount of physical activity.;Long Term: Exercising regularly at least 3-5 days a week.       Increase Strength and Stamina Yes       Intervention Provide advice, education, support and counseling about physical activity/exercise needs.;Develop an individualized exercise prescription for aerobic and resistive training based on initial evaluation findings, risk stratification, comorbidities and participant's personal goals.       Expected Outcomes Short Term: Increase workloads from initial exercise prescription for resistance, speed, and  METs.;Short Term: Perform resistance training exercises routinely during rehab and add in resistance training at home;Long Term: Improve cardiorespiratory fitness, muscular endurance and strength as measured by increased METs and functional capacity (6MWT)       Able to understand and use rate of perceived exertion (RPE) scale Yes       Intervention Provide education and explanation on how to use RPE scale       Expected Outcomes Short Term: Able to use RPE daily in rehab to express subjective intensity level;Long Term:  Able to use RPE to guide intensity level when exercising independently       Able to understand and use Dyspnea scale Yes       Intervention Provide education and explanation on how to use Dyspnea scale       Expected Outcomes Short Term: Able to use  Dyspnea scale daily in rehab to express subjective sense of shortness of breath during exertion;Long Term: Able to use Dyspnea scale to guide intensity level when exercising independently       Knowledge and understanding of Target Heart Rate Range (THRR) Yes       Intervention Provide education and explanation of THRR including how the numbers were predicted and where they are located for reference       Expected Outcomes Short Term: Able to state/look up THRR;Short Term: Able to use daily as guideline for intensity in rehab;Long Term: Able to use THRR to govern intensity when exercising independently       Able to check pulse independently Yes       Intervention Provide education and demonstration on how to check pulse in carotid and radial arteries.;Review the importance of being able to check your own pulse for safety during independent exercise       Expected Outcomes Short Term: Able to explain why pulse checking is important during independent exercise;Long Term: Able to check pulse independently and accurately       Understanding of Exercise Prescription Yes       Intervention Provide education, explanation, and written materials on patient's individual exercise prescription       Expected Outcomes Short Term: Able to explain program exercise prescription;Long Term: Able to explain home exercise prescription to exercise independently              Copy of goals given to participant.

## 2020-06-04 ENCOUNTER — Other Ambulatory Visit: Payer: Self-pay

## 2020-06-04 DIAGNOSIS — R0609 Other forms of dyspnea: Secondary | ICD-10-CM | POA: Diagnosis not present

## 2020-06-04 DIAGNOSIS — R0600 Dyspnea, unspecified: Secondary | ICD-10-CM

## 2020-06-04 LAB — GLUCOSE, CAPILLARY
Glucose-Capillary: 92 mg/dL (ref 70–99)
Glucose-Capillary: 94 mg/dL (ref 70–99)

## 2020-06-04 NOTE — Progress Notes (Signed)
Daily Session Note  Patient Details  Name: Katherine Estes MRN: [id number] Date of Birth: 1960/07/30 Referring Provider:   Flowsheet Row Pulmonary Rehab from 06/02/2020 in [facility] Cardiac and Pulmonary Rehab  Referring Provider Hulan Fess MD      Encounter Date: 06/04/2020  Check In:  Session Check In - 06/04/20 1608      Check-In   Supervising physician immediately available to respond to emergencies See telemetry face sheet for immediately available ER MD    Location [facility]-Cardiac & Pulmonary Rehab    Staff Present Birdie Sons, MPA, Nino Glow, MS Exercise Physiologist;Amanda Oletta Darter, BA, ACSM CEP, Exercise Physiologist    Virtual Visit No    Medication changes reported     No    Fall or balance concerns reported    No    Warm-up and Cool-down Performed on first and last piece of equipment    Resistance Training Performed Yes    VAD Patient? No    PAD/SET Patient? No      Pain Assessment   Currently in Pain? No/denies              Social History   Tobacco Use  Smoking Status Former Smoker  . Packs/day: 0.25  . Years: 3.00  . Pack years: 0.75  . Types: Cigarettes  . Quit date: 05/21/1991  . Years since quitting: 29.0  Smokeless Tobacco Never Used  Tobacco Comment   she only smoked when she drank    Goals Met:  Independence with exercise equipment Exercise tolerated well No report of cardiac concerns or symptoms Strength training completed today  Goals Unmet:  Not Applicable  Comments: First full day of exercise!  Patient was oriented to gym and equipment including functions, settings, policies, and procedures.  Patient's individual exercise prescription and treatment plan were reviewed.  All starting workloads were established based on the results of the 6 minute walk test done at initial orientation visit.  The plan for exercise progression was also introduced and progression will be customized based on patient's performance and  goals.    Dr. Emily Filbert is Medical Director for [place] and [facility].

## 2020-06-05 ENCOUNTER — Other Ambulatory Visit: Payer: Self-pay

## 2020-06-05 ENCOUNTER — Encounter: Payer: Medicaid Other | Admitting: *Deleted

## 2020-06-05 DIAGNOSIS — R0609 Other forms of dyspnea: Secondary | ICD-10-CM | POA: Diagnosis not present

## 2020-06-05 DIAGNOSIS — R0600 Dyspnea, unspecified: Secondary | ICD-10-CM

## 2020-06-05 LAB — GLUCOSE, CAPILLARY
Glucose-Capillary: 90 mg/dL (ref 70–99)
Glucose-Capillary: 101 mg/dL — ABNORMAL HIGH (ref 70–99)

## 2020-06-05 NOTE — Progress Notes (Signed)
Daily Session Note  Patient Details  Name: Katherine Estes MRN: [id number] Date of Birth: December 27, 1960 Referring Provider:   Flowsheet Row Pulmonary Rehab from 06/02/2020 in [facility] Cardiac and Pulmonary Rehab  Referring Provider Hulan Fess MD      Encounter Date: 06/05/2020  Check In:  Session Check In - 06/05/20 1556      Check-In   Supervising physician immediately available to respond to emergencies See telemetry face sheet for immediately available ER MD    Location [facility]-Cardiac & Pulmonary Rehab    Staff Present [name], RN BSN;Joseph [address] [place], [place], RCEP, CCRP, CCET    Virtual Visit No    Medication changes reported     No    Fall or balance concerns reported    No    Warm-up and Cool-down Performed on first and last piece of equipment    Resistance Training Performed Yes    VAD Patient? No    PAD/SET Patient? No      Pain Assessment   Currently in Pain? No/denies              Social History   Tobacco Use  Smoking Status Former Smoker  . Packs/day: 0.25  . Years: 3.00  . Pack years: 0.75  . Types: Cigarettes  . Quit date: 05/21/1991  . Years since quitting: 29.0  Smokeless Tobacco Never Used  Tobacco Comment   she only smoked when she drank    Goals Met:  Independence with exercise equipment Exercise tolerated well No report of cardiac concerns or symptoms Strength training completed today  Goals Unmet:  Not Applicable  Comments: Pt able to follow exercise prescription today without complaint.  Will continue to monitor for progression.    Dr. Emily Filbert is Medical Director for [place] and [facility].

## 2020-06-09 ENCOUNTER — Other Ambulatory Visit: Payer: Self-pay

## 2020-06-09 DIAGNOSIS — R0600 Dyspnea, unspecified: Secondary | ICD-10-CM

## 2020-06-09 DIAGNOSIS — R0609 Other forms of dyspnea: Secondary | ICD-10-CM | POA: Diagnosis not present

## 2020-06-09 LAB — GLUCOSE, CAPILLARY
Glucose-Capillary: 120 mg/dL — ABNORMAL HIGH (ref 70–99)
Glucose-Capillary: 86 mg/dL (ref 70–99)
Glucose-Capillary: 108 mg/dL — ABNORMAL HIGH (ref 70–99)

## 2020-06-09 NOTE — Progress Notes (Signed)
Daily Session Note  Patient Details  Name: Katherine Estes MRN: [id number] Date of Birth: September 21, 1960 Referring Provider:   Flowsheet Row Pulmonary Rehab from 06/02/2020 in [facility] Cardiac and Pulmonary Rehab  Referring Provider Hulan Fess MD      Encounter Date: 06/09/2020  Check In:  Session Check In - 06/09/20 1605      Check-In   Supervising physician immediately available to respond to emergencies See telemetry face sheet for immediately available ER MD    Location [facility]-Cardiac & Pulmonary Rehab    Staff Present Birdie Sons, MPA, Mauricia Area, BS, ACSM CEP, Exercise Physiologist;Kara Eliezer Bottom, MS Exercise Physiologist    Virtual Visit No    Medication changes reported     No    Fall or balance concerns reported    No    Warm-up and Cool-down Performed on first and last piece of equipment    Resistance Training Performed Yes    VAD Patient? No    PAD/SET Patient? No      Pain Assessment   Currently in Pain? No/denies              Social History   Tobacco Use  Smoking Status Former Smoker  . Packs/day: 0.25  . Years: 3.00  . Pack years: 0.75  . Types: Cigarettes  . Quit date: 05/21/1991  . Years since quitting: 29.0  Smokeless Tobacco Never Used  Tobacco Comment   she only smoked when she drank    Goals Met:  Independence with exercise equipment Exercise tolerated well No report of cardiac concerns or symptoms Strength training completed today  Goals Unmet:  Not Applicable  Comments: Pt able to follow exercise prescription today without complaint.  Will continue to monitor for progression.    Dr. Emily Filbert is Medical Director for [place] and [facility].

## 2020-06-11 ENCOUNTER — Other Ambulatory Visit: Payer: Self-pay

## 2020-06-11 ENCOUNTER — Encounter: Payer: Self-pay | Admitting: *Deleted

## 2020-06-11 DIAGNOSIS — R0609 Other forms of dyspnea: Secondary | ICD-10-CM

## 2020-06-11 DIAGNOSIS — R0600 Dyspnea, unspecified: Secondary | ICD-10-CM

## 2020-06-11 LAB — GLUCOSE, CAPILLARY
Glucose-Capillary: 128 mg/dL — ABNORMAL HIGH (ref 70–99)
Glucose-Capillary: 82 mg/dL (ref 70–99)
Glucose-Capillary: 95 mg/dL (ref 70–99)

## 2020-06-11 NOTE — Progress Notes (Signed)
Daily Session Note  Patient Details  Name: Katherine Estes MRN: [id number] Date of Birth: 1960/04/23 Referring Provider:   Flowsheet Row Pulmonary Rehab from 06/02/2020 in [facility] Cardiac and Pulmonary Rehab  Referring Provider Hulan Fess MD      Encounter Date: 06/11/2020  Check In:  Session Check In - 06/11/20 1604      Check-In   Supervising physician immediately available to respond to emergencies See telemetry face sheet for immediately available ER MD    Location [facility]-Cardiac & Pulmonary Rehab    Staff Present Birdie Sons, MPA, RN;Meredith Sherryll Burger, RN Margurite Auerbach, MS Exercise Physiologist    Virtual Visit No    Medication changes reported     No    Fall or balance concerns reported    No    Warm-up and Cool-down Performed on first and last piece of equipment    Resistance Training Performed Yes    VAD Patient? No    PAD/SET Patient? No      Pain Assessment   Currently in Pain? No/denies              Social History   Tobacco Use  Smoking Status Former Smoker  . Packs/day: 0.25  . Years: 3.00  . Pack years: 0.75  . Types: Cigarettes  . Quit date: 05/21/1991  . Years since quitting: 29.0  Smokeless Tobacco Never Used  Tobacco Comment   she only smoked when she drank    Goals Met:  Independence with exercise equipment Exercise tolerated well No report of cardiac concerns or symptoms Strength training completed today  Goals Unmet:  Not Applicable  Comments: Pt able to follow exercise prescription today without complaint.  Will continue to monitor for progression.    Dr. Emily Filbert is Medical Director for [place] and [facility].

## 2020-06-11 NOTE — Progress Notes (Signed)
Pulmonary Individual Treatment Plan  Patient Details  Name: Katherine Estes MRN: [id number] Date of Birth: 07-03-1960 Referring Provider:   Flowsheet Row Pulmonary Rehab from 06/02/2020 in [facility] Cardiac and Pulmonary Rehab  Referring Provider Hulan Fess MD      Initial Encounter Date:  Flowsheet Row Pulmonary Rehab from 06/02/2020 in [facility] Cardiac and Pulmonary Rehab  Date 06/02/20      Visit Diagnosis: DOE (dyspnea on exertion)  Patient's Home Medications on Admission:  Current Outpatient Medications:  .  albuterol (PROVENTIL) (2.5 MG/3ML) 0.083% nebulizer solution, albuterol sulfate 2.5 mg/3 mL (0.083 %) solution for nebulization, Disp: , Rfl:  .  Albuterol Sulfate (PROAIR HFA IN), Inhale into the lungs as needed., Disp: , Rfl:  .  amLODipine (NORVASC) 5 MG tablet, Take 5 mg by mouth daily., Disp: , Rfl:  .  cholecalciferol (VITAMIN D) 1000 units tablet, Take 1,000 Units by mouth daily., Disp: , Rfl:  .  Fluticasone-Salmeterol (ADVAIR) 500-50 MCG/DOSE AEPB, Inhale 1 puff into the lungs every 12 (twelve) hours.   (Patient not taking: Reported on 05/26/2020), Disp: , Rfl:  .  Fluticasone-Salmeterol (ADVAIR) 500-50 MCG/DOSE AEPB, Advair Diskus 500 mcg-50 mcg/dose powder for inhalation, Disp: , Rfl:  .  hydrochlorothiazide (HYDRODIURIL) 25 MG tablet, Take 25 mg by mouth daily., Disp: , Rfl:  .  HYDROcodone-acetaminophen (VICODIN) 5-325 mg TABS tablet, Take 6 tablets by mouth 4 (four) times daily as needed (Pain). (Patient not taking: No sig reported), Disp: 20 tablet, Rfl: 0 .  ibuprofen (ADVIL,MOTRIN) 800 MG tablet, TAKE 1 TABLET (800 MG TOTAL) BY MOUTH EVERY 8 (EIGHT) HOURS AS NEEDED FOR PAIN., Disp: 30 tablet, Rfl: 1 .  levothyroxine (SYNTHROID) 150 MCG tablet, levothyroxine 150 mcg tablet (Patient not taking: Reported on 05/26/2020), Disp: , Rfl:  .  levothyroxine (SYNTHROID, LEVOTHROID) 175 MCG tablet, Take 175 mcg by mouth daily., Disp: , Rfl:  .  levothyroxine (SYNTHROID,  LEVOTHROID) 200 MCG tablet, Take 200 mcg by mouth daily before breakfast. (Patient not taking: No sig reported), Disp: , Rfl:  .  losartan (COZAAR) 50 MG tablet, Take 50 mg by mouth daily., Disp: , Rfl:  .  meclizine (ANTIVERT) 25 MG tablet, Take 1 tablet (25 mg total) by mouth 3 (three) times daily as needed for dizziness. Take until 24 hours without dizziness (Patient not taking: No sig reported), Disp: 30 tablet, Rfl: 0 .  meloxicam (MOBIC) 7.5 MG tablet, meloxicam 7.5 mg tablet  TAKE 1 TABLET BY MOUTH EVERY DAY FOR 30 DAYS, Disp: , Rfl:  .  metFORMIN (GLUCOPHAGE) 500 MG tablet, Take 500 mg by mouth 2 (two) times daily with a meal., Disp: , Rfl:  .  montelukast (SINGULAIR) 10 MG tablet, Take 10 mg by mouth at bedtime., Disp: , Rfl:  .  naproxen (NAPROSYN) 500 MG tablet, Take 500 mg by mouth 2 (two) times daily with a meal. (Patient not taking: No sig reported), Disp: , Rfl:  .  nystatin-triamcinolone ointment (MYCOLOG), Apply 1 application topically 2 (two) times daily. Apply a small amount to the nodular skin of the areola of both breasts twice a day. (Patient not taking: No sig reported), Disp: 30 g, Rfl: 0 .  Olmesartan-amLODIPine-HCTZ 20-5-12.5 MG TABS, Take 1 tablet by mouth daily. (Patient not taking: No sig reported), Disp: , Rfl:  .  ondansetron (ZOFRAN ODT) 4 MG disintegrating tablet, Take 1 tablet (4 mg total) by mouth every 8 (eight) hours as needed for nausea. (Patient not taking: No sig reported), Disp: 6  tablet, Rfl: 0 .  potassium chloride (KLOR-CON) 10 MEQ tablet, Take 10 mEq by mouth daily., Disp: , Rfl:  .  potassium chloride (MICRO-K) 10 MEQ CR capsule, Take 10 mEq by mouth daily. (Patient not taking: Reported on 05/26/2020), Disp: , Rfl:  .  PROVENTIL HFA 108 (90 Base) MCG/ACT inhaler, Inhale 2 puff as directed every four hours as needed, Disp: , Rfl:  .  sertraline (ZOLOFT) 50 MG tablet, Take 50 mg by mouth daily. (Patient not taking: No sig reported), Disp: , Rfl:   Past Medical  History: Past Medical History:  Diagnosis Date  . Allergy   . Anemia   . Arthritis   . Asthma   . Diabetes mellitus without complication ([place])   . Hypertension   . Hypothyroidism   . Osteoporosis   . Thyroid disease     Tobacco Use: Social History   Tobacco Use  Smoking Status Former Smoker  . Packs/day: 0.25  . Years: 3.00  . Pack years: 0.75  . Types: Cigarettes  . Quit date: 05/21/1991  . Years since quitting: 29.0  Smokeless Tobacco Never Used  Tobacco Comment   she only smoked when she drank    Labs: Recent Review Flowsheet Data   There is no flowsheet data to display.      Pulmonary Assessment Scores:  Pulmonary Assessment Scores    Row Name 06/02/20 1351         ADL UCSD   ADL Phase Entry     SOB Score total 79     Rest 0     Walk 3     Stairs 5     Bath 3     Dress 2     Shop 5           CAT Score   CAT Score 26           mMRC Score   mMRC Score 4            UCSD: Self-administered rating of dyspnea associated with activities of daily living (ADLs) 6-point scale (0 = "not at all" to 5 = "maximal or unable to do because of breathlessness")  Scoring Scores range from 0 to 120.  Minimally important difference is 5 units  CAT: CAT can identify the health impairment of COPD patients and is better correlated with disease progression.  CAT has a scoring range of zero to 40. The CAT score is classified into four groups of low (less than 10), medium (10 - 20), high (21-30) and very high (31-40) based on the impact level of disease on health status. A CAT score over 10 suggests significant symptoms.  A worsening CAT score could be explained by an exacerbation, poor medication adherence, poor inhaler technique, or progression of COPD or comorbid conditions.  CAT MCID is 2 points  mMRC: mMRC (Modified Medical Research Council) Dyspnea Scale is used to assess the degree of baseline functional disability in patients of respiratory disease due to  dyspnea. No minimal important difference is established. A decrease in score of 1 point or greater is considered a positive change.   Pulmonary Function Assessment:  Pulmonary Function Assessment - 05/26/20 1409      Breath   Shortness of Breath Limiting activity;Yes           Exercise Target Goals: Exercise Program Goal: Individual exercise prescription set using results from initial 6 min walk test and THRR while considering  patient's activity barriers and safety.  Exercise Prescription Goal: Initial exercise prescription builds to 30-45 minutes a day of aerobic activity, 2-3 days per week.  Home exercise guidelines will be given to patient during program as part of exercise prescription that the participant will acknowledge.  Education: Aerobic Exercise: - Group verbal and visual presentation on the components of exercise prescription. Introduces F.I.T.T principle from ACSM for exercise prescriptions.  Reviews F.I.T.T. principles of aerobic exercise including progression. Written material given at graduation.   Education: Resistance Exercise: - Group verbal and visual presentation on the components of exercise prescription. Introduces F.I.T.T principle from ACSM for exercise prescriptions  Reviews F.I.T.T. principles of resistance exercise including progression. Written material given at graduation.    Education: Exercise & Equipment Safety: - Individual verbal instruction and demonstration of equipment use and safety with use of the equipment. Flowsheet Row Pulmonary Rehab from 06/02/2020 in [facility] Cardiac and Pulmonary Rehab  Date 05/26/20  Educator [facility]  Instruction Review Code 1- Verbalizes Understanding      Education: Exercise Physiology & General Exercise Guidelines: - Group verbal and written instruction with models to review the exercise physiology of the cardiovascular system and associated critical values. Provides general exercise guidelines with specific guidelines  to those with heart or lung disease.    Education: Flexibility, Balance, Mind/Body Relaxation: - Group verbal and visual presentation with interactive activity on the components of exercise prescription. Introduces F.I.T.T principle from ACSM for exercise prescriptions. Reviews F.I.T.T. principles of flexibility and balance exercise training including progression. Also discusses the mind body connection.  Reviews various relaxation techniques to help reduce and manage stress (i.e. Deep breathing, progressive muscle relaxation, and visualization). Balance handout provided to take home. Written material given at graduation.   Activity Barriers & Risk Stratification:  Activity Barriers & Cardiac Risk Stratification - 06/02/20 1342      Activity Barriers & Cardiac Risk Stratification   Activity Barriers Arthritis;Deconditioning;Muscular Weakness;Shortness of Breath;Balance Concerns;Joint Problems;Back Problems   chronic knee pain (hurts all the time), spinal deterioration          6 Minute Walk:  6 Minute Walk    Row Name 06/02/20 1340         6 Minute Walk   Phase Initial     Distance 700 feet     Walk Time 4.96 minutes     # of Rest Breaks 4  10 sec, 22 sec, 15 sec, 15 sec     MPH 1.6     METS 1.76     RPE 13     Perceived Dyspnea  1     VO2 Peak 6.17     Symptoms Yes (comment)     Comments SOB, chronic knee pain     Resting HR 76 bpm     Resting BP 126/64     Resting Oxygen Saturation  996 %     Exercise Oxygen Saturation  during 6 min walk 94 %     Max Ex. HR 98 bpm     Max Ex. BP 150/64     2 Minute Post BP 144/66           Interval HR   1 Minute HR 86     2 Minute HR 91     3 Minute HR 89     4 Minute HR 94     5 Minute HR 85     6 Minute HR 98     2 Minute Post HR 75     Interval Heart  Rate? Yes           Interval Oxygen   Interval Oxygen? Yes     Baseline Oxygen Saturation % 96 %     1 Minute Oxygen Saturation % 95 %     1 Minute Liters of Oxygen 0 L   Room Air     2 Minute Oxygen Saturation % 94 %     2 Minute Liters of Oxygen 0 L     3 Minute Oxygen Saturation % 94 %     3 Minute Liters of Oxygen 0 L     4 Minute Oxygen Saturation % 95 %     4 Minute Liters of Oxygen 0 L     5 Minute Oxygen Saturation % 94 %     5 Minute Liters of Oxygen 0 L     6 Minute Oxygen Saturation % 94 %     6 Minute Liters of Oxygen 0 L     2 Minute Post Oxygen Saturation % 95 %     2 Minute Post Liters of Oxygen 0 L           Oxygen Initial Assessment:  Oxygen Initial Assessment - 05/26/20 1405      Home Oxygen   Home Oxygen Device None    Sleep Oxygen Prescription None    Home Exercise Oxygen Prescription None    Home Resting Oxygen Prescription None      Initial 6 min Walk   Oxygen Used None      Program Oxygen Prescription   Program Oxygen Prescription None      Intervention   Short Term Goals To learn and demonstrate proper use of respiratory medications;To learn and demonstrate proper pursed lip breathing techniques or other breathing techniques.;To learn and understand importance of maintaining oxygen saturations>88%;To learn and understand importance of monitoring SPO2 with pulse oximeter and demonstrate accurate use of the pulse oximeter.    Long  Term Goals Maintenance of O2 saturations>88%;Verbalizes importance of monitoring SPO2 with pulse oximeter and return demonstration;Exhibits proper breathing techniques, such as pursed lip breathing or other method taught during program session;Compliance with respiratory medication;Demonstrates proper use of MDI's           Oxygen Re-Evaluation:  Oxygen Re-Evaluation    Row Name 06/04/20 1609             Program Oxygen Prescription   Program Oxygen Prescription None               Home Oxygen   Home Oxygen Device None       Sleep Oxygen Prescription None       Home Exercise Oxygen Prescription None       Home Resting Oxygen Prescription None               Goals/Expected  Outcomes   Short Term Goals To learn and demonstrate proper pursed lip breathing techniques or other breathing techniques.;To learn and understand importance of maintaining oxygen saturations>88%;To learn and understand importance of monitoring SPO2 with pulse oximeter and demonstrate accurate use of the pulse oximeter.       Long  Term Goals Maintenance of O2 saturations>88%;Verbalizes importance of monitoring SPO2 with pulse oximeter and return demonstration;Exhibits proper breathing techniques, such as pursed lip breathing or other method taught during program session       Comments Reviewed PLB technique with pt.  Talked about how it works and it's importance in maintaining their exercise saturations.  Goals/Expected Outcomes Short: Become more profiecient at using PLB.   Long: Become independent at using PLB.              Oxygen Discharge (Final Oxygen Re-Evaluation):  Oxygen Re-Evaluation - 06/04/20 1609      Program Oxygen Prescription   Program Oxygen Prescription None      Home Oxygen   Home Oxygen Device None    Sleep Oxygen Prescription None    Home Exercise Oxygen Prescription None    Home Resting Oxygen Prescription None      Goals/Expected Outcomes   Short Term Goals To learn and demonstrate proper pursed lip breathing techniques or other breathing techniques.;To learn and understand importance of maintaining oxygen saturations>88%;To learn and understand importance of monitoring SPO2 with pulse oximeter and demonstrate accurate use of the pulse oximeter.    Long  Term Goals Maintenance of O2 saturations>88%;Verbalizes importance of monitoring SPO2 with pulse oximeter and return demonstration;Exhibits proper breathing techniques, such as pursed lip breathing or other method taught during program session    Comments Reviewed PLB technique with pt.  Talked about how it works and it's importance in maintaining their exercise saturations.    Goals/Expected Outcomes Short:  Become more profiecient at using PLB.   Long: Become independent at using PLB.           Initial Exercise Prescription:  Initial Exercise Prescription - 06/02/20 1300      Date of Initial Exercise RX and Referring Provider   Date 06/02/20    Referring Provider Hulan Fess MD      Treadmill   MPH 1.4    Grade 0.5    Minutes 15    METs 2.17      Recumbant Bike   Level 1    RPM 50    Watts 8    Minutes 15    METs 1.7      NuStep   Level 1    SPM 80    Minutes 15    METs 1.7      REL-XR   Level 1    Speed 50    Minutes 15    METs 1.7      Prescription Details   Frequency (times per week) 3    Duration Progress to 30 minutes of continuous aerobic without signs/symptoms of physical distress      Intensity   THRR 40-80% of Max Heartrate 107-143    Ratings of Perceived Exertion 11-13    Perceived Dyspnea 0-4      Progression   Progression Continue to progress workloads to maintain intensity without signs/symptoms of physical distress.      Resistance Training   Training Prescription Yes    Weight 3 lb    Reps 10-15           Perform Capillary Blood Glucose checks as needed.  Exercise Prescription Changes:  Exercise Prescription Changes    Row Name 06/02/20 1300 06/10/20 1500           Response to Exercise   Blood Pressure (Admit) 126/64 132/82      Blood Pressure (Exercise) 150/64 146/80      Blood Pressure (Exit) 126/66 124/80      Heart Rate (Admit) 76 bpm 77 bpm      Heart Rate (Exercise) 98 bpm 90 bpm      Heart Rate (Exit) 71 bpm 87 bpm      Oxygen Saturation (Admit) 96 % 95 %  Oxygen Saturation (Exercise) 94 % 97 %      Oxygen Saturation (Exit) 96 % 96 %      Rating of Perceived Exertion (Exercise) 13 13      Perceived Dyspnea (Exercise) 1 0      Symptoms SOB, chronic knee pain none      Comments walk test results third full day of exercise      Duration -- Continue with 30 min of aerobic exercise without signs/symptoms of  physical distress.      Intensity -- THRR unchanged             Progression   Progression -- Continue to progress workloads to maintain intensity without signs/symptoms of physical distress.      Average METs -- 1.8             Resistance Training   Training Prescription -- Yes      Weight -- 3 lb      Reps -- 10-15             Interval Training   Interval Training -- No             Recumbant Bike   Level -- 1      Minutes -- 15             NuStep   Level -- 1      Minutes -- 15      METs -- 1.6             Biostep-RELP   Level -- 1      Minutes -- 15      METs -- 2             Exercise Comments:  Exercise Comments    Row Name 06/04/20 1609           Exercise Comments First full day of exercise!  Patient was oriented to gym and equipment including functions, settings, policies, and procedures.  Patient's individual exercise prescription and treatment plan were reviewed.  All starting workloads were established based on the results of the 6 minute walk test done at initial orientation visit.  The plan for exercise progression was also introduced and progression will be customized based on patient's performance and goals.              Exercise Goals and Review:  Exercise Goals    Row Name 06/02/20 1348             Exercise Goals   Increase Physical Activity Yes       Intervention Provide advice, education, support and counseling about physical activity/exercise needs.;Develop an individualized exercise prescription for aerobic and resistive training based on initial evaluation findings, risk stratification, comorbidities and participant's personal goals.       Expected Outcomes Long Term: Add in home exercise to make exercise part of routine and to increase amount of physical activity.;Short Term: Attend rehab on a regular basis to increase amount of physical activity.;Long Term: Exercising regularly at least 3-5 days a week.       Increase Strength and  Stamina Yes       Intervention Provide advice, education, support and counseling about physical activity/exercise needs.;Develop an individualized exercise prescription for aerobic and resistive training based on initial evaluation findings, risk stratification, comorbidities and participant's personal goals.       Expected Outcomes Short Term: Increase workloads from initial exercise prescription for resistance, speed, and METs.;Short Term: Perform resistance training  exercises routinely during rehab and add in resistance training at home;Long Term: Improve cardiorespiratory fitness, muscular endurance and strength as measured by increased METs and functional capacity (6MWT)       Able to understand and use rate of perceived exertion (RPE) scale Yes       Intervention Provide education and explanation on how to use RPE scale       Expected Outcomes Short Term: Able to use RPE daily in rehab to express subjective intensity level;Long Term:  Able to use RPE to guide intensity level when exercising independently       Able to understand and use Dyspnea scale Yes       Intervention Provide education and explanation on how to use Dyspnea scale       Expected Outcomes Short Term: Able to use Dyspnea scale daily in rehab to express subjective sense of shortness of breath during exertion;Long Term: Able to use Dyspnea scale to guide intensity level when exercising independently       Knowledge and understanding of Target Heart Rate Range (THRR) Yes       Intervention Provide education and explanation of THRR including how the numbers were predicted and where they are located for reference       Expected Outcomes Short Term: Able to state/look up THRR;Short Term: Able to use daily as guideline for intensity in rehab;Long Term: Able to use THRR to govern intensity when exercising independently       Able to check pulse independently Yes       Intervention Provide education and demonstration on how to check pulse  in carotid and radial arteries.;Review the importance of being able to check your own pulse for safety during independent exercise       Expected Outcomes Short Term: Able to explain why pulse checking is important during independent exercise;Long Term: Able to check pulse independently and accurately       Understanding of Exercise Prescription Yes       Intervention Provide education, explanation, and written materials on patient's individual exercise prescription       Expected Outcomes Short Term: Able to explain program exercise prescription;Long Term: Able to explain home exercise prescription to exercise independently              Exercise Goals Re-Evaluation :  Exercise Goals Re-Evaluation    Row Name 06/04/20 1609 06/10/20 1544           Exercise Goal Re-Evaluation   Exercise Goals Review Increase Physical Activity;Able to understand and use rate of perceived exertion (RPE) scale;Knowledge and understanding of Target Heart Rate Range (THRR);Understanding of Exercise Prescription;Able to check pulse independently;Able to understand and use Dyspnea scale;Increase Strength and Stamina Increase Physical Activity;Increase Strength and Stamina;Understanding of Exercise Prescription      Comments Reviewed RPE and dyspnea scales, THR and program prescription with pt today.  Pt voiced understanding and was given a copy of goals to take home. Katherine Estes is off to a good start in rehab.  She has completed her first three full days of rehab.  We will start to increase her workloads as the BioStep was an RPE of 8 on her last visit.  We will continue to monitor her progress.      Expected Outcomes Short: Use RPE daily to regulate intensity. Long: Follow program prescription in THR. Short: Increase workloads Long: Continue to follow program prescription             Discharge Exercise Prescription (  Final Exercise Prescription Changes):  Exercise Prescription Changes - 06/10/20 1500      Response to  Exercise   Blood Pressure (Admit) 132/82    Blood Pressure (Exercise) 146/80    Blood Pressure (Exit) 124/80    Heart Rate (Admit) 77 bpm    Heart Rate (Exercise) 90 bpm    Heart Rate (Exit) 87 bpm    Oxygen Saturation (Admit) 95 %    Oxygen Saturation (Exercise) 97 %    Oxygen Saturation (Exit) 96 %    Rating of Perceived Exertion (Exercise) 13    Perceived Dyspnea (Exercise) 0    Symptoms none    Comments third full day of exercise    Duration Continue with 30 min of aerobic exercise without signs/symptoms of physical distress.    Intensity THRR unchanged      Progression   Progression Continue to progress workloads to maintain intensity without signs/symptoms of physical distress.    Average METs 1.8      Resistance Training   Training Prescription Yes    Weight 3 lb    Reps 10-15      Interval Training   Interval Training No      Recumbant Bike   Level 1    Minutes 15      NuStep   Level 1    Minutes 15    METs 1.6      Biostep-RELP   Level 1    Minutes 15    METs 2           Nutrition:  Target Goals: Understanding of nutrition guidelines, daily intake of sodium 1500mg , cholesterol 200mg , calories 30% from fat and 7% or less from saturated fats, daily to have 5 or more servings of fruits and vegetables.  Education: All About Nutrition: -Group instruction provided by verbal, written material, interactive activities, discussions, models, and posters to present general guidelines for heart healthy nutrition including fat, fiber, MyPlate, the role of sodium in heart healthy nutrition, utilization of the nutrition label, and utilization of this knowledge for meal planning. Follow up email sent as well. Written material given at graduation.   Biometrics:  Pre Biometrics - 06/02/20 1348      Pre Biometrics   Height 5' 6.1" (1.679 m)    Weight 258 lb 1.6 oz (117.1 kg)    BMI (Calculated) 41.53    Single Leg Stand 1.2 seconds            Nutrition  Therapy Plan and Nutrition Goals:   Nutrition Assessments:  MEDIFICTS Score Key:  ?70 Need to make dietary changes   40-70 Heart Healthy Diet  ? 40 Therapeutic Level Cholesterol Diet  Flowsheet Row Pulmonary Rehab from 06/02/2020 in [facility] Cardiac and Pulmonary Rehab  Picture Your Plate Total Score on Admission 76     Picture Your Plate Scores:  <78 Unhealthy dietary pattern with much room for improvement.  41-50 Dietary pattern unlikely to meet recommendations for good health and room for improvement.  51-60 More healthful dietary pattern, with some room for improvement.   >60 Healthy dietary pattern, although there may be some specific behaviors that could be improved.   Nutrition Goals Re-Evaluation:   Nutrition Goals Discharge (Final Nutrition Goals Re-Evaluation):   Psychosocial: Target Goals: Acknowledge presence or absence of significant depression and/or stress, maximize coping skills, provide positive support system. Participant is able to verbalize types and ability to use techniques and skills needed for reducing stress and depression.   Education:  Stress, Anxiety, and Depression - Group verbal and visual presentation to define topics covered.  Reviews how body is impacted by stress, anxiety, and depression.  Also discusses healthy ways to reduce stress and to treat/manage anxiety and depression.  Written material given at graduation.   Education: Sleep Hygiene -Provides group verbal and written instruction about how sleep can affect your health.  Define sleep hygiene, discuss sleep cycles and impact of sleep habits. Review good sleep hygiene tips.    Initial Review & Psychosocial Screening:  Initial Psych Review & Screening - 05/26/20 1413      Initial Review   Current issues with None Identified;Current Anxiety/Panic;History of Depression      Family Dynamics   Good Support System? Yes    Comments She has a son, aunt and lives with her 74 year old mom.  Her two sisters also live in town. She has a good outlook on her health.      Barriers   Psychosocial barriers to participate in program The patient should benefit from training in stress management and relaxation.      Screening Interventions   Interventions Encouraged to exercise;Program counselor consult;To provide support and resources with identified psychosocial needs;Provide feedback about the scores to participant    Expected Outcomes Short Term goal: Utilizing psychosocial counselor, staff and physician to assist with identification of specific Stressors or current issues interfering with healing process. Setting desired goal for each stressor or current issue identified.;Short Term goal: Identification and review with participant of any Quality of Life or Depression concerns found by scoring the questionnaire.;Long Term Goal: Stressors or current issues are controlled or eliminated.;Long Term goal: The participant improves quality of Life and PHQ9 Scores as seen by post scores and/or verbalization of changes           Quality of Life Scores:  Scores of 19 and below usually indicate a poorer quality of life in these areas.  A difference of  2-3 points is a clinically meaningful difference.  A difference of 2-3 points in the total score of the Quality of Life Index has been associated with significant improvement in overall quality of life, self-image, physical symptoms, and general health in studies assessing change in quality of life.  PHQ-9: Recent Review Flowsheet Data    Depression screen [facility] 2/9 06/02/2020   Decreased Interest 3   Down, Depressed, Hopeless 0   PHQ - 2 Score 3   Altered sleeping 3   Tired, decreased energy 3   Change in appetite 3   Feeling bad or failure about yourself  0   Trouble concentrating 3   Moving slowly or fidgety/restless 0   Suicidal thoughts 0   PHQ-9 Score 15   Difficult doing work/chores Somewhat difficult     Interpretation of Total  Score  Total Score Depression Severity:  1-4 = Minimal depression, 5-9 = Mild depression, 10-14 = Moderate depression, 15-19 = Moderately severe depression, 20-27 = Severe depression   Psychosocial Evaluation and Intervention:  Psychosocial Evaluation - 05/26/20 1419      Psychosocial Evaluation & Interventions   Interventions Encouraged to exercise with the program and follow exercise prescription;Relaxation education;Stress management education    Comments She has a son, aunt and lives with her 63 year old mom. Her two sisters also live in town. She has a good outlook on her health.    Expected Outcomes Short: Exercise regularly to support mental health and notify staff of any changes. Long: maintain mental health  and well being through teaching of rehab or prescribed medications independently.    Continue Psychosocial Services  Follow up required by staff           Psychosocial Re-Evaluation:   Psychosocial Discharge (Final Psychosocial Re-Evaluation):   Education: Education Goals: Education classes will be provided on a weekly basis, covering required topics. Participant will state understanding/return demonstration of topics presented.  Learning Barriers/Preferences:  Learning Barriers/Preferences - 05/26/20 1409      Learning Barriers/Preferences   Learning Barriers None    Learning Preferences None           General Pulmonary Education Topics:  Infection Prevention: - Provides verbal and written material to individual with discussion of infection control including proper hand washing and proper equipment cleaning during exercise session. Flowsheet Row Pulmonary Rehab from 06/02/2020 in [facility] Cardiac and Pulmonary Rehab  Date 05/26/20  Educator [facility]  Instruction Review Code 1- Verbalizes Understanding      Falls Prevention: - Provides verbal and written material to individual with discussion of falls prevention and safety. Flowsheet Row Pulmonary Rehab from  06/02/2020 in [facility] Card[facility] and Pulmonary Rehab  Date 05/26/20  Educator [facility]  Instruction Review Code 1- Verbalizes Understanding      Chronic Lung Disease Review: - Group verbal instruction with posters, models, PowerPoint presentations and videos,  to review new updates, new respiratory medications, new advancements in procedures and treatments. Providing information on websites and "800" numbers for continued self-education. Includes information about supplement oxygen, available portable oxygen systems, continuous and intermittent flow rates, oxygen safety, concentrators, and Medicare reimbursement for oxygen. Explanation of Pulmonary Drugs, including class, frequency, complications, importance of spacers, rinsing mouth after steroid MDI's, and proper cleaning methods for nebulizers. Review of basic lung anatomy and physiology related to function, structure, and complications of lung disease. Review of risk factors. Discussion about methods for diagnosing sleep apnea and types of masks and machines for OSA. Includes a review of the use of types of environmental controls: home humidity, furnaces, filters, dust mite/pet prevention, HEPA vacuums. Discussion about weather changes, air quality and the benefits of nasal washing. Instruction on Warning signs, infection symptoms, calling MD promptly, preventive modes, and value of vaccinations. Review of effective airway clearance, coughing and/or vibration techniques. Emphasizing that all should Create an Action Plan. Written material given at graduation. Flowsheet Row Pulmonary Rehab from 06/02/2020 in [facility] Cardiac and Pulmonary Rehab  Education need identified 06/02/20      AED/CPR: - Group verbal and written instruction with the use of models to demonstrate the basic use of the AED with the basic ABC's of resuscitation.    Anatomy and Cardiac Procedures: - Group verbal and visual presentation and models provide information about basic cardiac anatomy  and function. Reviews the testing methods done to diagnose heart disease and the outcomes of the test results. Describes the treatment choices: Medical Management, Angioplasty, or Coronary Bypass Surgery for treating various heart conditions including Myocardial Infarction, Angina, Valve Disease, and Cardiac Arrhythmias.  Written material given at graduation.   Medication Safety: - Group verbal and visual instruction to review commonly prescribed medications for heart and lung disease. Reviews the medication, class of the drug, and side effects. Includes the steps to properly store meds and maintain the prescription regimen.  Written material given at graduation.   Other: -Provides group and verbal instruction on various topics (see comments)   Knowledge Questionnaire Score:  Knowledge Questionnaire Score - 06/02/20 1349      Knowledge Questionnaire Score  Pre Score 16/18 Education Focus: O2 safety            Core Components/Risk Factors/Patient Goals at Admission:  Personal Goals and Risk Factors at Admission - 06/02/20 1350      Core Components/Risk Factors/Patient Goals on Admission    Weight Management Yes;Weight Loss;Obesity    Intervention Weight Management: Develop a combined nutrition and exercise program designed to reach desired caloric intake, while maintaining appropriate intake of nutrient and fiber, sodium and fats, and appropriate energy expenditure required for the weight goal.;Weight Management: Provide education and appropriate resources to help participant work on and attain dietary goals.;Weight Management/Obesity: Establish reasonable short term and long term weight goals.;Obesity: Provide education and appropriate resources to help participant work on and attain dietary goals.    Admit Weight 258 lb 1.6 oz (117.1 kg)    Goal Weight: Short Term 250 lb (113.4 kg)    Goal Weight: Long Term 240 lb (108.9 kg)    Expected Outcomes Short Term: Continue to assess and  modify interventions until short term weight is achieved;Long Term: Adherence to nutrition and physical activity/exercise program aimed toward attainment of established weight goal;Weight Loss: Understanding of general recommendations for a balanced deficit meal plan, which promotes 1-2 lb weight loss per week and includes a negative energy balance of [phone] kcal/d;Understanding recommendations for meals to include 15-35% energy as protein, 25-35% energy from fat, 35-60% energy from carbohydrates, less than 200mg  of dietary cholesterol, 20-35 gm of total fiber daily;Understanding of distribution of calorie intake throughout the day with the consumption of 4-5 meals/snacks    Improve shortness of breath with ADL's Yes    Intervention Provide education, individualized exercise plan and daily activity instruction to help decrease symptoms of SOB with activities of daily living.    Expected Outcomes Short Term: Improve cardiorespiratory fitness to achieve a reduction of symptoms when performing ADLs;Long Term: Be able to perform more ADLs without symptoms or delay the onset of symptoms    Diabetes Yes    Intervention Provide education about signs/symptoms and action to take for hypo/hyperglycemia.;Provide education about proper nutrition, including hydration, and aerobic/resistive exercise prescription along with prescribed medications to achieve blood glucose in normal ranges: Fasting glucose 65-99 mg/dL    Expected Outcomes Short Term: Participant verbalizes understanding of the signs/symptoms and immediate care of hyper/hypoglycemia, proper foot care and importance of medication, aerobic/resistive exercise and nutrition plan for blood glucose control.;Long Term: Attainment of HbA1C < 7%.    Hypertension Yes    Intervention Provide education on lifestyle modifcations including regular physical activity/exercise, weight management, moderate sodium restriction and increased consumption of fresh fruit,  vegetables, and low fat dairy, alcohol moderation, and smoking cessation.;Monitor prescription use compliance.    Expected Outcomes Short Term: Continued assessment and intervention until BP is < 140/15mm HG in hypertensive participants. < 130/28mm HG in hypertensive participants with diabetes, heart failure or chronic kidney disease.;Long Term: Maintenance of blood pressure at goal levels.           Education:Diabetes - Individual verbal and written instruction to review signs/symptoms of diabetes, desired ranges of glucose level fasting, after meals and with exercise. Acknowledge that pre and post exercise glucose checks will be done for 3 sessions at entry of program. Flowsheet Row Pulmonary Rehab from 06/02/2020 in [facility] Cardiac and Pulmonary Rehab  Date 05/26/20  Educator [facility]  Instruction Review Code 1- Verbalizes Understanding      Know Your Numbers and Heart Failure: - Group verbal and visual instruction  to discuss disease risk factors for cardiac and pulmonary disease and treatment options.  Reviews associated critical values for Overweight/Obesity, Hypertension, Cholesterol, and Diabetes.  Discusses basics of heart failure: signs/symptoms and treatments.  Introduces Heart Failure Zone chart for action plan for heart failure.  Written material given at graduation.   Core Components/Risk Factors/Patient Goals Review:    Core Components/Risk Factors/Patient Goals at Discharge (Final Review):    ITP Comments:  ITP Comments    Row Name 05/26/20 1405 06/02/20 1340 06/04/20 1609 06/11/20 0719     ITP Comments Virtual Visit completed. Patient informed on EP and RD appointment and 6 Minute walk test. Patient also informed of patient health questionnaires on My Chart. Patient Verbalizes understanding. Visit diagnosis can be found in [facility] 05/14/2020. Completed 6MWT and gym orientation. Initial ITP created and sent for review to Dr. Emily Filbert, Medical Director. First full day of exercise!   Patient was oriented to gym and equipment including functions, settings, policies, and procedures.  Patient's individual exercise prescription and treatment plan were reviewed.  All starting workloads were established based on the results of the 6 minute walk test done at initial orientation visit.  The plan for exercise progression was also introduced and progression will be customized based on patient's performance and goals. 30 Day review completed. Medical Director ITP review done, changes made as directed, and signed approval by Medical Director.           Comments:

## 2020-06-12 ENCOUNTER — Other Ambulatory Visit: Payer: Self-pay

## 2020-06-12 ENCOUNTER — Encounter: Payer: Medicaid Other | Admitting: *Deleted

## 2020-06-12 DIAGNOSIS — R0600 Dyspnea, unspecified: Secondary | ICD-10-CM

## 2020-06-12 DIAGNOSIS — R0609 Other forms of dyspnea: Secondary | ICD-10-CM | POA: Diagnosis not present

## 2020-06-12 NOTE — Progress Notes (Signed)
Daily Session Note  Patient Details  Name: Katherine Estes MRN: [id number] Date of Birth: 04/24/1960 Referring Provider:   Flowsheet Row Pulmonary Rehab from 06/02/2020 in [facility] Cardiac and Pulmonary Rehab  Referring Provider Hulan Fess MD      Encounter Date: 06/12/2020  Check In:  Session Check In - 06/12/20 1622      Check-In   Supervising physician immediately available to respond to emergencies See telemetry face sheet for immediately available ER MD    Location [facility]-Cardiac & Pulmonary Rehab    Staff Present [name], RN BSN;Joseph [address] [place], [place], RCEP, CCRP, CCET    Virtual Visit No    Medication changes reported     No    Fall or balance concerns reported    No    Warm-up and Cool-down Performed on first and last piece of equipment    Resistance Training Performed Yes    VAD Patient? No    PAD/SET Patient? No      Pain Assessment   Currently in Pain? No/denies              Social History   Tobacco Use  Smoking Status Former Smoker  . Packs/day: 0.25  . Years: 3.00  . Pack years: 0.75  . Types: Cigarettes  . Quit date: 05/21/1991  . Years since quitting: 29.0  Smokeless Tobacco Never Used  Tobacco Comment   she only smoked when she drank    Goals Met:  Independence with exercise equipment Exercise tolerated well No report of cardiac concerns or symptoms Strength training completed today  Goals Unmet:  Not Applicable  Comments: Pt able to follow exercise prescription today without complaint.  Will continue to monitor for progression.    Dr. Emily Filbert is Medical Director for [place] and [facility].

## 2020-06-16 ENCOUNTER — Other Ambulatory Visit: Payer: Self-pay

## 2020-06-16 DIAGNOSIS — R0600 Dyspnea, unspecified: Secondary | ICD-10-CM

## 2020-06-16 DIAGNOSIS — R0609 Other forms of dyspnea: Secondary | ICD-10-CM | POA: Diagnosis not present

## 2020-06-16 NOTE — Progress Notes (Signed)
Daily Session Note  Patient Details  Name: Katherine Estes MRN: [id number] Date of Birth: 1960-04-09 Referring Provider:   Flowsheet Row Pulmonary Rehab from 06/02/2020 in [facility] Cardiac and Pulmonary Rehab  Referring Provider Hulan Fess MD      Encounter Date: 06/16/2020  Check In:  Session Check In - 06/16/20 1342      Check-In   Supervising physician immediately available to respond to emergencies See telemetry face sheet for immediately available ER MD    Location [facility]-Cardiac & Pulmonary Rehab    Staff Present Birdie Sons, MPA, Mauricia Area, BS, ACSM CEP, Exercise Physiologist;Meredith Sherryll Burger, RN BSN    Virtual Visit No    Medication changes reported     No    Fall or balance concerns reported    No    Warm-up and Cool-down Performed on first and last piece of equipment    Resistance Training Performed Yes    VAD Patient? No    PAD/SET Patient? No      Pain Assessment   Currently in Pain? No/denies              Social History   Tobacco Use  Smoking Status Former Smoker  . Packs/day: 0.25  . Years: 3.00  . Pack years: 0.75  . Types: Cigarettes  . Quit date: 05/21/1991  . Years since quitting: 29.0  Smokeless Tobacco Never Used  Tobacco Comment   she only smoked when she drank    Goals Met:  Independence with exercise equipment Exercise tolerated well No report of cardiac concerns or symptoms Strength training completed today  Goals Unmet:  Not Applicable  Comments: Pt able to follow exercise prescription today without complaint.  Will continue to monitor for progression.    Dr. Emily Filbert is Medical Director for [place] and [facility].

## 2020-06-18 ENCOUNTER — Other Ambulatory Visit: Payer: Self-pay

## 2020-06-18 ENCOUNTER — Encounter: Payer: Medicaid Other | Admitting: *Deleted

## 2020-06-18 DIAGNOSIS — R0600 Dyspnea, unspecified: Secondary | ICD-10-CM

## 2020-06-18 DIAGNOSIS — R0609 Other forms of dyspnea: Secondary | ICD-10-CM | POA: Diagnosis not present

## 2020-06-18 NOTE — Progress Notes (Signed)
Daily Session Note  Patient Details  Name: Katherine Estes MRN: [id number] Date of Birth: [birth year] Referring Provider:   Flowsheet Row Pulmonary Rehab from 06/02/2020 in [facility] Cardiac and Pulmonary Rehab  Referring Provider Hulan Fess MD      Encounter Date: 06/18/2020  Check In:  Session Check In - 06/18/20 1601      Check-In   Supervising physician immediately available to respond to emergencies See telemetry face sheet for immediately available ER MD    Location [facility]-Cardiac & Pulmonary Rehab    Staff Present [name], RN Sherryl Barters, MPA, RN;Melissa Caiola RDN, LDN    Virtual Visit No    Medication changes reported     No    Fall or balance concerns reported    No    Warm-up and Cool-down Performed on first and last piece of equipment    Resistance Training Performed Yes    VAD Patient? No    PAD/SET Patient? No      Pain Assessment   Currently in Pain? No/denies              Social History   Tobacco Use  Smoking Status Former Smoker  . Packs/day: 0.25  . Years: 3.00  . Pack years: 0.75  . Types: Cigarettes  . Quit date: 05/21/1991  . Years since quitting: 29.0  Smokeless Tobacco Never Used  Tobacco Comment   she only smoked when she drank    Goals Met:  Independence with exercise equipment Exercise tolerated well No report of cardiac concerns or symptoms Strength training completed today  Goals Unmet:  Not Applicable  Comments: Pt able to follow exercise prescription today without complaint.  Will continue to monitor for progression.    Dr. Emily Filbert is Medical Director for [place] and [facility].

## 2020-06-19 ENCOUNTER — Encounter: Payer: Medicaid Other | Admitting: *Deleted

## 2020-06-19 ENCOUNTER — Other Ambulatory Visit: Payer: Self-pay

## 2020-06-19 DIAGNOSIS — R0600 Dyspnea, unspecified: Secondary | ICD-10-CM

## 2020-06-19 DIAGNOSIS — R0609 Other forms of dyspnea: Secondary | ICD-10-CM

## 2020-06-19 NOTE — Progress Notes (Signed)
Daily Session Note  Patient Details  Name: Katherine Estes MRN: [id number] Date of Birth: 1960/04/27 Referring Provider:   Flowsheet Row Pulmonary Rehab from 06/02/2020 in [facility] Cardiac and Pulmonary Rehab  Referring Provider Hulan Fess MD      Encounter Date: 06/19/2020  Check In:  Session Check In - 06/19/20 1624      Check-In   Supervising physician immediately available to respond to emergencies See telemetry face sheet for immediately available ER MD    Location [facility]-Cardiac & Pulmonary Rehab    Staff Present [name], RN BSN;Joseph [address] [place], [place], RCEP, CCRP, CCET    Virtual Visit No    Medication changes reported     No    Fall or balance concerns reported    No    Warm-up and Cool-down Performed on first and last piece of equipment    Resistance Training Performed Yes    VAD Patient? No    PAD/SET Patient? No      Pain Assessment   Currently in Pain? No/denies              Social History   Tobacco Use  Smoking Status Former Smoker  . Packs/day: 0.25  . Years: 3.00  . Pack years: 0.75  . Types: Cigarettes  . Quit date: 05/21/1991  . Years since quitting: 29.1  Smokeless Tobacco Never Used  Tobacco Comment   she only smoked when she drank    Goals Met:  Independence with exercise equipment Exercise tolerated well No report of cardiac concerns or symptoms Strength training completed today  Goals Unmet:  Not Applicable  Comments: Pt able to follow exercise prescription today without complaint.  Will continue to monitor for progression.    Dr. Emily Filbert is Medical Director for [place] and [facility].

## 2020-06-23 ENCOUNTER — Other Ambulatory Visit: Payer: Self-pay

## 2020-06-23 ENCOUNTER — Encounter: Payer: Medicaid Other | Attending: Internal Medicine

## 2020-06-23 DIAGNOSIS — R0600 Dyspnea, unspecified: Secondary | ICD-10-CM | POA: Diagnosis not present

## 2020-06-23 DIAGNOSIS — R0609 Other forms of dyspnea: Secondary | ICD-10-CM

## 2020-06-23 NOTE — Progress Notes (Signed)
Daily Session Note  Patient Details  Name: Katherine Estes MRN: [id number] Date of Birth: [birth year] Referring Provider:   Flowsheet Row Pulmonary Rehab from 06/02/2020 in [facility] Cardiac and Pulmonary Rehab  Referring Provider Hulan Fess MD      Encounter Date: 06/23/2020  Check In:  Session Check In - 06/23/20 1616      Check-In   Supervising physician immediately available to respond to emergencies See telemetry face sheet for immediately available ER MD    Location [facility]-Cardiac & Pulmonary Rehab    Staff Present Birdie Sons, MPA, Mauricia Area, BS, ACSM CEP, Exercise Physiologist;Kara Eliezer Bottom, MS Exercise Physiologist    Virtual Visit No    Medication changes reported     No    Fall or balance concerns reported    No    Warm-up and Cool-down Performed on first and last piece of equipment    Resistance Training Performed Yes    VAD Patient? No    PAD/SET Patient? No      Pain Assessment   Currently in Pain? No/denies              Social History   Tobacco Use  Smoking Status Former Smoker  . Packs/day: 0.25  . Years: 3.00  . Pack years: 0.75  . Types: Cigarettes  . Quit date: 05/21/1991  . Years since quitting: 29.1  Smokeless Tobacco Never Used  Tobacco Comment   she only smoked when she drank    Goals Met:  Independence with exercise equipment Exercise tolerated well No report of cardiac concerns or symptoms Strength training completed today  Goals Unmet:  Not Applicable  Comments: Pt able to follow exercise prescription today without complaint.  Will continue to monitor for progression.    Dr. Emily Filbert is Medical Director for [place] and [facility].

## 2020-06-30 ENCOUNTER — Other Ambulatory Visit: Payer: Self-pay

## 2020-06-30 DIAGNOSIS — R0609 Other forms of dyspnea: Secondary | ICD-10-CM

## 2020-06-30 DIAGNOSIS — R0600 Dyspnea, unspecified: Secondary | ICD-10-CM

## 2020-06-30 NOTE — Progress Notes (Signed)
Daily Session Note  Patient Details  Name: Katherine Estes MRN: [id number] Date of Birth: 06-05-1960 Referring Provider:   Flowsheet Row Pulmonary Rehab from 06/02/2020 in [facility] Cardiac and Pulmonary Rehab  Referring Provider Hulan Fess MD      Encounter Date: 06/30/2020  Check In:  Session Check In - 06/30/20 1615      Check-In   Supervising physician immediately available to respond to emergencies See telemetry face sheet for immediately available ER MD    Location [facility]-Cardiac & Pulmonary Rehab    Staff Present Birdie Sons, MPA, Mauricia Area, BS, ACSM CEP, Exercise Physiologist;Kara Eliezer Bottom, MS Exercise Physiologist    Virtual Visit No    Medication changes reported     No    Fall or balance concerns reported    No    Warm-up and Cool-down Performed on first and last piece of equipment    Resistance Training Performed Yes    VAD Patient? No    PAD/SET Patient? No      Pain Assessment   Currently in Pain? No/denies              Social History   Tobacco Use  Smoking Status Former Smoker  . Packs/day: 0.25  . Years: 3.00  . Pack years: 0.75  . Types: Cigarettes  . Quit date: 05/21/1991  . Years since quitting: 29.1  Smokeless Tobacco Never Used  Tobacco Comment   she only smoked when she drank    Goals Met:  Independence with exercise equipment Exercise tolerated well No report of cardiac concerns or symptoms Strength training completed today  Goals Unmet:  Not Applicable  Comments: Pt able to follow exercise prescription today without complaint.  Will continue to monitor for progression.    Dr. Emily Filbert is Medical Director for [place] and [facility].

## 2020-07-02 ENCOUNTER — Other Ambulatory Visit: Payer: Self-pay

## 2020-07-02 DIAGNOSIS — R0600 Dyspnea, unspecified: Secondary | ICD-10-CM | POA: Diagnosis not present

## 2020-07-02 DIAGNOSIS — R0609 Other forms of dyspnea: Secondary | ICD-10-CM

## 2020-07-02 NOTE — Progress Notes (Signed)
Daily Session Note  Patient Details  Name: Katherine Estes MRN: [id number] Date of Birth: 31-Aug-1960 Referring Provider:   Flowsheet Row Pulmonary Rehab from 06/02/2020 in [facility]  Referring Provider Hulan Fess MD      Encounter Date: 07/02/2020  Check In:  Session Check In - 07/02/20 1631      Check-In   Supervising physician immediately available to respond to emergencies See telemetry face sheet for immediately available ER MD    Location [facility]-Cardiac & Pulmonary Rehab    Staff Present Birdie Sons, MPA, RN;Joseph Lou Miner, MS Exercise Physiologist;Melissa Caiola RDN, LDN    Virtual Visit No    Medication changes reported     No    Fall or balance concerns reported    No    Warm-up and Cool-down Performed on first and last piece of equipment    Resistance Training Performed Yes    VAD Patient? No    PAD/SET Patient? No      Pain Assessment   Currently in Pain? No/denies              Social History   Tobacco Use  Smoking Status Former Smoker  . Packs/day: 0.25  . Years: 3.00  . Pack years: 0.75  . Types: Cigarettes  . Quit date: 05/21/1991  . Years since quitting: 29.1  Smokeless Tobacco Never Used  Tobacco Comment   she only smoked when she drank    Goals Met:  Independence with exercise equipment Exercise tolerated well No report of cardiac concerns or symptoms Strength training completed today  Goals Unmet:  Not Applicable  Comments: Pt able to follow exercise prescription today without complaint.  Will continue to monitor for progression.    Dr. Emily Filbert is Medical Director for [facility].

## 2020-07-03 ENCOUNTER — Encounter: Payer: Medicaid Other | Admitting: *Deleted

## 2020-07-03 ENCOUNTER — Other Ambulatory Visit: Payer: Self-pay

## 2020-07-03 DIAGNOSIS — R0609 Other forms of dyspnea: Secondary | ICD-10-CM

## 2020-07-03 DIAGNOSIS — R0600 Dyspnea, unspecified: Secondary | ICD-10-CM

## 2020-07-03 NOTE — Progress Notes (Signed)
Daily Session Note  Patient Details  Name: Katherine Estes MRN: [id number] Date of Birth: May 13, 1960 Referring Provider:   Flowsheet Row [facility]  Referring Provider Katherine Fess MD      Encounter Date: 07/03/2020  Check In:  Session Check In - 07/03/20 1610      Check-In   Supervising physician immediately available to respond to emergencies See telemetry face sheet for immediately available ER MD    [facility]    Staff Present Katherine Papa, RN BSN;K[facility]    Virtual Visit No    Medication changes reported     No    Fall or balance concerns reported    No    Warm-up and Cool-down Performed on first and last piece of equipment    Resistance Training Performed Yes    VAD Patient? No    PAD/SET Patient? No      Pain Assessment   Currently in Pain? No/denies              Social History   Tobacco Use  Smoking Status Former Smoker  . Packs/day: 0.25  . Years: 3.00  . Pack years: 0.75  . Types: Cigarettes  . Quit date: 05/21/1991  . Years since quitting: 29.1  Smokeless Tobacco Never Used  Tobacco Comment   she only smoked when she drank    Goals Met:  Independence with exercise equipment Exercise tolerated well No report of cardiac concerns or symptoms Strength training completed today  Goals Unmet:  Not Applicable  Comments: Pt able to follow exercise prescription today without complaint.  Will continue to monitor for progression.    Katherine Estes is Medical Director for [place] [facility].

## 2020-07-08 DIAGNOSIS — R0600 Dyspnea, unspecified: Secondary | ICD-10-CM

## 2020-07-08 DIAGNOSIS — R0609 Other forms of dyspnea: Secondary | ICD-10-CM

## 2020-07-09 ENCOUNTER — Encounter: Payer: Self-pay | Admitting: *Deleted

## 2020-07-09 DIAGNOSIS — R0600 Dyspnea, unspecified: Secondary | ICD-10-CM

## 2020-07-09 DIAGNOSIS — R0609 Other forms of dyspnea: Secondary | ICD-10-CM

## 2020-07-09 NOTE — Progress Notes (Signed)
Pulmonary Individual Treatment Plan  Patient Details  Name: Katherine Estes MRN: [id number] Date of Birth: [birth year] Referring Provider:   Flowsheet Row Pulmonary Rehab from 06/02/2020 in [facility] Cardiac and Pulmonary Rehab  Referring Provider Hulan Fess MD      Initial Encounter Date:  Flowsheet Row Pulmonary Rehab from 06/02/2020 in [facility] Cardiac and Pulmonary Rehab  Date 06/02/20      Visit Diagnosis: DOE (dyspnea on exertion)  Patient's Home Medications on Admission:  Current Outpatient Medications:  .  albuterol (PROVENTIL) (2.5 MG/3ML) 0.083% nebulizer solution, albuterol sulfate 2.5 mg/3 mL (0.083 %) solution for nebulization, Disp: , Rfl:  .  Albuterol Sulfate (PROAIR HFA IN), Inhale into the lungs as needed., Disp: , Rfl:  .  amLODipine (NORVASC) 5 MG tablet, Take 5 mg by mouth daily., Disp: , Rfl:  .  cholecalciferol (VITAMIN D) 1000 units tablet, Take 1,000 Units by mouth daily., Disp: , Rfl:  .  Fluticasone-Salmeterol (ADVAIR) 500-50 MCG/DOSE AEPB, Inhale 1 puff into the lungs every 12 (twelve) hours.   (Patient not taking: Reported on 05/26/2020), Disp: , Rfl:  .  Fluticasone-Salmeterol (ADVAIR) 500-50 MCG/DOSE AEPB, Advair Diskus 500 mcg-50 mcg/dose powder for inhalation, Disp: , Rfl:  .  hydrochlorothiazide (HYDRODIURIL) 25 MG tablet, Take 25 mg by mouth daily., Disp: , Rfl:  .  HYDROcodone-acetaminophen (VICODIN) 5-325 mg TABS tablet, Take 6 tablets by mouth 4 (four) times daily as needed (Pain). (Patient not taking: No sig reported), Disp: 20 tablet, Rfl: 0 .  ibuprofen (ADVIL,MOTRIN) 800 MG tablet, TAKE 1 TABLET (800 MG TOTAL) BY MOUTH EVERY 8 (EIGHT) HOURS AS NEEDED FOR PAIN., Disp: 30 tablet, Rfl: 1 .  levothyroxine (SYNTHROID) 150 MCG tablet, levothyroxine 150 mcg tablet (Patient not taking: Reported on 05/26/2020), Disp: , Rfl:  .  levothyroxine (SYNTHROID, LEVOTHROID) 175 MCG tablet, Take 175 mcg by mouth daily., Disp: , Rfl:  .  levothyroxine (SYNTHROID,  LEVOTHROID) 200 MCG tablet, Take 200 mcg by mouth daily before breakfast. (Patient not taking: No sig reported), Disp: , Rfl:  .  losartan (COZAAR) 50 MG tablet, Take 50 mg by mouth daily., Disp: , Rfl:  .  meclizine (ANTIVERT) 25 MG tablet, Take 1 tablet (25 mg total) by mouth 3 (three) times daily as needed for dizziness. Take until 24 hours without dizziness (Patient not taking: No sig reported), Disp: 30 tablet, Rfl: 0 .  meloxicam (MOBIC) 7.5 MG tablet, meloxicam 7.5 mg tablet  TAKE 1 TABLET BY MOUTH EVERY DAY FOR 30 DAYS, Disp: , Rfl:  .  metFORMIN (GLUCOPHAGE) 500 MG tablet, Take 500 mg by mouth 2 (two) times daily with a meal., Disp: , Rfl:  .  montelukast (SINGULAIR) 10 MG tablet, Take 10 mg by mouth at bedtime., Disp: , Rfl:  .  naproxen (NAPROSYN) 500 MG tablet, Take 500 mg by mouth 2 (two) times daily with a meal. (Patient not taking: No sig reported), Disp: , Rfl:  .  nystatin-triamcinolone ointment (MYCOLOG), Apply 1 application topically 2 (two) times daily. Apply a small amount to the nodular skin of the areola of both breasts twice a day. (Patient not taking: No sig reported), Disp: 30 g, Rfl: 0 .  Olmesartan-amLODIPine-HCTZ 20-5-12.5 MG TABS, Take 1 tablet by mouth daily. (Patient not taking: No sig reported), Disp: , Rfl:  .  ondansetron (ZOFRAN ODT) 4 MG disintegrating tablet, Take 1 tablet (4 mg total) by mouth every 8 (eight) hours as needed for nausea. (Patient not taking: No sig reported), Disp: 6  tablet, Rfl: 0 .  potassium chloride (KLOR-CON) 10 MEQ tablet, Take 10 mEq by mouth daily., Disp: , Rfl:  .  potassium chloride (MICRO-K) 10 MEQ CR capsule, Take 10 mEq by mouth daily. (Patient not taking: Reported on 05/26/2020), Disp: , Rfl:  .  PROVENTIL HFA 108 (90 Base) MCG/ACT inhaler, Inhale 2 puff as directed every four hours as needed, Disp: , Rfl:  .  sertraline (ZOLOFT) 50 MG tablet, Take 50 mg by mouth daily. (Patient not taking: No sig reported), Disp: , Rfl:   Past Medical  History: Past Medical History:  Diagnosis Date  . Allergy   . Anemia   . Arthritis   . Asthma   . Diabetes mellitus without complication ([place])   . Hypertension   . Hypothyroidism   . Osteoporosis   . Thyroid disease     Tobacco Use: Social History   Tobacco Use  Smoking Status Former Smoker  . Packs/day: 0.25  . Years: 3.00  . Pack years: 0.75  . Types: Cigarettes  . Quit date: 05/21/1991  . Years since quitting: 29.1  Smokeless Tobacco Never Used  Tobacco Comment   she only smoked when she drank    Labs: Recent Review Flowsheet Data   There is no flowsheet data to display.      Pulmonary Assessment Scores:  Pulmonary Assessment Scores    Row Name 06/02/20 1351         ADL UCSD   ADL Phase Entry     SOB Score total 79     Rest 0     Walk 3     Stairs 5     Bath 3     Dress 2     Shop 5           CAT Score   CAT Score 26           mMRC Score   mMRC Score 4            UCSD: Self-administered rating of dyspnea associated with activities of daily living (ADLs) 6-point scale (0 = "not at all" to 5 = "maximal or unable to do because of breathlessness")  Scoring Scores range from 0 to 120.  Minimally important difference is 5 units  CAT: CAT can identify the health impairment of COPD patients and is better correlated with disease progression.  CAT has a scoring range of zero to 40. The CAT score is classified into four groups of low (less than 10), medium (10 - 20), high (21-30) and very high (31-40) based on the impact level of disease on health status. A CAT score over 10 suggests significant symptoms.  A worsening CAT score could be explained by an exacerbation, poor medication adherence, poor inhaler technique, or progression of COPD or comorbid conditions.  CAT MCID is 2 points  mMRC: mMRC (Modified Medical Research Council) Dyspnea Scale is used to assess the degree of baseline functional disability in patients of respiratory disease due to  dyspnea. No minimal important difference is established. A decrease in score of 1 point or greater is considered a positive change.   Pulmonary Function Assessment:  Pulmonary Function Assessment - 05/26/20 1409      Breath   Shortness of Breath Limiting activity;Yes           Exercise Target Goals: Exercise Program Goal: Individual exercise prescription set using results from initial 6 min walk test and THRR while considering  patient's activity barriers and safety.  Exercise Prescription Goal: Initial exercise prescription builds to 30-45 minutes a day of aerobic activity, 2-3 days per week.  Home exercise guidelines will be given to patient during program as part of exercise prescription that the participant will acknowledge.  Education: Aerobic Exercise: - Group verbal and visual presentation on the components of exercise prescription. Introduces F.I.T.T principle from ACSM for exercise prescriptions.  Reviews F.I.T.T. principles of aerobic exercise including progression. Written material given at graduation.   Education: Resistance Exercise: - Group verbal and visual presentation on the components of exercise prescription. Introduces F.I.T.T principle from ACSM for exercise prescriptions  Reviews F.I.T.T. principles of resistance exercise including progression. Written material given at graduation.    Education: Exercise & Equipment Safety: - Individual verbal instruction and demonstration of equipment use and safety with use of the equipment. Flowsheet Row Pulmonary Rehab from 07/02/2020 in [facility] Cardiac and Pulmonary Rehab  Date 05/26/20  Educator [facility]  Instruction Review Code 1- Verbalizes Understanding      Education: Exercise Physiology & General Exercise Guidelines: - Group verbal and written instruction with models to review the exercise physiology of the cardiovascular system and associated critical values. Provides general exercise guidelines with specific guidelines  to those with heart or lung disease.  Flowsheet Row Pulmonary Rehab from 07/02/2020 in [facility] Cardiac and Pulmonary Rehab  Date 07/02/20  Educator [facility]  Instruction Review Code 1- Verbalizes Understanding      Education: Flexibility, Balance, Mind/Body Relaxation: - Group verbal and visual presentation with interactive activity on the components of exercise prescription. Introduces F.I.T.T principle from ACSM for exercise prescriptions. Reviews F.I.T.T. principles of flexibility and balance exercise training including progression. Also discusses the mind body connection.  Reviews various relaxation techniques to help reduce and manage stress (i.e. Deep breathing, progressive muscle relaxation, and visualization). Balance handout provided to take home. Written material given at graduation.   Activity Barriers & Risk Stratification:  Activity Barriers & Cardiac Risk Stratification - 06/02/20 1342      Activity Barriers & Cardiac Risk Stratification   Activity Barriers Arthritis;Deconditioning;Muscular Weakness;Shortness of Breath;Balance Concerns;Joint Problems;Back Problems   chronic knee pain (hurts all the time), spinal deterioration          6 Minute Walk:  6 Minute Walk    Row Name 06/02/20 1340         6 Minute Walk   Phase Initial     Distance 700 feet     Walk Time 4.96 minutes     # of Rest Breaks 4  10 sec, 22 sec, 15 sec, 15 sec     MPH 1.6     METS 1.76     RPE 13     Perceived Dyspnea  1     VO2 Peak 6.17     Symptoms Yes (comment)     Comments SOB, chronic knee pain     Resting HR 76 bpm     Resting BP 126/64     Resting Oxygen Saturation  996 %     Exercise Oxygen Saturation  during 6 min walk 94 %     Max Ex. HR 98 bpm     Max Ex. BP 150/64     2 Minute Post BP 144/66           Interval HR   1 Minute HR 86     2 Minute HR 91     3 Minute HR 89     4 Minute HR 94  5 Minute HR 85     6 Minute HR 98     2 Minute Post HR 75     Interval Heart Rate? Yes            Interval Oxygen   Interval Oxygen? Yes     Baseline Oxygen Saturation % 96 %     1 Minute Oxygen Saturation % 95 %     1 Minute Liters of Oxygen 0 L  Room Air     2 Minute Oxygen Saturation % 94 %     2 Minute Liters of Oxygen 0 L     3 Minute Oxygen Saturation % 94 %     3 Minute Liters of Oxygen 0 L     4 Minute Oxygen Saturation % 95 %     4 Minute Liters of Oxygen 0 L     5 Minute Oxygen Saturation % 94 %     5 Minute Liters of Oxygen 0 L     6 Minute Oxygen Saturation % 94 %     6 Minute Liters of Oxygen 0 L     2 Minute Post Oxygen Saturation % 95 %     2 Minute Post Liters of Oxygen 0 L           Oxygen Initial Assessment:  Oxygen Initial Assessment - 05/26/20 1405      Home Oxygen   Home Oxygen Device None    Sleep Oxygen Prescription None    Home Exercise Oxygen Prescription None    Home Resting Oxygen Prescription None      Initial 6 min Walk   Oxygen Used None      Program Oxygen Prescription   Program Oxygen Prescription None      Intervention   Short Term Goals To learn and demonstrate proper use of respiratory medications;To learn and demonstrate proper pursed lip breathing techniques or other breathing techniques.;To learn and understand importance of maintaining oxygen saturations>88%;To learn and understand importance of monitoring SPO2 with pulse oximeter and demonstrate accurate use of the pulse oximeter.    Long  Term Goals Maintenance of O2 saturations>88%;Verbalizes importance of monitoring SPO2 with pulse oximeter and return demonstration;Exhibits proper breathing techniques, such as pursed lip breathing or other method taught during program session;Compliance with respiratory medication;Demonstrates proper use of MDI's           Oxygen Re-Evaluation:  Oxygen Re-Evaluation    Row Name 06/04/20 1609             Program Oxygen Prescription   Program Oxygen Prescription None               Home Oxygen   Home Oxygen Device None        Sleep Oxygen Prescription None       Home Exercise Oxygen Prescription None       Home Resting Oxygen Prescription None               Goals/Expected Outcomes   Short Term Goals To learn and demonstrate proper pursed lip breathing techniques or other breathing techniques.;To learn and understand importance of maintaining oxygen saturations>88%;To learn and understand importance of monitoring SPO2 with pulse oximeter and demonstrate accurate use of the pulse oximeter.       Long  Term Goals Maintenance of O2 saturations>88%;Verbalizes importance of monitoring SPO2 with pulse oximeter and return demonstration;Exhibits proper breathing techniques, such as pursed lip breathing or other method taught during program session  Comments Reviewed PLB technique with pt.  Talked about how it works and it's importance in maintaining their exercise saturations.       Goals/Expected Outcomes Short: Become more profiecient at using PLB.   Long: Become independent at using PLB.              Oxygen Discharge (Final Oxygen Re-Evaluation):  Oxygen Re-Evaluation - 06/04/20 1609      Program Oxygen Prescription   Program Oxygen Prescription None      Home Oxygen   Home Oxygen Device None    Sleep Oxygen Prescription None    Home Exercise Oxygen Prescription None    Home Resting Oxygen Prescription None      Goals/Expected Outcomes   Short Term Goals To learn and demonstrate proper pursed lip breathing techniques or other breathing techniques.;To learn and understand importance of maintaining oxygen saturations>88%;To learn and understand importance of monitoring SPO2 with pulse oximeter and demonstrate accurate use of the pulse oximeter.    Long  Term Goals Maintenance of O2 saturations>88%;Verbalizes importance of monitoring SPO2 with pulse oximeter and return demonstration;Exhibits proper breathing techniques, such as pursed lip breathing or other method taught during program session     Comments Reviewed PLB technique with pt.  Talked about how it works and it's importance in maintaining their exercise saturations.    Goals/Expected Outcomes Short: Become more profiecient at using PLB.   Long: Become independent at using PLB.           Initial Exercise Prescription:  Initial Exercise Prescription - 06/02/20 1300      Date of Initial Exercise RX and Referring Provider   Date 06/02/20    Referring Provider Hulan Fess MD      Treadmill   MPH 1.4    Grade 0.5    Minutes 15    METs 2.17      Recumbant Bike   Level 1    RPM 50    Watts 8    Minutes 15    METs 1.7      NuStep   Level 1    SPM 80    Minutes 15    METs 1.7      REL-XR   Level 1    Speed 50    Minutes 15    METs 1.7      Prescription Details   Frequency (times per week) 3    Duration Progress to 30 minutes of continuous aerobic without signs/symptoms of physical distress      Intensity   THRR 40-80% of Max Heartrate 107-143    Ratings of Perceived Exertion 11-13    Perceived Dyspnea 0-4      Progression   Progression Continue to progress workloads to maintain intensity without signs/symptoms of physical distress.      Resistance Training   Training Prescription Yes    Weight 3 lb    Reps 10-15           Perform Capillary Blood Glucose checks as needed.  Exercise Prescription Changes:  Exercise Prescription Changes    Row Name 06/02/20 1300 06/10/20 1500 06/25/20 1400         Response to Exercise   Blood Pressure (Admit) 126/64 132/82 124/70     Blood Pressure (Exercise) 150/64 146/80 146/66     Blood Pressure (Exit) 126/66 124/80 122/68     Heart Rate (Admit) 76 bpm 77 bpm 82 bpm     Heart Rate (Exercise) 98 bpm 90 bpm  87 bpm     Heart Rate (Exit) 71 bpm 87 bpm 80 bpm     Oxygen Saturation (Admit) 96 % 95 % 96 %     Oxygen Saturation (Exercise) 94 % 97 % 94 %     Oxygen Saturation (Exit) 96 % 96 % 97 %     Rating of Perceived Exertion (Exercise) 13 13 13       Perceived Dyspnea (Exercise) 1 0 --     Symptoms SOB, chronic knee pain none none     Comments walk test results third full day of exercise --     Duration -- Continue with 30 min of aerobic exercise without signs/symptoms of physical distress. Continue with 30 min of aerobic exercise without signs/symptoms of physical distress.     Intensity -- THRR unchanged THRR unchanged           Progression   Progression -- Continue to progress workloads to maintain intensity without signs/symptoms of physical distress. Continue to progress workloads to maintain intensity without signs/symptoms of physical distress.     Average METs -- 1.8 3.5           Resistance Training   Training Prescription -- Yes Yes     Weight -- 3 lb 3 lb     Reps -- 10-15 10-15           Interval Training   Interval Training -- No No           Recumbant Bike   Level -- 1 --     Minutes -- 15 --           NuStep   Level -- 1 2     SPM -- -- 80     Minutes -- 15 15     METs -- 1.6 3.5           Biostep-RELP   Level -- 1 --     Minutes -- 15 --     METs -- 2 --            Exercise Comments:  Exercise Comments    Row Name 06/04/20 1609           Exercise Comments First full day of exercise!  Patient was oriented to gym and equipment including functions, settings, policies, and procedures.  Patient's individual exercise prescription and treatment plan were reviewed.  All starting workloads were established based on the results of the 6 minute walk test done at initial orientation visit.  The plan for exercise progression was also introduced and progression will be customized based on patient's performance and goals.              Exercise Goals and Review:  Exercise Goals    Row Name 06/02/20 1348             Exercise Goals   Increase Physical Activity Yes       Intervention Provide advice, education, support and counseling about physical activity/exercise needs.;Develop an individualized  exercise prescription for aerobic and resistive training based on initial evaluation findings, risk stratification, comorbidities and participant's personal goals.       Expected Outcomes Long Term: Add in home exercise to make exercise part of routine and to increase amount of physical activity.;Short Term: Attend rehab on a regular basis to increase amount of physical activity.;Long Term: Exercising regularly at least 3-5 days a week.       Increase Strength and Stamina Yes  Intervention Provide advice, education, support and counseling about physical activity/exercise needs.;Develop an individualized exercise prescription for aerobic and resistive training based on initial evaluation findings, risk stratification, comorbidities and participant's personal goals.       Expected Outcomes Short Term: Increase workloads from initial exercise prescription for resistance, speed, and METs.;Short Term: Perform resistance training exercises routinely during rehab and add in resistance training at home;Long Term: Improve cardiorespiratory fitness, muscular endurance and strength as measured by increased METs and functional capacity (6MWT)       Able to understand and use rate of perceived exertion (RPE) scale Yes       Intervention Provide education and explanation on how to use RPE scale       Expected Outcomes Short Term: Able to use RPE daily in rehab to express subjective intensity level;Long Term:  Able to use RPE to guide intensity level when exercising independently       Able to understand and use Dyspnea scale Yes       Intervention Provide education and explanation on how to use Dyspnea scale       Expected Outcomes Short Term: Able to use Dyspnea scale daily in rehab to express subjective sense of shortness of breath during exertion;Long Term: Able to use Dyspnea scale to guide intensity level when exercising independently       Knowledge and understanding of Target Heart Rate Range (THRR) Yes        Intervention Provide education and explanation of THRR including how the numbers were predicted and where they are located for reference       Expected Outcomes Short Term: Able to state/look up THRR;Short Term: Able to use daily as guideline for intensity in rehab;Long Term: Able to use THRR to govern intensity when exercising independently       Able to check pulse independently Yes       Intervention Provide education and demonstration on how to check pulse in carotid and radial arteries.;Review the importance of being able to check your own pulse for safety during independent exercise       Expected Outcomes Short Term: Able to explain why pulse checking is important during independent exercise;Long Term: Able to check pulse independently and accurately       Understanding of Exercise Prescription Yes       Intervention Provide education, explanation, and written materials on patient's individual exercise prescription       Expected Outcomes Short Term: Able to explain program exercise prescription;Long Term: Able to explain home exercise prescription to exercise independently              Exercise Goals Re-Evaluation :  Exercise Goals Re-Evaluation    Row Name 06/04/20 1609 06/10/20 1544 06/25/20 1412         Exercise Goal Re-Evaluation   Exercise Goals Review Increase Physical Activity;Able to understand and use rate of perceived exertion (RPE) scale;Knowledge and understanding of Target Heart Rate Range (THRR);Understanding of Exercise Prescription;Able to check pulse independently;Able to understand and use Dyspnea scale;Increase Strength and Stamina Increase Physical Activity;Increase Strength and Stamina;Understanding of Exercise Prescription Increase Physical Activity;Increase Strength and Stamina     Comments Reviewed RPE and dyspnea scales, THR and program prescription with pt today.  Pt voiced understanding and was given a copy of goals to take home. Katherine Estes is off to a good start  in rehab.  She has completed her first three full days of rehab.  We will start to increase her workloads as  the BioStep was an RPE of 8 on her last visit.  We will continue to monitor her progress. Katherine Estes has increased to level 2 and 3.5 METs on NS.  Oxygen has stayed in mid to upper 90s during exercise.  Staff will monitor progress.     Expected Outcomes Short: Use RPE daily to regulate intensity. Long: Follow program prescription in THR. Short: Increase workloads Long: Continue to follow program prescription Short: increase workloads Long: continue to improve METs            Discharge Exercise Prescription (Final Exercise Prescription Changes):  Exercise Prescription Changes - 06/25/20 1400      Response to Exercise   Blood Pressure (Admit) 124/70    Blood Pressure (Exercise) 146/66    Blood Pressure (Exit) 122/68    Heart Rate (Admit) 82 bpm    Heart Rate (Exercise) 87 bpm    Heart Rate (Exit) 80 bpm    Oxygen Saturation (Admit) 96 %    Oxygen Saturation (Exercise) 94 %    Oxygen Saturation (Exit) 97 %    Rating of Perceived Exertion (Exercise) 13    Symptoms none    Duration Continue with 30 min of aerobic exercise without signs/symptoms of physical distress.    Intensity THRR unchanged      Progression   Progression Continue to progress workloads to maintain intensity without signs/symptoms of physical distress.    Average METs 3.5      Resistance Training   Training Prescription Yes    Weight 3 lb    Reps 10-15      Interval Training   Interval Training No      NuStep   Level 2    SPM 80    Minutes 15    METs 3.5           Nutrition:  Target Goals: Understanding of nutrition guidelines, daily intake of sodium 1500mg , cholesterol 200mg , calories 30% from fat and 7% or less from saturated fats, daily to have 5 or more servings of fruits and vegetables.  Education: All About Nutrition: -Group instruction provided by verbal, written material, interactive  activities, discussions, models, and posters to present general guidelines for heart healthy nutrition including fat, fiber, MyPlate, the role of sodium in heart healthy nutrition, utilization of the nutrition label, and utilization of this knowledge for meal planning. Follow up email sent as well. Written material given at graduation.   Biometrics:  Pre Biometrics - 06/02/20 1348      Pre Biometrics   Height 5' 6.1" (1.679 m)    Weight 258 lb 1.6 oz (117.1 kg)    BMI (Calculated) 41.53    Single Leg Stand 1.2 seconds            Nutrition Therapy Plan and Nutrition Goals:  Nutrition Therapy & Goals - 06/30/20 1612      Nutrition Therapy   Diet Heart healthy, low Na, diabetes friendly eating    Protein (specify units) 90g    Fiber 25 grams    Whole Grain Foods 3 servings    Saturated Fats 12 max. grams    Fruits and Vegetables 8 servings/day    Sodium 1.5 grams      Personal Nutrition Goals   Nutrition Goal ST: snack of fruit add cheese (has low fat string cheese), add greek yogurt to meal of just fruit over the summer, eat meals and snacks consistently LT: eat meals and snacks with protein, fat, and fiber.  Comments Katherine Estes reports stress eating due to her family and friends going through hard times - cancer, tumors; her husband passed away from cancer when he was 26. 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 She has two sons. 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 She gave up sugar for Malena Edman except for her starbuscks coffee. She can sometimes eat distractedly. Her diabetes is undercontrol and has sometimes been lower than needed for exercise at rehab. her bathroom habits are hard, soft, then diarrhea each day - 5 days in between - has been like this since childhood. She has no pain associated, but she can get uncomfortable and nauseous. She will drink  something that helps her go to the bathroom. L: at school D: not past 6pm - weekends are a bit different. She reports it doesn't take much to keep her full. She reports drinking lots of water. During the week L: her mom makes her lunch - oatbread - peanut butter and jelly or tuna. sometimes leftover. Some fruit. D: her mother cooks day - her mother cooks chicken and sometimes pork chops, salmon, or shrimp - limits her red meat.  Her mom uses canola oil - her mom bakes it. She will have protein will salad with flavored rice with greens or peas. Over the summer sometimes she will just have fruit for dinner. She doesn't care much for bread. Discussed heart healthy eating and diabetes friendly eating.      Intervention Plan   Intervention Prescribe, educate and counsel regarding individualized specific dietary modifications aiming towards targeted core components such as weight, hypertension, lipid management, diabetes, heart failure and other comorbidities.;Nutrition handout(s) given to patient.    Expected Outcomes Short Term Goal: Understand basic principles of dietary content, such as calories, fat, sodium, cholesterol and nutrients.;Long Term Goal: Adherence to prescribed nutrition plan.;Short Term Goal: A plan has been developed with personal nutrition goals set during dietitian appointment.           Nutrition Assessments:  MEDIFICTS Score Key:  ?70 Need to make dietary changes   40-70 Heart Healthy Diet  ? 40 Therapeutic Level Cholesterol Diet  Flowsheet Row Pulmonary Rehab from 06/02/2020 in [facility] Cardiac and Pulmonary Rehab  Picture Your Plate Total Score on Admission 76     Picture Your Plate Scores:  <08 Unhealthy dietary pattern with much room for improvement.  41-50 Dietary pattern unlikely to meet recommendations for good health and room for improvement.  51-60 More healthful dietary pattern, with some room for improvement.   >60 Healthy dietary pattern, although there may be  some specific behaviors that could be improved.   Nutrition Goals Re-Evaluation:   Nutrition Goals Discharge (Final Nutrition Goals Re-Evaluation):   Psychosocial: Target Goals: Acknowledge presence or absence of significant depression and/or stress, maximize coping skills, provide positive support system. Participant is able to verbalize types and ability to use techniques and skills needed for reducing stress and depression.   Education: Stress, Anxiety, and Depression - Group verbal and visual presentation to define topics covered.  Reviews how body is impacted by stress, anxiety, and depression.  Also discusses healthy ways to reduce stress and to treat/manage anxiety and depression.  Written material given at graduation.   Education: Sleep Hygiene -Provides group verbal and written instruction about how sleep can affect your health.  Define sleep hygiene, discuss sleep  cycles and impact of sleep habits. Review good sleep hygiene tips.    Initial Review & Psychosocial Screening:  Initial Psych Review & Screening - 05/26/20 1413      Initial Review   Current issues with None Identified;Current Anxiety/Panic;History of Depression      Family Dynamics   Good Support System? Yes    Comments She has a son, aunt and lives with her 47 year old mom. Her two sisters also live in town. She has a good outlook on her health.      Barriers   Psychosocial barriers to participate in program The patient should benefit from training in stress management and relaxation.      Screening Interventions   Interventions Encouraged to exercise;Program counselor consult;To provide support and resources with identified psychosocial needs;Provide feedback about the scores to participant    Expected Outcomes Short Term goal: Utilizing psychosocial counselor, staff and physician to assist with identification of specific Stressors or current issues interfering with healing process. Setting desired goal for  each stressor or current issue identified.;Short Term goal: Identification and review with participant of any Quality of Life or Depression concerns found by scoring the questionnaire.;Long Term Goal: Stressors or current issues are controlled or eliminated.;Long Term goal: The participant improves quality of Life and PHQ9 Scores as seen by post scores and/or verbalization of changes           Quality of Life Scores:  Scores of 19 and below usually indicate a poorer quality of life in these areas.  A difference of  2-3 points is a clinically meaningful difference.  A difference of 2-3 points in the total score of the Quality of Life Index has been associated with significant improvement in overall quality of life, self-image, physical symptoms, and general health in studies assessing change in quality of life.  PHQ-9: Recent Review Flowsheet Data    Depression screen [facility] 2/9 06/02/2020   Decreased Interest 3   Down, Depressed, Hopeless 0   PHQ - 2 Score 3   Altered sleeping 3   Tired, decreased energy 3   Change in appetite 3   Feeling bad or failure about yourself  0   Trouble concentrating 3   Moving slowly or fidgety/restless 0   Suicidal thoughts 0   PHQ-9 Score 15   Difficult doing work/chores Somewhat difficult     Interpretation of Total Score  Total Score Depression Severity:  1-4 = Minimal depression, 5-9 = Mild depression, 10-14 = Moderate depression, 15-19 = Moderately severe depression, 20-27 = Severe depression   Psychosocial Evaluation and Intervention:  Psychosocial Evaluation - 05/26/20 1419      Psychosocial Evaluation & Interventions   Interventions Encouraged to exercise with the program and follow exercise prescription;Relaxation education;Stress management education    Comments She has a son, aunt and lives with her 17 year old mom. Her two sisters also live in town. She has a good outlook on her health.    Expected Outcomes Short: Exercise regularly to  support mental health and notify staff of any changes. Long: maintain mental health and well being through teaching of rehab or prescribed medications independently.    Continue Psychosocial Services  Follow up required by staff           Psychosocial Re-Evaluation:   Psychosocial Discharge (Final Psychosocial Re-Evaluation):   Education: Education Goals: Education classes will be provided on a weekly basis, covering required topics. Participant will state understanding/return demonstration of topics presented.  Learning  Barriers/Preferences:  Learning Barriers/Preferences - 05/26/20 1409      Learning Barriers/Preferences   Learning Barriers None    Learning Preferences None           General Pulmonary Education Topics:  Infection Prevention: - Provides verbal and written material to individual with discussion of infection control including proper hand washing and proper equipment cleaning during exercise session. Flowsheet Row Pulmonary Rehab from 07/02/2020 in [facility] Cardiac and Pulmonary Rehab  Date 05/26/20  Educator [facility]  Instruction Review Code 1- Verbalizes Understanding      Falls Prevention: - Provides verbal and written material to individual with discussion of falls prevention and safety. Flowsheet Row Pulmonary Rehab from 07/02/2020 in [facility] Cardiac and Pulmonary Rehab  Date 05/26/20  Educator [facility]  Instruction Review Code 1- Verbalizes Understanding      Chronic Lung Disease Review: - Group verbal instruction with posters, models, PowerPoint presentations and videos,  to review new updates, new respiratory medications, new advancements in procedures and treatments. Providing information on websites and "800" numbers for continued self-education. Includes information about supplement oxygen, available portable oxygen systems, continuous and intermittent flow rates, oxygen safety, concentrators, and Medicare reimbursement for oxygen. Explanation of Pulmonary Drugs,  including class, frequency, complications, importance of spacers, rinsing mouth after steroid MDI's, and proper cleaning methods for nebulizers. Review of basic lung anatomy and physiology related to function, structure, and complications of lung disease. Review of risk factors. Discussion about methods for diagnosing sleep apnea and types of masks and machines for OSA. Includes a review of the use of types of environmental controls: home humidity, furnaces, filters, dust mite/pet prevention, HEPA vacuums. Discussion about weather changes, air quality and the benefits of nasal washing. Instruction on Warning signs, infection symptoms, calling MD promptly, preventive modes, and value of vaccinations. Review of effective airway clearance, coughing and/or vibration techniques. Emphasizing that all should Create an Action Plan. Written material given at graduation. Flowsheet Row Pulmonary Rehab from 07/02/2020 in [facility] Cardiac and Pulmonary Rehab  Education need identified 06/02/20  Date 06/18/20  Educator [facility]  Instruction Review Code 1- Verbalizes Understanding      AED/CPR: - Group verbal and written instruction with the use of models to demonstrate the basic use of the AED with the basic ABC's of resuscitation.    Anatomy and Cardiac Procedures: - Group verbal and visual presentation and models provide information about basic cardiac anatomy and function. Reviews the testing methods done to diagnose heart disease and the outcomes of the test results. Describes the treatment choices: Medical Management, Angioplasty, or Coronary Bypass Surgery for treating various heart conditions including Myocardial Infarction, Angina, Valve Disease, and Cardiac Arrhythmias.  Written material given at graduation.   Medication Safety: - Group verbal and visual instruction to review commonly prescribed medications for heart and lung disease. Reviews the medication, class of the drug, and side effects. Includes the steps  to properly store meds and maintain the prescription regimen.  Written material given at graduation.   Other: -Provides group and verbal instruction on various topics (see comments)   Knowledge Questionnaire Score:  Knowledge Questionnaire Score - 06/02/20 1349      Knowledge Questionnaire Score   Pre Score 16/18 Education Focus: O2 safety            Core Components/Risk Factors/Patient Goals at Admission:  Personal Goals and Risk Factors at Admission - 06/02/20 1350      Core Components/Risk Factors/Patient Goals on Admission    Weight Management Yes;Weight Loss;Obesity  Intervention Weight Management: Develop a combined nutrition and exercise program designed to reach desired caloric intake, while maintaining appropriate intake of nutrient and fiber, sodium and fats, and appropriate energy expenditure required for the weight goal.;Weight Management: Provide education and appropriate resources to help participant work on and attain dietary goals.;Weight Management/Obesity: Establish reasonable short term and long term weight goals.;Obesity: Provide education and appropriate resources to help participant work on and attain dietary goals.    Admit Weight 258 lb 1.6 oz (117.1 kg)    Goal Weight: Short Term 250 lb (113.4 kg)    Goal Weight: Long Term 240 lb (108.9 kg)    Expected Outcomes Short Term: Continue to assess and modify interventions until short term weight is achieved;Long Term: Adherence to nutrition and physical activity/exercise program aimed toward attainment of established weight goal;Weight Loss: Understanding of general recommendations for a balanced deficit meal plan, which promotes 1-2 lb weight loss per week and includes a negative energy balance of [phone] kcal/d;Understanding recommendations for meals to include 15-35% energy as protein, 25-35% energy from fat, 35-60% energy from carbohydrates, less than 200mg  of dietary cholesterol, 20-35 gm of total fiber  daily;Understanding of distribution of calorie intake throughout the day with the consumption of 4-5 meals/snacks    Improve shortness of breath with ADL's Yes    Intervention Provide education, individualized exercise plan and daily activity instruction to help decrease symptoms of SOB with activities of daily living.    Expected Outcomes Short Term: Improve cardiorespiratory fitness to achieve a reduction of symptoms when performing ADLs;Long Term: Be able to perform more ADLs without symptoms or delay the onset of symptoms    Diabetes Yes    Intervention Provide education about signs/symptoms and action to take for hypo/hyperglycemia.;Provide education about proper nutrition, including hydration, and aerobic/resistive exercise prescription along with prescribed medications to achieve blood glucose in normal ranges: Fasting glucose 65-99 mg/dL    Expected Outcomes Short Term: Participant verbalizes understanding of the signs/symptoms and immediate care of hyper/hypoglycemia, proper foot care and importance of medication, aerobic/resistive exercise and nutrition plan for blood glucose control.;Long Term: Attainment of HbA1C < 7%.    Hypertension Yes    Intervention Provide education on lifestyle modifcations including regular physical activity/exercise, weight management, moderate sodium restriction and increased consumption of fresh fruit, vegetables, and low fat dairy, alcohol moderation, and smoking cessation.;Monitor prescription use compliance.    Expected Outcomes Short Term: Continued assessment and intervention until BP is < 140/2mm HG in hypertensive participants. < 130/18mm HG in hypertensive participants with diabetes, heart failure or chronic kidney disease.;Long Term: Maintenance of blood pressure at goal levels.           Education:Diabetes - Individual verbal and written instruction to review signs/symptoms of diabetes, desired ranges of glucose level fasting, after meals and with  exercise. Acknowledge that pre and post exercise glucose checks will be done for 3 sessions at entry of program. Flowsheet Row Pulmonary Rehab from 07/02/2020 in [facility] Cardiac and Pulmonary Rehab  Date 05/26/20  Educator [facility]  Instruction Review Code 1- Verbalizes Understanding      Know Your Numbers and Heart Failure: - Group verbal and visual instruction to discuss disease risk factors for cardiac and pulmonary disease and treatment options.  Reviews associated critical values for Overweight/Obesity, Hypertension, Cholesterol, and Diabetes.  Discusses basics of heart failure: signs/symptoms and treatments.  Introduces Heart Failure Zone chart for action plan for heart failure.  Written material given at graduation. Flowsheet Row Pulmonary Rehab from  07/02/2020 in [facility] Cardiac and Pulmonary Rehab  Date 06/11/20  Educator [facility]  Instruction Review Code 1- Verbalizes Understanding      Core Components/Risk Factors/Patient Goals Review:    Core Components/Risk Factors/Patient Goals at Discharge (Final Review):    ITP Comments:  ITP Comments    Row Name 05/26/20 1405 06/02/20 1340 06/04/20 1609 06/11/20 0719 07/08/20 0805   ITP Comments Virtual Visit completed. Patient informed on EP and RD appointment and 6 Minute walk test. Patient also informed of patient health questionnaires on My Chart. Patient Verbalizes understanding. Visit diagnosis can be found in [facility] 05/14/2020. Completed 6MWT and gym orientation. Initial ITP created and sent for review to Dr. Emily Filbert, Medical Director. First full day of exercise!  Patient was oriented to gym and equipment including functions, settings, policies, and procedures.  Patient's individual exercise prescription and treatment plan were reviewed.  All starting workloads were established based on the results of the 6 minute walk test done at initial orientation visit.  The plan for exercise progression was also introduced and progression will be customized based  on patient's performance and goals. 30 Day review completed. Medical Director ITP review done, changes made as directed, and signed approval by Medical Director. Unable to complete goals this cycle - last couple of sessions patient came to rehab late and did not attend 4/18. We will discuss goals when next review cycle begins.   [place] Name 07/09/20 0934           ITP Comments 30 Day review completed. Medical Director ITP review done, changes made as directed, and signed approval by Medical Director.              Comments:

## 2020-07-21 ENCOUNTER — Encounter: Payer: Medicaid Other | Attending: Internal Medicine

## 2020-07-21 ENCOUNTER — Telehealth: Payer: Self-pay

## 2020-07-21 DIAGNOSIS — R0609 Other forms of dyspnea: Secondary | ICD-10-CM

## 2020-07-21 DIAGNOSIS — R0600 Dyspnea, unspecified: Secondary | ICD-10-CM | POA: Insufficient documentation

## 2020-07-21 NOTE — Telephone Encounter (Signed)
Called patient to check in as last time she was here was 07/03/20. LM.

## 2020-07-21 NOTE — Progress Notes (Signed)
Called patient to check in as last time she was here was 07/03/20. LM. 

## 2020-07-23 ENCOUNTER — Other Ambulatory Visit: Payer: Self-pay

## 2020-07-23 DIAGNOSIS — R0600 Dyspnea, unspecified: Secondary | ICD-10-CM

## 2020-07-23 DIAGNOSIS — R0609 Other forms of dyspnea: Secondary | ICD-10-CM

## 2020-07-23 NOTE — Progress Notes (Signed)
Daily Session Note  Patient Details  Name: Katherine Estes MRN: [id number] Date of Birth: 10/26/1960 Referring Provider:   Flowsheet Row Pulmonary Rehab from 06/02/2020 in [facility] Cardiac and Pulmonary Rehab  Referring Provider Hulan Fess MD      Encounter Date: 07/23/2020  Check In:  Session Check In - 07/23/20 1623      Check-In   Supervising physician immediately available to respond to emergencies See telemetry face sheet for immediately available ER MD    Location [facility]-Cardiac & Pulmonary Rehab    Staff Present Birdie Sons, MPA, RN;Joseph Lou Miner, MS Exercise Physiologist    Virtual Visit No    Medication changes reported     No    Fall or balance concerns reported    No    Warm-up and Cool-down Performed on first and last piece of equipment    Resistance Training Performed Yes    VAD Patient? No    PAD/SET Patient? No      Pain Assessment   Currently in Pain? No/denies              Social History   Tobacco Use  Smoking Status Former Smoker  . Packs/day: 0.25  . Years: 3.00  . Pack years: 0.75  . Types: Cigarettes  . Quit date: 05/21/1991  . Years since quitting: 29.1  Smokeless Tobacco Never Used  Tobacco Comment   she only smoked when she drank    Goals Met:  Independence with exercise equipment Exercise tolerated well No report of cardiac concerns or symptoms Strength training completed today  Goals Unmet:  Not Applicable  Comments: Pt able to follow exercise prescription today without complaint.  Will continue to monitor for progression.    Dr. Emily Filbert is Medical Director for [facility] and [facility].

## 2020-07-24 DIAGNOSIS — R0600 Dyspnea, unspecified: Secondary | ICD-10-CM | POA: Diagnosis present

## 2020-07-30 ENCOUNTER — Other Ambulatory Visit: Payer: Self-pay

## 2020-07-30 DIAGNOSIS — R0609 Other forms of dyspnea: Secondary | ICD-10-CM

## 2020-07-30 DIAGNOSIS — R0600 Dyspnea, unspecified: Secondary | ICD-10-CM

## 2020-07-30 NOTE — Progress Notes (Signed)
Incomplete Session Note  Patient Details  Name: Katherine Estes MRN: [id number] Date of Birth: 1961/01/23 Referring Provider:   Flowsheet Row Pulmonary Rehab from 06/02/2020 in [facility] Cardiac and Pulmonary Rehab  Referring Provider Hulan Fess MD      [name] did not complete her rehab session. She stayed for rehab staff to review her goals and provide education. Unable to exercise due to not wearing approved footwear for exercise equipment.

## 2020-07-31 ENCOUNTER — Encounter: Payer: Medicaid Other | Admitting: *Deleted

## 2020-07-31 DIAGNOSIS — R0600 Dyspnea, unspecified: Secondary | ICD-10-CM

## 2020-07-31 DIAGNOSIS — R0609 Other forms of dyspnea: Secondary | ICD-10-CM

## 2020-07-31 NOTE — Progress Notes (Signed)
Daily Session Note  Patient Details  Name: Katherine Estes MRN: [id number] Date of Birth: Apr 18, 1960 Referring Provider:   Flowsheet Row Pulmonary Rehab from 06/02/2020 in [facility] Cardiac and Pulmonary Rehab  Referring Provider Hulan Fess MD      Encounter Date: 07/31/2020  Check In:  Session Check In - 07/31/20 1652      Check-In   Supervising physician immediately available to respond to emergencies See telemetry face sheet for immediately available ER MD    Location [facility]-Cardiac & Pulmonary Rehab    Staff Present Heath Lark, RN, BSN, CCRP;Joseph Foy [place], [place], ACSM CEP, Exercise Physiologist    Virtual Visit No    Medication changes reported     No    Fall or balance concerns reported    No    Resistance Training Performed Yes    VAD Patient? No    PAD/SET Patient? No      Pain Assessment   Currently in Pain? No/denies              Social History   Tobacco Use  Smoking Status Former Smoker  . Packs/day: 0.25  . Years: 3.00  . Pack years: 0.75  . Types: Cigarettes  . Quit date: 05/21/1991  . Years since quitting: 29.2  Smokeless Tobacco Never Used  Tobacco Comment   she only smoked when she drank    Goals Met:  Proper associated with RPD/PD & O2 Sat Independence with exercise equipment Exercise tolerated well No report of cardiac concerns or symptoms  Goals Unmet:  Not Applicable  Comments: Pt able to follow exercise prescription today without complaint.  Will continue to monitor for progression.    Dr. Emily Filbert is Medical Director for [facility] and [facility].

## 2020-08-06 ENCOUNTER — Encounter: Payer: Self-pay | Admitting: *Deleted

## 2020-08-06 DIAGNOSIS — R0600 Dyspnea, unspecified: Secondary | ICD-10-CM

## 2020-08-06 DIAGNOSIS — R0609 Other forms of dyspnea: Secondary | ICD-10-CM

## 2020-08-06 NOTE — Progress Notes (Signed)
Pulmonary Individual Treatment Plan  Patient Details  Name: Katherine Estes MRN: [id number] Date of Birth: 07-03-1960 Referring Provider:   Flowsheet Row Pulmonary Rehab from 06/02/2020 in [facility] Cardiac and Pulmonary Rehab  Referring Provider Hulan Fess MD      Initial Encounter Date:  Flowsheet Row Pulmonary Rehab from 06/02/2020 in [facility] Cardiac and Pulmonary Rehab  Date 06/02/20      Visit Diagnosis: DOE (dyspnea on exertion)  Patient's Home Medications on Admission:  Current Outpatient Medications:  .  albuterol (PROVENTIL) (2.5 MG/3ML) 0.083% nebulizer solution, albuterol sulfate 2.5 mg/3 mL (0.083 %) solution for nebulization, Disp: , Rfl:  .  Albuterol Sulfate (PROAIR HFA IN), Inhale into the lungs as needed., Disp: , Rfl:  .  amLODipine (NORVASC) 5 MG tablet, Take 5 mg by mouth daily., Disp: , Rfl:  .  cholecalciferol (VITAMIN D) 1000 units tablet, Take 1,000 Units by mouth daily., Disp: , Rfl:  .  Fluticasone-Salmeterol (ADVAIR) 500-50 MCG/DOSE AEPB, Inhale 1 puff into the lungs every 12 (twelve) hours.   (Patient not taking: Reported on 05/26/2020), Disp: , Rfl:  .  Fluticasone-Salmeterol (ADVAIR) 500-50 MCG/DOSE AEPB, Advair Diskus 500 mcg-50 mcg/dose powder for inhalation, Disp: , Rfl:  .  hydrochlorothiazide (HYDRODIURIL) 25 MG tablet, Take 25 mg by mouth daily., Disp: , Rfl:  .  HYDROcodone-acetaminophen (VICODIN) 5-325 mg TABS tablet, Take 6 tablets by mouth 4 (four) times daily as needed (Pain). (Patient not taking: No sig reported), Disp: 20 tablet, Rfl: 0 .  ibuprofen (ADVIL,MOTRIN) 800 MG tablet, TAKE 1 TABLET (800 MG TOTAL) BY MOUTH EVERY 8 (EIGHT) HOURS AS NEEDED FOR PAIN., Disp: 30 tablet, Rfl: 1 .  levothyroxine (SYNTHROID) 150 MCG tablet, levothyroxine 150 mcg tablet (Patient not taking: Reported on 05/26/2020), Disp: , Rfl:  .  levothyroxine (SYNTHROID, LEVOTHROID) 175 MCG tablet, Take 175 mcg by mouth daily., Disp: , Rfl:  .  levothyroxine (SYNTHROID,  LEVOTHROID) 200 MCG tablet, Take 200 mcg by mouth daily before breakfast. (Patient not taking: No sig reported), Disp: , Rfl:  .  losartan (COZAAR) 50 MG tablet, Take 50 mg by mouth daily., Disp: , Rfl:  .  meclizine (ANTIVERT) 25 MG tablet, Take 1 tablet (25 mg total) by mouth 3 (three) times daily as needed for dizziness. Take until 24 hours without dizziness (Patient not taking: No sig reported), Disp: 30 tablet, Rfl: 0 .  meloxicam (MOBIC) 7.5 MG tablet, meloxicam 7.5 mg tablet  TAKE 1 TABLET BY MOUTH EVERY DAY FOR 30 DAYS, Disp: , Rfl:  .  metFORMIN (GLUCOPHAGE) 500 MG tablet, Take 500 mg by mouth 2 (two) times daily with a meal., Disp: , Rfl:  .  montelukast (SINGULAIR) 10 MG tablet, Take 10 mg by mouth at bedtime., Disp: , Rfl:  .  naproxen (NAPROSYN) 500 MG tablet, Take 500 mg by mouth 2 (two) times daily with a meal. (Patient not taking: No sig reported), Disp: , Rfl:  .  nystatin-triamcinolone ointment (MYCOLOG), Apply 1 application topically 2 (two) times daily. Apply a small amount to the nodular skin of the areola of both breasts twice a day. (Patient not taking: No sig reported), Disp: 30 g, Rfl: 0 .  Olmesartan-amLODIPine-HCTZ 20-5-12.5 MG TABS, Take 1 tablet by mouth daily. (Patient not taking: No sig reported), Disp: , Rfl:  .  ondansetron (ZOFRAN ODT) 4 MG disintegrating tablet, Take 1 tablet (4 mg total) by mouth every 8 (eight) hours as needed for nausea. (Patient not taking: No sig reported), Disp: 6  tablet, Rfl: 0 .  potassium chloride (KLOR-CON) 10 MEQ tablet, Take 10 mEq by mouth daily., Disp: , Rfl:  .  potassium chloride (MICRO-K) 10 MEQ CR capsule, Take 10 mEq by mouth daily. (Patient not taking: Reported on 05/26/2020), Disp: , Rfl:  .  PROVENTIL HFA 108 (90 Base) MCG/ACT inhaler, Inhale 2 puff as directed every four hours as needed, Disp: , Rfl:  .  sertraline (ZOLOFT) 50 MG tablet, Take 50 mg by mouth daily. (Patient not taking: No sig reported), Disp: , Rfl:   Past Medical  History: Past Medical History:  Diagnosis Date  . Allergy   . Anemia   . Arthritis   . Asthma   . Diabetes mellitus without complication ([place])   . Hypertension   . Hypothyroidism   . Osteoporosis   . Thyroid disease     Tobacco Use: Social History   Tobacco Use  Smoking Status Former Smoker  . Packs/day: 0.25  . Years: 3.00  . Pack years: 0.75  . Types: Cigarettes  . Quit date: 05/21/1991  . Years since quitting: 29.2  Smokeless Tobacco Never Used  Tobacco Comment   she only smoked when she drank    Labs: Recent Review Flowsheet Data   There is no flowsheet data to display.      Pulmonary Assessment Scores:  Pulmonary Assessment Scores    Row Name 06/02/20 1351         ADL UCSD   ADL Phase Entry     SOB Score total 79     Rest 0     Walk 3     Stairs 5     Bath 3     Dress 2     Shop 5           CAT Score   CAT Score 26           mMRC Score   mMRC Score 4            UCSD: Self-administered rating of dyspnea associated with activities of daily living (ADLs) 6-point scale (0 = "not at all" to 5 = "maximal or unable to do because of breathlessness")  Scoring Scores range from 0 to 120.  Minimally important difference is 5 units  CAT: CAT can identify the health impairment of COPD patients and is better correlated with disease progression.  CAT has a scoring range of zero to 40. The CAT score is classified into four groups of low (less than 10), medium (10 - 20), high (21-30) and very high (31-40) based on the impact level of disease on health status. A CAT score over 10 suggests significant symptoms.  A worsening CAT score could be explained by an exacerbation, poor medication adherence, poor inhaler technique, or progression of COPD or comorbid conditions.  CAT MCID is 2 points  mMRC: mMRC (Modified Medical Research Council) Dyspnea Scale is used to assess the degree of baseline functional disability in patients of respiratory disease due to  dyspnea. No minimal important difference is established. A decrease in score of 1 point or greater is considered a positive change.   Pulmonary Function Assessment:  Pulmonary Function Assessment - 05/26/20 1409      Breath   Shortness of Breath Limiting activity;Yes           Exercise Target Goals: Exercise Program Goal: Individual exercise prescription set using results from initial 6 min walk test and THRR while considering  patient's activity barriers and safety.  Exercise Prescription Goal: Initial exercise prescription builds to 30-45 minutes a day of aerobic activity, 2-3 days per week.  Home exercise guidelines will be given to patient during program as part of exercise prescription that the participant will acknowledge.  Education: Aerobic Exercise: - Group verbal and visual presentation on the components of exercise prescription. Introduces F.I.T.T principle from ACSM for exercise prescriptions.  Reviews F.I.T.T. principles of aerobic exercise including progression. Written material given at graduation.   Education: Resistance Exercise: - Group verbal and visual presentation on the components of exercise prescription. Introduces F.I.T.T principle from ACSM for exercise prescriptions  Reviews F.I.T.T. principles of resistance exercise including progression. Written material given at graduation.    Education: Exercise & Equipment Safety: - Individual verbal instruction and demonstration of equipment use and safety with use of the equipment. Flowsheet Row Pulmonary Rehab from 07/30/2020 in [facility] Cardiac and Pulmonary Rehab  Date 05/26/20  Educator [facility]  Instruction Review Code 1- Verbalizes Understanding      Education: Exercise Physiology & General Exercise Guidelines: - Group verbal and written instruction with models to review the exercise physiology of the cardiovascular system and associated critical values. Provides general exercise guidelines with specific guidelines  to those with heart or lung disease.  Flowsheet Row Pulmonary Rehab from 07/30/2020 in [facility] Cardiac and Pulmonary Rehab  Date 07/02/20  Educator AS  Instruction Review Code 1- Verbalizes Understanding      Education: Flexibility, Balance, Mind/Body Relaxation: - Group verbal and visual presentation with interactive activity on the components of exercise prescription. Introduces F.I.T.T principle from ACSM for exercise prescriptions. Reviews F.I.T.T. principles of flexibility and balance exercise training including progression. Also discusses the mind body connection.  Reviews various relaxation techniques to help reduce and manage stress (i.e. Deep breathing, progressive muscle relaxation, and visualization). Balance handout provided to take home. Written material given at graduation. Flowsheet Row Pulmonary Rehab from 07/30/2020 in [facility] Cardiac and Pulmonary Rehab  Date 07/23/20  Educator AS  Instruction Review Code 1- Verbalizes Understanding      Activity Barriers & Risk Stratification:  Activity Barriers & Cardiac Risk Stratification - 06/02/20 1342      Activity Barriers & Cardiac Risk Stratification   Activity Barriers Arthritis;Deconditioning;Muscular Weakness;Shortness of Breath;Balance Concerns;Joint Problems;Back Problems   chronic knee pain (hurts all the time), spinal deterioration          6 Minute Walk:  6 Minute Walk    Row Name 06/02/20 1340         6 Minute Walk   Phase Initial     Distance 700 feet     Walk Time 4.96 minutes     # of Rest Breaks 4  10 sec, 22 sec, 15 sec, 15 sec     MPH 1.6     METS 1.76     RPE 13     Perceived Dyspnea  1     VO2 Peak 6.17     Symptoms Yes (comment)     Comments SOB, chronic knee pain     Resting HR 76 bpm     Resting BP 126/64     Resting Oxygen Saturation  996 %     Exercise Oxygen Saturation  during 6 min walk 94 %     Max Ex. HR 98 bpm     Max Ex. BP 150/64     2 Minute Post BP 144/66           Interval HR    1 Minute  HR 86     2 Minute HR 91     3 Minute HR 89     4 Minute HR 94     5 Minute HR 85     6 Minute HR 98     2 Minute Post HR 75     Interval Heart Rate? Yes           Interval Oxygen   Interval Oxygen? Yes     Baseline Oxygen Saturation % 96 %     1 Minute Oxygen Saturation % 95 %     1 Minute Liters of Oxygen 0 L  Room Air     2 Minute Oxygen Saturation % 94 %     2 Minute Liters of Oxygen 0 L     3 Minute Oxygen Saturation % 94 %     3 Minute Liters of Oxygen 0 L     4 Minute Oxygen Saturation % 95 %     4 Minute Liters of Oxygen 0 L     5 Minute Oxygen Saturation % 94 %     5 Minute Liters of Oxygen 0 L     6 Minute Oxygen Saturation % 94 %     6 Minute Liters of Oxygen 0 L     2 Minute Post Oxygen Saturation % 95 %     2 Minute Post Liters of Oxygen 0 L           Oxygen Initial Assessment:  Oxygen Initial Assessment - 05/26/20 1405      Home Oxygen   Home Oxygen Device None    Sleep Oxygen Prescription None    Home Exercise Oxygen Prescription None    Home Resting Oxygen Prescription None      Initial 6 min Walk   Oxygen Used None      Program Oxygen Prescription   Program Oxygen Prescription None      Intervention   Short Term Goals To learn and demonstrate proper use of respiratory medications;To learn and demonstrate proper pursed lip breathing techniques or other breathing techniques.;To learn and understand importance of maintaining oxygen saturations>88%;To learn and understand importance of monitoring SPO2 with pulse oximeter and demonstrate accurate use of the pulse oximeter.    Long  Term Goals Maintenance of O2 saturations>88%;Verbalizes importance of monitoring SPO2 with pulse oximeter and return demonstration;Exhibits proper breathing techniques, such as pursed lip breathing or other method taught during program session;Compliance with respiratory medication;Demonstrates proper use of MDI's           Oxygen Re-Evaluation:  Oxygen  Re-Evaluation    Row Name 06/04/20 1609 07/30/20 1645           Program Oxygen Prescription   Program Oxygen Prescription None None             Home Oxygen   Home Oxygen Device None None      Sleep Oxygen Prescription None None      Home Exercise Oxygen Prescription None None      Home Resting Oxygen Prescription None None             Goals/Expected Outcomes   Short Term Goals To learn and demonstrate proper pursed lip breathing techniques or other breathing techniques.;To learn and understand importance of maintaining oxygen saturations>88%;To learn and understand importance of monitoring SPO2 with pulse oximeter and demonstrate accurate use of the pulse oximeter. To learn and demonstrate proper pursed lip breathing techniques or other breathing techniques.;To learn  and understand importance of maintaining oxygen saturations>88%;To learn and understand importance of monitoring SPO2 with pulse oximeter and demonstrate accurate use of the pulse oximeter.      Long  Term Goals Maintenance of O2 saturations>88%;Verbalizes importance of monitoring SPO2 with pulse oximeter and return demonstration;Exhibits proper breathing techniques, such as pursed lip breathing or other method taught during program session Maintenance of O2 saturations>88%;Verbalizes importance of monitoring SPO2 with pulse oximeter and return demonstration;Exhibits proper breathing techniques, such as pursed lip breathing or other method taught during program session      Comments Reviewed PLB technique with pt.  Talked about how it works and it's importance in maintaining their exercise saturations. Reviewed PLB technique with pt.  Talked about how it works and it's importance in maintaining their exercise saturations.      Goals/Expected Outcomes Short: Become more profiecient at using PLB.   Long: Become independent at using PLB. Short: Become more profiecient at using PLB.   Long: Become independent at using PLB.              Oxygen Discharge (Final Oxygen Re-Evaluation):  Oxygen Re-Evaluation - 07/30/20 1645      Program Oxygen Prescription   Program Oxygen Prescription None      Home Oxygen   Home Oxygen Device None    Sleep Oxygen Prescription None    Home Exercise Oxygen Prescription None    Home Resting Oxygen Prescription None      Goals/Expected Outcomes   Short Term Goals To learn and demonstrate proper pursed lip breathing techniques or other breathing techniques.;To learn and understand importance of maintaining oxygen saturations>88%;To learn and understand importance of monitoring SPO2 with pulse oximeter and demonstrate accurate use of the pulse oximeter.    Long  Term Goals Maintenance of O2 saturations>88%;Verbalizes importance of monitoring SPO2 with pulse oximeter and return demonstration;Exhibits proper breathing techniques, such as pursed lip breathing or other method taught during program session    Comments Reviewed PLB technique with pt.  Talked about how it works and it's importance in maintaining their exercise saturations.    Goals/Expected Outcomes Short: Become more profiecient at using PLB.   Long: Become independent at using PLB.           Initial Exercise Prescription:  Initial Exercise Prescription - 06/02/20 1300      Date of Initial Exercise RX and Referring Provider   Date 06/02/20    Referring Provider Sammuel Cooper MD      Treadmill   MPH 1.4    Grade 0.5    Minutes 15    METs 2.17      Recumbant Bike   Level 1    RPM 50    Watts 8    Minutes 15    METs 1.7      NuStep   Level 1    SPM 80    Minutes 15    METs 1.7      REL-XR   Level 1    Speed 50    Minutes 15    METs 1.7      Prescription Details   Frequency (times per week) 3    Duration Progress to 30 minutes of continuous aerobic without signs/symptoms of physical distress      Intensity   THRR 40-80% of Max Heartrate 107-143    Ratings of Perceived Exertion 11-13    Perceived  Dyspnea 0-4      Progression   Progression Continue to progress workloads  to maintain intensity without signs/symptoms of physical distress.      Resistance Training   Training Prescription Yes    Weight 3 lb    Reps 10-15           Perform Capillary Blood Glucose checks as needed.  Exercise Prescription Changes:  Exercise Prescription Changes    Row Name 06/02/20 1300 06/10/20 1500 06/25/20 1400 07/10/20 1100       Response to Exercise   Blood Pressure (Admit) 126/64 132/82 124/70 120/68    Blood Pressure (Exercise) 150/64 146/80 146/66 124/64    Blood Pressure (Exit) 126/66 124/80 122/68 118/62    Heart Rate (Admit) 76 bpm 77 bpm 82 bpm 78 bpm    Heart Rate (Exercise) 98 bpm 90 bpm 87 bpm 88 bpm    Heart Rate (Exit) 71 bpm 87 bpm 80 bpm 79 bpm    Oxygen Saturation (Admit) 96 % 95 % 96 % 91 %    Oxygen Saturation (Exercise) 94 % 97 % 94 % 90 %    Oxygen Saturation (Exit) 96 % 96 % 97 % 92 %    Rating of Perceived Exertion (Exercise) 13 13 13 14     Perceived Dyspnea (Exercise) 1 0 -- 1    Symptoms SOB, chronic knee pain none none none    Comments walk test results third full day of exercise -- --    Duration -- Continue with 30 min of aerobic exercise without signs/symptoms of physical distress. Continue with 30 min of aerobic exercise without signs/symptoms of physical distress. Continue with 30 min of aerobic exercise without signs/symptoms of physical distress.    Intensity -- THRR unchanged THRR unchanged THRR unchanged         Progression   Progression -- Continue to progress workloads to maintain intensity without signs/symptoms of physical distress. Continue to progress workloads to maintain intensity without signs/symptoms of physical distress. Continue to progress workloads to maintain intensity without signs/symptoms of physical distress.    Average METs -- 1.8 3.5 2.25         Resistance Training   Training Prescription -- Yes Yes Yes    Weight -- 3 lb 3 lb 4  lb    Reps -- 10-15 10-15 10-15         Interval Training   Interval Training -- No No No         Recumbant Bike   Level -- 1 -- --    Minutes -- 15 -- --         NuStep   Level -- 1 2 2     SPM -- -- 80 80    Minutes -- 15 15 15     METs -- 1.6 3.5 2.5         Biostep-RELP   Level -- 1 -- 2    Minutes -- 15 -- 15    METs -- 2 -- 2           Exercise Comments:  Exercise Comments    Row Name 06/04/20 1609           Exercise Comments First full day of exercise!  Patient was oriented to gym and equipment including functions, settings, policies, and procedures.  Patient's individual exercise prescription and treatment plan were reviewed.  All starting workloads were established based on the results of the 6 minute walk test done at initial orientation visit.  The plan for exercise progression was also introduced and progression will be customized based  on patient's performance and goals.              Exercise Goals and Review:  Exercise Goals    Row Name 06/02/20 1348             Exercise Goals   Increase Physical Activity Yes       Intervention Provide advice, education, support and counseling about physical activity/exercise needs.;Develop an individualized exercise prescription for aerobic and resistive training based on initial evaluation findings, risk stratification, comorbidities and participant's personal goals.       Expected Outcomes Long Term: Add in home exercise to make exercise part of routine and to increase amount of physical activity.;Short Term: Attend rehab on a regular basis to increase amount of physical activity.;Long Term: Exercising regularly at least 3-5 days a week.       Increase Strength and Stamina Yes       Intervention Provide advice, education, support and counseling about physical activity/exercise needs.;Develop an individualized exercise prescription for aerobic and resistive training based on initial evaluation findings, risk  stratification, comorbidities and participant's personal goals.       Expected Outcomes Short Term: Increase workloads from initial exercise prescription for resistance, speed, and METs.;Short Term: Perform resistance training exercises routinely during rehab and add in resistance training at home;Long Term: Improve cardiorespiratory fitness, muscular endurance and strength as measured by increased METs and functional capacity (6MWT)       Able to understand and use rate of perceived exertion (RPE) scale Yes       Intervention Provide education and explanation on how to use RPE scale       Expected Outcomes Short Term: Able to use RPE daily in rehab to express subjective intensity level;Long Term:  Able to use RPE to guide intensity level when exercising independently       Able to understand and use Dyspnea scale Yes       Intervention Provide education and explanation on how to use Dyspnea scale       Expected Outcomes Short Term: Able to use Dyspnea scale daily in rehab to express subjective sense of shortness of breath during exertion;Long Term: Able to use Dyspnea scale to guide intensity level when exercising independently       Knowledge and understanding of Target Heart Rate Range (THRR) Yes       Intervention Provide education and explanation of THRR including how the numbers were predicted and where they are located for reference       Expected Outcomes Short Term: Able to state/look up THRR;Short Term: Able to use daily as guideline for intensity in rehab;Long Term: Able to use THRR to govern intensity when exercising independently       Able to check pulse independently Yes       Intervention Provide education and demonstration on how to check pulse in carotid and radial arteries.;Review the importance of being able to check your own pulse for safety during independent exercise       Expected Outcomes Short Term: Able to explain why pulse checking is important during independent  exercise;Long Term: Able to check pulse independently and accurately       Understanding of Exercise Prescription Yes       Intervention Provide education, explanation, and written materials on patient's individual exercise prescription       Expected Outcomes Short Term: Able to explain program exercise prescription;Long Term: Able to explain home exercise prescription to exercise independently  Exercise Goals Re-Evaluation :  Exercise Goals Re-Evaluation    Row Name 06/04/20 1609 06/10/20 1544 06/25/20 1412 07/10/20 1112 07/30/20 1622     Exercise Goal Re-Evaluation   Exercise Goals Review Increase Physical Activity;Able to understand and use rate of perceived exertion (RPE) scale;Knowledge and understanding of Target Heart Rate Range (THRR);Understanding of Exercise Prescription;Able to check pulse independently;Able to understand and use Dyspnea scale;Increase Strength and Stamina Increase Physical Activity;Increase Strength and Stamina;Understanding of Exercise Prescription Increase Physical Activity;Increase Strength and Stamina Increase Physical Activity;Increase Strength and Stamina Increase Physical Activity;Increase Strength and Stamina   Comments Reviewed RPE and dyspnea scales, THR and program prescription with pt today.  Pt voiced understanding and was given a copy of goals to take home. [name] is off to a good start in rehab.  She has completed her first three full days of rehab.  We will start to increase her workloads as the BioStep was an RPE of 8 on her last visit.  We will continue to monitor her progress. [name] has increased to level 2 and 3.5 METs on NS.  Oxygen has stayed in mid to upper 90s during exercise.  Staff will monitor progress. [name] has increased to 4 lb for strength work.  She does not reach THR range.  Staff will encourage her to work towards that and be consistent with exercise in class and at home [name] is not yet doing exercise outside program sessions.   She feels like that is enough for now.  Staff will monitor progress.   Expected Outcomes Short: Use RPE daily to regulate intensity. Long: Follow program prescription in THR. Short: Increase workloads Long: Continue to follow program prescription Short: increase workloads Long: continue to improve METs Short: reach THR range Long: build overall stamina Short: get back to consistent attendance Long:  improve overall stamina          Discharge Exercise Prescription (Final Exercise Prescription Changes):  Exercise Prescription Changes - 07/10/20 1100      Response to Exercise   Blood Pressure (Admit) 120/68    Blood Pressure (Exercise) 124/64    Blood Pressure (Exit) 118/62    Heart Rate (Admit) 78 bpm    Heart Rate (Exercise) 88 bpm    Heart Rate (Exit) 79 bpm    Oxygen Saturation (Admit) 91 %    Oxygen Saturation (Exercise) 90 %    Oxygen Saturation (Exit) 92 %    Rating of Perceived Exertion (Exercise) 14    Perceived Dyspnea (Exercise) 1    Symptoms none    Duration Continue with 30 min of aerobic exercise without signs/symptoms of physical distress.    Intensity THRR unchanged      Progression   Progression Continue to progress workloads to maintain intensity without signs/symptoms of physical distress.    Average METs 2.25      Resistance Training   Training Prescription Yes    Weight 4 lb    Reps 10-15      Interval Training   Interval Training No      NuStep   Level 2    SPM 80    Minutes 15    METs 2.5      Biostep-RELP   Level 2    Minutes 15    METs 2           Nutrition:  Target Goals: Understanding of nutrition guidelines, daily intake of sodium 1500mg , cholesterol 200mg , calories 30% from fat and 7% or less from saturated fats,  daily to have 5 or more servings of fruits and vegetables.  Education: All About Nutrition: -Group instruction provided by verbal, written material, interactive activities, discussions, models, and posters to present  general guidelines for heart healthy nutrition including fat, fiber, MyPlate, the role of sodium in heart healthy nutrition, utilization of the nutrition label, and utilization of this knowledge for meal planning. Follow up email sent as well. Written material given at graduation. Flowsheet Row Pulmonary Rehab from 07/30/2020 in [facility] Cardiac and Pulmonary Rehab  Date 07/30/20  Educator [facility]  Instruction Review Code 1- Verbalizes Understanding      Biometrics:  Pre Biometrics - 06/02/20 1348      Pre Biometrics   Height 5' 6.1" (1.679 m)    Weight 258 lb 1.6 oz (117.1 kg)    BMI (Calculated) 41.53    Single Leg Stand 1.2 seconds            Nutrition Therapy Plan and Nutrition Goals:  Nutrition Therapy & Goals - 06/30/20 1612      Nutrition Therapy   Diet Heart healthy, low Na, diabetes friendly eating    Protein (specify units) 90g    Fiber 25 grams    Whole Grain Foods 3 servings    Saturated Fats 12 max. grams    Fruits and Vegetables 8 servings/day    Sodium 1.5 grams      Personal Nutrition Goals   Nutrition Goal ST: snack of fruit add cheese (has low fat string cheese), add greek yogurt to meal of just fruit over the summer, eat meals and snacks consistently LT: eat meals and snacks with protein, fat, and fiber.    Comments [name] reports stress eating due to her family and friends going through hard times - cancer, tumors; her husband passed away from cancer when he was 28. 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 She has two sons. 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 She gave up sugar for Malena Edman except for her starbuscks coffee. She can sometimes eat distractedly. Her diabetes is undercontrol and has sometimes been lower than needed for exercise at rehab. her bathroom habits are hard, soft, then diarrhea each day - 5 days in between - has been  like this since childhood. She has no pain associated, but she can get uncomfortable and nauseous. She will drink something that helps her go to the bathroom. L: at school D: not past 6pm - weekends are a bit different. She reports it doesn't take much to keep her full. She reports drinking lots of water. During the week L: her mom makes her lunch - oatbread - peanut butter and jelly or tuna. sometimes leftover. Some fruit. D: her mother cooks day - her mother cooks chicken and sometimes pork chops, salmon, or shrimp - limits her red meat.  Her mom uses canola oil - her mom bakes it. She will have protein will salad with flavored rice with greens or peas. Over the summer sometimes she will just have fruit for dinner. She doesn't care much for bread. Discussed heart healthy eating and diabetes friendly eating.      Intervention Plan   Intervention Prescribe, educate and counsel regarding individualized specific dietary modifications aiming towards targeted core components such as weight, hypertension, lipid management, diabetes, heart failure and other comorbidities.;Nutrition handout(s) given to patient.    Expected  Outcomes Short Term Goal: Understand basic principles of dietary content, such as calories, fat, sodium, cholesterol and nutrients.;Long Term Goal: Adherence to prescribed nutrition plan.;Short Term Goal: A plan has been developed with personal nutrition goals set during dietitian appointment.           Nutrition Assessments:  MEDIFICTS Score Key:  ?70 Need to make dietary changes   40-70 Heart Healthy Diet  ? 40 Therapeutic Level Cholesterol Diet  Flowsheet Row Pulmonary Rehab from 06/02/2020 in [facility] Cardiac and Pulmonary Rehab  Picture Your Plate Total Score on Admission 76     Picture Your Plate Scores:  <16 Unhealthy dietary pattern with much room for improvement.  41-50 Dietary pattern unlikely to meet recommendations for good health and room for improvement.  51-60 More  healthful dietary pattern, with some room for improvement.   >60 Healthy dietary pattern, although there may be some specific behaviors that could be improved.   Nutrition Goals Re-Evaluation:  Nutrition Goals Re-Evaluation    [place] Name 07/30/20 1624             Goals   Comment [name] eats twice a day.  She eats lunch and dinner.  She says it doesnt bother her to not eat breakfast - lunch at school is at 11:25 am.  She uses PAM, not butter and doesnt add salt if she goes out.  She eats a burger occasionally.  She does eat every kind of vegetable.  She eats a good variety of fruit.       Expected Outcome Short: continue to drink lots of water  Long: maintain heart healthy diet              Nutrition Goals Discharge (Final Nutrition Goals Re-Evaluation):  Nutrition Goals Re-Evaluation - 07/30/20 1624      Goals   Comment [name] eats twice a day.  She eats lunch and dinner.  She says it doesnt bother her to not eat breakfast - lunch at school is at 11:25 am.  She uses PAM, not butter and doesnt add salt if she goes out.  She eats a burger occasionally.  She does eat every kind of vegetable.  She eats a good variety of fruit.    Expected Outcome Short: continue to drink lots of water  Long: maintain heart healthy diet           Psychosocial: Target Goals: Acknowledge presence or absence of significant depression and/or stress, maximize coping skills, provide positive support system. Participant is able to verbalize types and ability to use techniques and skills needed for reducing stress and depression.   Education: Stress, Anxiety, and Depression - Group verbal and visual presentation to define topics covered.  Reviews how body is impacted by stress, anxiety, and depression.  Also discusses healthy ways to reduce stress and to treat/manage anxiety and depression.  Written material given at graduation.   Education: Sleep Hygiene -Provides group verbal and written instruction about how  sleep can affect your health.  Define sleep hygiene, discuss sleep cycles and impact of sleep habits. Review good sleep hygiene tips.    Initial Review & Psychosocial Screening:  Initial Psych Review & Screening - 05/26/20 1413      Initial Review   Current issues with None Identified;Current Anxiety/Panic;History of Depression      Family Dynamics   Good Support System? Yes    Comments She has a son, aunt and lives with her 36 year old mom. Her two sisters also live  in town. She has a good outlook on her health.      Barriers   Psychosocial barriers to participate in program The patient should benefit from training in stress management and relaxation.      Screening Interventions   Interventions Encouraged to exercise;Program counselor consult;To provide support and resources with identified psychosocial needs;Provide feedback about the scores to participant    Expected Outcomes Short Term goal: Utilizing psychosocial counselor, staff and physician to assist with identification of specific Stressors or current issues interfering with healing process. Setting desired goal for each stressor or current issue identified.;Short Term goal: Identification and review with participant of any Quality of Life or Depression concerns found by scoring the questionnaire.;Long Term Goal: Stressors or current issues are controlled or eliminated.;Long Term goal: The participant improves quality of Life and PHQ9 Scores as seen by post scores and/or verbalization of changes           Quality of Life Scores:  Scores of 19 and below usually indicate a poorer quality of life in these areas.  A difference of  2-3 points is a clinically meaningful difference.  A difference of 2-3 points in the total score of the Quality of Life Index has been associated with significant improvement in overall quality of life, self-image, physical symptoms, and general health in studies assessing change in quality of  life.  PHQ-9: Recent Review Flowsheet Data    Depression screen [facility] 2/9 06/02/2020   Decreased Interest 3   Down, Depressed, Hopeless 0   PHQ - 2 Score 3   Altered sleeping 3   Tired, decreased energy 3   Change in appetite 3   Feeling bad or failure about yourself  0   Trouble concentrating 3   Moving slowly or fidgety/restless 0   Suicidal thoughts 0   PHQ-9 Score 15   Difficult doing work/chores Somewhat difficult     Interpretation of Total Score  Total Score Depression Severity:  1-4 = Minimal depression, 5-9 = Mild depression, 10-14 = Moderate depression, 15-19 = Moderately severe depression, 20-27 = Severe depression   Psychosocial Evaluation and Intervention:  Psychosocial Evaluation - 05/26/20 1419      Psychosocial Evaluation & Interventions   Interventions Encouraged to exercise with the program and follow exercise prescription;Relaxation education;Stress management education    Comments She has a son, aunt and lives with her 17 year old mom. Her two sisters also live in town. She has a good outlook on her health.    Expected Outcomes Short: Exercise regularly to support mental health and notify staff of any changes. Long: maintain mental health and well being through teaching of rehab or prescribed medications independently.    Continue Psychosocial Services  Follow up required by staff           Psychosocial Re-Evaluation:   Psychosocial Discharge (Final Psychosocial Re-Evaluation):   Education: Education Goals: Education classes will be provided on a weekly basis, covering required topics. Participant will state understanding/return demonstration of topics presented.  Learning Barriers/Preferences:  Learning Barriers/Preferences - 05/26/20 1409      Learning Barriers/Preferences   Learning Barriers None    Learning Preferences None           General Pulmonary Education Topics:  Infection Prevention: - Provides verbal and written material to  individual with discussion of infection control including proper hand washing and proper equipment cleaning during exercise session. Flowsheet Row Pulmonary Rehab from 07/30/2020 in [facility] Cardiac and Pulmonary Rehab  Date  05/26/20  Educator [place]  Instruction Review Code 1- Verbalizes Understanding      Falls Prevention: - Provides verbal and written material to individual with discussion of falls prevention and safety. Flowsheet Row Pulmonary Rehab from 07/30/2020 in [facility] Cardiac and Pulmonary Rehab  Date 05/26/20  Educator [facility]  Instruction Review Code 1- Verbalizes Understanding      Chronic Lung Disease Review: - Group verbal instruction with posters, models, PowerPoint presentations and videos,  to review new updates, new respiratory medications, new advancements in procedures and treatments. Providing information on websites and "800" numbers for continued self-education. Includes information about supplement oxygen, available portable oxygen systems, continuous and intermittent flow rates, oxygen safety, concentrators, and Medicare reimbursement for oxygen. Explanation of Pulmonary Drugs, including class, frequency, complications, importance of spacers, rinsing mouth after steroid MDI's, and proper cleaning methods for nebulizers. Review of basic lung anatomy and physiology related to function, structure, and complications of lung disease. Review of risk factors. Discussion about methods for diagnosing sleep apnea and types of masks and machines for OSA. Includes a review of the use of types of environmental controls: home humidity, furnaces, filters, dust mite/pet prevention, HEPA vacuums. Discussion about weather changes, air quality and the benefits of nasal washing. Instruction on Warning signs, infection symptoms, calling MD promptly, preventive modes, and value of vaccinations. Review of effective airway clearance, coughing and/or vibration techniques. Emphasizing that all should Create an  Action Plan. Written material given at graduation. Flowsheet Row Pulmonary Rehab from 07/30/2020 in [facility] Cardiac and Pulmonary Rehab  Education need identified 06/02/20  Date 06/18/20  Educator [facility]  Instruction Review Code 1- Verbalizes Understanding      AED/CPR: - Group verbal and written instruction with the use of models to demonstrate the basic use of the AED with the basic ABC's of resuscitation.    Anatomy and Cardiac Procedures: - Group verbal and visual presentation and models provide information about basic cardiac anatomy and function. Reviews the testing methods done to diagnose heart disease and the outcomes of the test results. Describes the treatment choices: Medical Management, Angioplasty, or Coronary Bypass Surgery for treating various heart conditions including Myocardial Infarction, Angina, Valve Disease, and Cardiac Arrhythmias.  Written material given at graduation.   Medication Safety: - Group verbal and visual instruction to review commonly prescribed medications for heart and lung disease. Reviews the medication, class of the drug, and side effects. Includes the steps to properly store meds and maintain the prescription regimen.  Written material given at graduation.   Other: -Provides group and verbal instruction on various topics (see comments)   Knowledge Questionnaire Score:  Knowledge Questionnaire Score - 06/02/20 1349      Knowledge Questionnaire Score   Pre Score 16/18 Education Focus: O2 safety            Core Components/Risk Factors/Patient Goals at Admission:  Personal Goals and Risk Factors at Admission - 06/02/20 1350      Core Components/Risk Factors/Patient Goals on Admission    Weight Management Yes;Weight Loss;Obesity    Intervention Weight Management: Develop a combined nutrition and exercise program designed to reach desired caloric intake, while maintaining appropriate intake of nutrient and fiber, sodium and fats, and appropriate  energy expenditure required for the weight goal.;Weight Management: Provide education and appropriate resources to help participant work on and attain dietary goals.;Weight Management/Obesity: Establish reasonable short term and long term weight goals.;Obesity: Provide education and appropriate resources to help participant work on and attain dietary goals.  Admit Weight 258 lb 1.6 oz (117.1 kg)    Goal Weight: Short Term 250 lb (113.4 kg)    Goal Weight: Long Term 240 lb (108.9 kg)    Expected Outcomes Short Term: Continue to assess and modify interventions until short term weight is achieved;Long Term: Adherence to nutrition and physical activity/exercise program aimed toward attainment of established weight goal;Weight Loss: Understanding of general recommendations for a balanced deficit meal plan, which promotes 1-2 lb weight loss per week and includes a negative energy balance of [phone] kcal/d;Understanding recommendations for meals to include 15-35% energy as protein, 25-35% energy from fat, 35-60% energy from carbohydrates, less than 200mg  of dietary cholesterol, 20-35 gm of total fiber daily;Understanding of distribution of calorie intake throughout the day with the consumption of 4-5 meals/snacks    Improve shortness of breath with ADL's Yes    Intervention Provide education, individualized exercise plan and daily activity instruction to help decrease symptoms of SOB with activities of daily living.    Expected Outcomes Short Term: Improve cardiorespiratory fitness to achieve a reduction of symptoms when performing ADLs;Long Term: Be able to perform more ADLs without symptoms or delay the onset of symptoms    Diabetes Yes    Intervention Provide education about signs/symptoms and action to take for hypo/hyperglycemia.;Provide education about proper nutrition, including hydration, and aerobic/resistive exercise prescription along with prescribed medications to achieve blood glucose in normal  ranges: Fasting glucose 65-99 mg/dL    Expected Outcomes Short Term: Participant verbalizes understanding of the signs/symptoms and immediate care of hyper/hypoglycemia, proper foot care and importance of medication, aerobic/resistive exercise and nutrition plan for blood glucose control.;Long Term: Attainment of HbA1C < 7%.    Hypertension Yes    Intervention Provide education on lifestyle modifcations including regular physical activity/exercise, weight management, moderate sodium restriction and increased consumption of fresh fruit, vegetables, and low fat dairy, alcohol moderation, and smoking cessation.;Monitor prescription use compliance.    Expected Outcomes Short Term: Continued assessment and intervention until BP is < 140/16mm HG in hypertensive participants. < 130/34mm HG in hypertensive participants with diabetes, heart failure or chronic kidney disease.;Long Term: Maintenance of blood pressure at goal levels.           Education:Diabetes - Individual verbal and written instruction to review signs/symptoms of diabetes, desired ranges of glucose level fasting, after meals and with exercise. Acknowledge that pre and post exercise glucose checks will be done for 3 sessions at entry of program. Flowsheet Row Pulmonary Rehab from 07/30/2020 in [facility] Cardiac and Pulmonary Rehab  Date 05/26/20  Educator [facility]  Instruction Review Code 1- Verbalizes Understanding      Know Your Numbers and Heart Failure: - Group verbal and visual instruction to discuss disease risk factors for cardiac and pulmonary disease and treatment options.  Reviews associated critical values for Overweight/Obesity, Hypertension, Cholesterol, and Diabetes.  Discusses basics of heart failure: signs/symptoms and treatments.  Introduces Heart Failure Zone chart for action plan for heart failure.  Written material given at graduation. Flowsheet Row Pulmonary Rehab from 07/30/2020 in [facility] Cardiac and Pulmonary Rehab  Date 06/11/20   Educator [facility]  Instruction Review Code 1- Verbalizes Understanding      Core Components/Risk Factors/Patient Goals Review:   Goals and Risk Factor Review    Row Name 07/30/20 1617 07/30/20 1629           Core Components/Risk Factors/Patient Goals Review   Personal Goals Review Diabetes;Hypertension --      Review [name] has been out  sick (not  Covid).  She feels she is still recovering from being sick.  She has been very fatigued.  She has not checked sugar at home.  Her last a1 c was in March and her Dr did not say anything was wrong.  She did take prednisone while she was sick.  (not currently - only for 5 days) She is having knee replacement this summer.      Expected Outcomes -- Short: get back to consistent attendance Long:  manage risk factors             Core Components/Risk Factors/Patient Goals at Discharge (Final Review):   Goals and Risk Factor Review - 07/30/20 1629      Core Components/Risk Factors/Patient Goals Review   Review She is having knee replacement this summer.    Expected Outcomes Short: get back to consistent attendance Long:  manage risk factors           ITP Comments:  ITP Comments    Row Name 05/26/20 1405 06/02/20 1340 06/04/20 1609 06/11/20 0719 07/08/20 0805   ITP Comments Virtual Visit completed. Patient informed on EP and RD appointment and 6 Minute walk test. Patient also informed of patient health questionnaires on My Chart. Patient Verbalizes understanding. Visit diagnosis can be found in [facility] 05/14/2020. Completed 6MWT and gym orientation. Initial ITP created and sent for review to Dr. Emily Filbert, Medical Director. First full day of exercise!  Patient was oriented to gym and equipment including functions, settings, policies, and procedures.  Patient's individual exercise prescription and treatment plan were reviewed.  All starting workloads were established based on the results of the 6 minute walk test done at initial orientation visit.  The plan  for exercise progression was also introduced and progression will be customized based on patient's performance and goals. 30 Day review completed. Medical Director ITP review done, changes made as directed, and signed approval by Medical Director. Unable to complete goals this cycle - last couple of sessions patient came to rehab late and did not attend 4/18. We will discuss goals when next review cycle begins.   [place] Name 07/09/20 [phone] 07/23/20 0948 08/06/20 0629       ITP Comments 30 Day review completed. Medical Director ITP review done, changes made as directed, and signed approval by Medical Director. Doyne Keel has not attended since last review. 30 Day review completed. Medical Director ITP review done, changes made as directed, and signed approval by Medical Director.   only 2 visits in [place]            Comments:

## 2020-08-07 ENCOUNTER — Encounter: Payer: Medicaid Other | Admitting: *Deleted

## 2020-08-07 ENCOUNTER — Other Ambulatory Visit: Payer: Self-pay

## 2020-08-07 DIAGNOSIS — R0600 Dyspnea, unspecified: Secondary | ICD-10-CM | POA: Diagnosis not present

## 2020-08-07 DIAGNOSIS — R0609 Other forms of dyspnea: Secondary | ICD-10-CM

## 2020-08-07 NOTE — Progress Notes (Signed)
Daily Session Note  Patient Details  Name: Katherine Estes MRN: [id number] Date of Birth: 09/10/1960 Referring Provider:   Flowsheet Row Pulmonary Rehab from 06/02/2020 in [facility] Cardiac and Pulmonary Rehab  Referring Provider Hulan Fess MD      Encounter Date: 08/07/2020  Check In:  Session Check In - 08/07/20 1601      Check-In   Supervising physician immediately available to respond to emergencies See telemetry face sheet for immediately available ER MD    Location [facility]-Cardiac & Pulmonary Rehab    Staff Present [name], RN BSN;Joseph [address] [place], [place], RCEP, CCRP, CCET    Virtual Visit No    Medication changes reported     No    Fall or balance concerns reported    No    Warm-up and Cool-down Performed on first and last piece of equipment    Resistance Training Performed Yes    VAD Patient? No    PAD/SET Patient? No      Pain Assessment   Currently in Pain? No/denies              Social History   Tobacco Use  Smoking Status Former Smoker  . Packs/day: 0.25  . Years: 3.00  . Pack years: 0.75  . Types: Cigarettes  . Quit date: 05/21/1991  . Years since quitting: 29.2  Smokeless Tobacco Never Used  Tobacco Comment   she only smoked when she drank    Goals Met:  Independence with exercise equipment Exercise tolerated well No report of cardiac concerns or symptoms Strength training completed today  Goals Unmet:  Not Applicable  Comments: Pt able to follow exercise prescription today without complaint.  Will continue to monitor for progression.    Dr. Emily Filbert is Medical Director for [place] and [facility].

## 2020-08-13 ENCOUNTER — Other Ambulatory Visit: Payer: Self-pay | Admitting: Orthopedic Surgery

## 2020-08-14 ENCOUNTER — Other Ambulatory Visit: Payer: Self-pay

## 2020-08-14 ENCOUNTER — Encounter: Payer: Self-pay | Admitting: Emergency Medicine

## 2020-08-14 ENCOUNTER — Emergency Department: Payer: Medicaid Other

## 2020-08-14 ENCOUNTER — Emergency Department
Admission: EM | Admit: 2020-08-14 | Discharge: 2020-08-14 | Disposition: A | Discharge status: Home / Self Care | Payer: Medicaid Other | Attending: Emergency Medicine | Admitting: Emergency Medicine

## 2020-08-14 DIAGNOSIS — J4540 Moderate persistent asthma, uncomplicated: Secondary | ICD-10-CM | POA: Diagnosis not present

## 2020-08-14 DIAGNOSIS — Z20822 Contact with and (suspected) exposure to covid-19: Secondary | ICD-10-CM | POA: Diagnosis not present

## 2020-08-14 DIAGNOSIS — Z79899 Other long term (current) drug therapy: Secondary | ICD-10-CM | POA: Insufficient documentation

## 2020-08-14 DIAGNOSIS — J4531 Mild persistent asthma with (acute) exacerbation: Secondary | ICD-10-CM | POA: Insufficient documentation

## 2020-08-14 DIAGNOSIS — Z7984 Long term (current) use of oral hypoglycemic drugs: Secondary | ICD-10-CM | POA: Insufficient documentation

## 2020-08-14 DIAGNOSIS — E119 Type 2 diabetes mellitus without complications: Secondary | ICD-10-CM | POA: Diagnosis not present

## 2020-08-14 DIAGNOSIS — E039 Hypothyroidism, unspecified: Secondary | ICD-10-CM | POA: Insufficient documentation

## 2020-08-14 DIAGNOSIS — J069 Acute upper respiratory infection, unspecified: Secondary | ICD-10-CM | POA: Diagnosis not present

## 2020-08-14 DIAGNOSIS — Z87891 Personal history of nicotine dependence: Secondary | ICD-10-CM | POA: Insufficient documentation

## 2020-08-14 DIAGNOSIS — R059 Cough, unspecified: Secondary | ICD-10-CM | POA: Diagnosis present

## 2020-08-14 DIAGNOSIS — I10 Essential (primary) hypertension: Secondary | ICD-10-CM | POA: Diagnosis not present

## 2020-08-14 LAB — RESP PANEL BY RT-PCR (FLU A&B, COVID) ARPGX2
Influenza A by PCR: NEGATIVE
Influenza B by PCR: NEGATIVE
SARS Coronavirus 2 by RT PCR: NEGATIVE

## 2020-08-14 MED ORDER — PREDNISONE 10 MG PO TABS: ORAL_TABLET | ORAL | 0 refills | Status: DC

## 2020-08-14 NOTE — Discharge Instructions (Addendum)
Follow-up with your primary care provider if any continued problems or not improving.  Continue your nebulizer and inhalers for your asthma.  A prescription for prednisone was sent to your pharmacy to help with your asthma which should help with the cough and wheezing.  You may continue taking the benzonatate with the prednisone.  Tylenol or ibuprofen as needed for body aches, fever, headache or throat pain.  Increase fluids.  COVID and influenza test were negative.  Chest x-ray was unchanged.

## 2020-08-14 NOTE — ED Provider Notes (Signed)
[facility] Emergency Department Provider Note  ____________________________________________   Event Date/Time   First MD Initiated Contact with Patient 08/14/20 0820     (approximate)  I have reviewed the triage vital signs and the nursing notes.   HISTORY  Chief Complaint Cough and Shortness of Breath   HPI Katherine Estes is a 60 y.o. female presents to the ED with complaint of congestion, productive cough and shortness of breath.  Patient states that she was with her friend over the weekend who tested positive for COVID.  Patient went to urgent care on 08/11/2020 and tested negative.  Patient was given Ladona Ridgel which she states is not helping.  She states "this is a sickest I have ever been".  Patient also has underlying asthma and continues to use her inhalers that she has at home.  She rates her pain as 10/10.       Past Medical History:  Diagnosis Date  . Allergy   . Anemia   . Arthritis   . Asthma   . Diabetes mellitus without complication ([place])   . Hypertension   . Hypothyroidism   . Osteoporosis   . Thyroid disease     Patient Active Problem List   Diagnosis Date Noted  . Nephrolithiasis 06/13/2019  . Sepsis ([place]) 05/29/2017  . Bacteremia 05/26/2017  . 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 Nipple discharge 06/26/2014  . Papule of skin 06/26/2014  . Cervical high risk HPV (human papillomavirus) test positive 03/29/2013  . Hypothyroid 03/27/2013  . Hypertension 03/27/2013  . Migraine headache 03/27/2013  . Asthma, moderate persistent 03/27/2013  . Allergic rhinitis 03/27/2013  . Irregular menses 12/24/2010  . Fibroids 12/24/2010    Past Surgical History:  Procedure Laterality Date  . BLADDER SURGERY     BLADDER LIFTED  . BREAST BIOPSY Left    neg  . BREAST SURGERY     LUMP EXCISION LEFT BREAST.  Marland Kitchen CESAREAN SECTION    . COLONOSCOPY WITH PROPOFOL N/A 04/11/2020   Procedure: COLONOSCOPY WITH PROPOFOL;  Surgeon: Jonathon Bellows, MD;  Location: [facility] ENDOSCOPY;  Service: Endoscopy;  Laterality: N/A;  . THYROIDECTOMY    . TONSILLECTOMY    . WISDOM TOOTH EXTRACTION      Prior to Admission medications   Medication Sig Start Date End Date Taking? Authorizing Provider  predniSONE (DELTASONE) 10 MG tablet Take 6 tablets  today, on day 2 take 5 tablets, day 3 take 4 tablets, day 4 take 3 tablets, day 5 take  2 tablets and 1 tablet the last day 08/14/20  Yes Letitia Neri L, PA-C  albuterol (VENTOLIN HFA) 108 (90 Base) MCG/ACT inhaler Inhale 1-2 puffs into the lungs every 6 (six) hours as needed for wheezing or shortness of breath.    [provider]  amLODipine (NORVASC) 5 MG tablet Take 5 mg by mouth daily.    [provider]  benzonatate (TESSALON) 200 MG capsule Take 200 mg by mouth 3 (three) times daily as needed for cough.    [provider]  cholecalciferol (VITAMIN D) 1000 units tablet Take 1,000 Units by mouth daily.    [provider]  Fluticasone-Salmeterol (ADVAIR) 500-50 MCG/DOSE AEPB Inhale 1 puff into the lungs daily as needed (shortness of breath or wheezing).    [provider]  hydrochlorothiazide (HYDRODIURIL) 25 MG tablet Take 25 mg by mouth daily.    [provider]  levothyroxine (SYNTHROID) 150 MCG tablet Take 150 mcg by mouth daily before  breakfast.    [provider]  losartan (COZAAR) 50 MG tablet Take 50 mg by mouth daily.    [provider]  metFORMIN (GLUCOPHAGE) 500 MG tablet Take 500 mg by mouth daily with breakfast.    [provider]  montelukast (SINGULAIR) 10 MG tablet Take 10 mg by mouth at bedtime.    [provider]  potassium chloride (KLOR-CON) 10 MEQ tablet Take 10 mEq by mouth daily. 01/21/20   [provider]  triamcinolone cream (KENALOG) 0.1 % Apply 1 application topically 2 (two) times daily.    [provider]    Allergies Cinnamon and Penicillins  Family History   Problem Relation Age of Onset  . Arthritis Mother   . Asthma Mother   . Hypertension Mother   . Cancer Maternal Grandmother        THYRIOD  . Breast cancer Neg Hx     Social History Social History   Tobacco Use  . Smoking status: Former Smoker    Packs/day: 0.25    Years: 3.00    Pack years: 0.75    Types: Cigarettes    Quit date: 05/21/1991    Years since quitting: 29.2  . Smokeless tobacco: Never Used  . Tobacco comment: she only smoked when she drank  Vaping Use  . Vaping Use: Never used  Substance Use Topics  . Alcohol use: Yes    Alcohol/week: 0.0 standard drinks    Comment: socially   . Drug use: No    Review of Systems Constitutional: No fever/chills Eyes: No visual changes. ENT: Positive sore throat. Cardiovascular: Denies chest pain. Respiratory: Positive shortness of breath.  Positive productive cough. Gastrointestinal: No abdominal pain.  No nausea, no vomiting.  No diarrhea.  No constipation. Genitourinary: Negative for dysuria. Musculoskeletal: Positive for body aches. Skin: Negative for rash. Neurological: Negative for headaches, focal weakness or numbness.   ____________________________________________   PHYSICAL EXAM:  VITAL SIGNS: ED Triage Vitals  Enc Vitals Group     BP 08/14/20 0808 (!) 151/80     Pulse Rate 08/14/20 0808 86     Resp 08/14/20 0808 18     Temp 08/14/20 0808 98.7 F (37.1 C)     Temp Source 08/14/20 0808 Oral     SpO2 08/14/20 0808 96 %     Weight 08/14/20 0809 260 lb (117.9 kg)     Height 08/14/20 0809 5\' 6"  (1.676 m)     Head Circumference --      Peak Flow --      Pain Score 08/14/20 0809 10     Pain Loc --      Pain Edu? --      Excl. in [place]? --     Constitutional: Alert and oriented. Well appearing and in no acute distress. Eyes: Conjunctivae are normal. PERRL. EOMI. Head: Atraumatic. Nose: No congestion/rhinnorhea. Mouth/Throat: Mucous membranes are moist.  Oropharynx non-erythematous.  Uvula is midline  and no exudate is noted. Neck: No stridor.  No cervical lymphadenopathy noted. Cardiovascular: Normal rate, regular rhythm. Grossly normal heart sounds.  Good peripheral circulation. Respiratory: Normal respiratory effort.  No retractions. Lungs bilateral expiratory wheezes are heard throughout.  Is able to talk in complete sentences without any difficulty. Gastrointestinal: Soft and nontender. No distention.  Bowel sounds normoactive x4 quadrants.  No CVA tenderness. Musculoskeletal: No lower extremity tenderness nor edema.  No joint effusions. Neurologic:  Normal speech and language. No gross focal neurologic deficits are appreciated. No gait instability.  Skin:  Skin is warm, dry and intact. No rash noted. Psychiatric: Mood and affect are normal. Speech and behavior are normal.  ____________________________________________   LABS (all labs ordered are listed, but only abnormal results are displayed)  Labs Reviewed  RESP PANEL BY RT-PCR (FLU A&B, COVID) [id number]   ____________________________________________  EKG Reviewed by physician and major ED. Normal sinus rhythm with ventricular rate of 88.  Incomplete right bundle branch block. PR interval 170 QRS duration 94 ____________________________________________  RADIOLOGY I, Johnn Hai, personally viewed and evaluated these images (plain radiographs) as part of my medical decision making, as well as reviewing the written report by the radiologist.   Official radiology report(s): DG Chest 2 View  Result Date: 08/14/2020 CLINICAL DATA:  Shortness of breath and cough EXAM: CHEST - 2 VIEW COMPARISON:  03/27/2018 FINDINGS: Cardiac shadow is stable. Mild central vascular congestion is noted without edema. The lungs are well aerated bilaterally. No focal infiltrate or effusion is seen. No acute bony abnormality is noted. IMPRESSION: Mild central vascular congestion without edema. No other focal abnormality is noted. Electronically  Signed   By: Inez Catalina M.D.   On: 08/14/2020 08:46    ____________________________________________   PROCEDURES  Procedure(s) performed (including Critical Care):  Procedures   ____________________________________________   INITIAL IMPRESSION / ASSESSMENT AND PLAN / ED COURSE  As part of my medical decision making, I reviewed the following data within the electronic MEDICAL RECORD NUMBER Notes from prior ED visits and [place] Controlled Substance Database  60 year old female presents to the ED with complaint of cough and shortness of breath.  Patient states that she was tested for COVID which was negative.  Patient reports that she also has underlying asthma and has been using her inhalers without complete resolution.  Patient denies any fever or chills currently.  Respiratory panel was repeated and COVID and influenza were negative.  Chest x-ray was negative for any acute changes.  Patient currently is using 2 inhalers at home and a nebulizer machine.  A prescription for prednisone was sent to her pharmacy and patient reports that this is helped with her asthma in the past.  She is to follow-up with her PCP if any continued problems.  She is return to the emergency department if any severe worsening of her symptoms or difficulty breathing.  Patient remained stable with an O2 sat of 96% and is afebrile.  ____________________________________________   FINAL CLINICAL IMPRESSION(S) / ED DIAGNOSES  Final diagnoses:  Viral URI with cough  Mild persistent asthma with exacerbation     ED Discharge Orders         Ordered    predniSONE (DELTASONE) 10 MG tablet        08/14/20 1047          *Please note:  Katherine Estes was evaluated in Emergency Department on 08/14/2020 for the symptoms described in the history of present illness. She was evaluated in the context of the global COVID-19 pandemic, which necessitated consideration that the patient might be at risk for infection with the  SARS-CoV-2 virus that causes COVID-19. Institutional protocols and algorithms that pertain to the evaluation of patients at risk for COVID-19 are in a state of rapid change based on information released by regulatory bodies including the CDC and federal and state organizations. These policies and algorithms were followed during the patient's care in the ED.  Some ED evaluations and interventions may be delayed as a result of limited staffing during and the  pandemic.*   Note:  This document was prepared using Dragon voice recognition software and may include unintentional dictation errors.    Johnn Hai, PA-C 08/14/20 1555    Harvest Dark, MD 08/15/20 1942

## 2020-08-14 NOTE — ED Notes (Signed)
See triage note  Presents with cough and congestion  States cough is productive  Greenish phlegm  No fever  States she was seen couple of days ago  Tested negative for COVID at that time   Afebrile on arrival

## 2020-08-14 NOTE — ED Triage Notes (Signed)
Pt comes into the ED via POV c/o cough and SHOB.  Pt dx with URI earlier this week and given cough medication, but states it is not getting any better and her Katherine Estes has progressed.  Pt ambulatory to triage at this time with even and unlabored respirations.  Pt does have underlying asthma.  No audible wheezing in triage at this time.  Pt admits to high mucous production and her COVID test was negative.

## 2020-08-20 ENCOUNTER — Telehealth: Payer: Self-pay | Admitting: *Deleted

## 2020-08-20 ENCOUNTER — Encounter: Payer: Self-pay | Admitting: *Deleted

## 2020-08-20 DIAGNOSIS — R0609 Other forms of dyspnea: Secondary | ICD-10-CM

## 2020-08-20 DIAGNOSIS — R0600 Dyspnea, unspecified: Secondary | ICD-10-CM

## 2020-08-20 NOTE — Telephone Encounter (Signed)
Pt left [place] a message about staying out until her knee surgery with URI.  Called back to talk and left message.  We may need to go ahead and discharge as she will be out at least 6 weeks post knee.

## 2020-08-22 ENCOUNTER — Inpatient Hospital Stay
Admission: RE | Admit: 2020-08-22 | Discharge: 2020-08-22 | Disposition: A | Discharge status: Home / Self Care | Payer: Medicaid Other | Source: Ambulatory Visit

## 2020-08-22 DIAGNOSIS — R0600 Dyspnea, unspecified: Secondary | ICD-10-CM

## 2020-08-22 DIAGNOSIS — R0609 Other forms of dyspnea: Secondary | ICD-10-CM

## 2020-08-22 NOTE — Progress Notes (Signed)
Discharge Progress Report  Patient Details  Name: Katherine Estes MRN: [id number] Date of Birth: June 20, 1960 Referring Provider:   Flowsheet Row Pulmonary Rehab from 06/02/2020 in [facility] Cardiac [facility] Pulmonary [facility]  Referring Provider Katherine Fess MD       Number of Visits: 15  Reason for Discharge:  Early Exit:  Personal  Smoking History:  Social History   Tobacco Use  Smoking Status Former Smoker  . Packs/day: 0.25  . Years: 3.00  . Pack years: 0.75  . Types: Cigarettes  . Quit date: 05/21/1991  . Years since quitting: 29.2  Smokeless Tobacco Never Used  Tobacco Comment   she only smoked when she drank    Diagnosis:  DOE (dyspnea on exertion)  ADL UCSD:  Pulmonary Assessment Scores    Row Name 06/02/20 1351         ADL UCSD   ADL Phase Entry     SOB Score total 79     Rest 0     Walk 3     Stairs 5     Bath 3     Dress 2     Shop 5           CAT Score   CAT Score 26           mMRC Score   mMRC Score 4            Initial Exercise Prescription:  Initial Exercise Prescription - 06/02/20 1300      Date of Initial Exercise RX and Referring Provider   Date 06/02/20    Referring Provider Katherine Fess MD      Treadmill   MPH 1.4    Grade 0.5    Minutes 15    METs 2.17      Recumbant Bike   Level 1    RPM 50    Watts 8    Minutes 15    METs 1.7      NuStep   Level 1    SPM 80    Minutes 15    METs 1.7      REL-XR   Level 1    Speed 50    Minutes 15    METs 1.7      Prescription Details   Frequency (times per week) 3    Duration Progress to 30 minutes of continuous aerobic without signs/symptoms of physical distress      Intensity   THRR 40-80% of Max Heartrate 107-143    Ratings of Perceived Exertion 11-13    Perceived Dyspnea 0-4      Progression   Progression Continue to progress workloads to maintain intensity without signs/symptoms of physical distress.      Resistance Training   Training Prescription Yes     Weight 3 lb    Reps 10-15           Discharge Exercise Prescription (Final Exercise Prescription Changes):  Exercise Prescription Changes - 08/07/20 1400      Response to Exercise   Blood Pressure (Admit) 124/66    Blood Pressure (Exercise) 138/76    Blood Pressure (Exit) 124/76    Heart Rate (Admit) 88 bpm    Heart Rate (Exercise) 89 bpm    Heart Rate (Exit) 83 bpm    Oxygen Saturation (Admit) 96 %    Oxygen Saturation (Exercise) 95 %    Oxygen Saturation (Exit) 96 %    Rating of Perceived Exertion (Exercise) 11  Perceived Dyspnea (Exercise) 1    Symptoms none    Duration Continue with 30 min of aerobic exercise without signs/symptoms of physical distress.    Intensity THRR unchanged      Progression   Progression Continue to progress workloads to maintain intensity without signs/symptoms of physical distress.    Average METs 1.9      Resistance Training   Training Prescription Yes    Weight 4 lb    Reps 10-15      Interval Training   Interval Training No      NuStep   Level 2    Minutes 30    METs 1.9      Home Exercise Plan   Plans to continue exercise at Home (comment)   walking, [employer]   Frequency Add 2 additional days to program exercise sessions.    Initial Home Exercises Provided 06/30/20           Functional Capacity:  6 Minute Walk    Row Name 06/02/20 1340         6 Minute Walk   Phase Initial     Distance 700 feet     Walk Time 4.96 minutes     # of Rest Breaks 4  10 sec, 22 sec, 15 sec, 15 sec     MPH 1.6     METS 1.76     RPE 13     Perceived Dyspnea  1     VO2 Peak 6.17     Symptoms Yes (comment)     Comments SOB, chronic knee pain     Resting HR 76 bpm     Resting BP 126/64     Resting Oxygen Saturation  996 %     Exercise Oxygen Saturation  during 6 min walk 94 %     Max Ex. HR 98 bpm     Max Ex. BP 150/64     2 Minute Post BP 144/66           Interval HR   1 Minute HR 86     2 Minute HR 91     3 Minute HR 89     4  Minute HR 94     5 Minute HR 85     6 Minute HR 98     2 Minute Post HR 75     Interval Heart Rate? Yes           Interval Oxygen   Interval Oxygen? Yes     Baseline Oxygen Saturation % 96 %     1 Minute Oxygen Saturation % 95 %     1 Minute Liters of Oxygen 0 L  Room Air     2 Minute Oxygen Saturation % 94 %     2 Minute Liters of Oxygen 0 L     3 Minute Oxygen Saturation % 94 %     3 Minute Liters of Oxygen 0 L     4 Minute Oxygen Saturation % 95 %     4 Minute Liters of Oxygen 0 L     5 Minute Oxygen Saturation % 94 %     5 Minute Liters of Oxygen 0 L     6 Minute Oxygen Saturation % 94 %     6 Minute Liters of Oxygen 0 L     2 Minute Post Oxygen Saturation % 95 %     2 Minute Post Liters of Oxygen 0 L  Psychological, QOL, Others - Outcomes: PHQ 2/9: Depression screen PHQ 2/9 06/02/2020  Decreased Interest 3  Down, Depressed, Hopeless 0  PHQ - 2 Score 3  Altered sleeping 3  Tired, decreased energy 3  Change in appetite 3  Feeling bad or failure about yourself  0  Trouble concentrating 3  Moving slowly or fidgety/restless 0  Suicidal thoughts 0  PHQ-9 Score 15  Difficult doing work/chores Somewhat difficult    Quality of Life:     Nutrition & Weight - Outcomes:  Pre Biometrics - 06/02/20 1348      Pre Biometrics   Height 5' 6.1" (1.679 m)    Weight 258 lb 1.6 oz (117.1 kg)    BMI (Calculated) 41.53    Single Leg Stand 1.2 seconds            Nutrition:  Nutrition Therapy & Goals - 06/30/20 1612      Nutrition Therapy   Diet Heart healthy, low Na, diabetes friendly eating    Protein (specify units) 90g    Fiber 25 grams    Whole Grain Foods 3 servings    Saturated Fats 12 max. grams    Fruits and Vegetables 8 servings/day    Sodium 1.5 grams      Personal Nutrition Goals   Nutrition Goal ST: snack of fruit add cheese (has low fat string cheese), add greek yogurt to meal of just fruit over the summer, eat meals and snacks  consistently LT: eat meals and snacks with protein, fat, and fiber.    Comments Katherine Estes reports stress eating due to her family and friends going through hard times - cancer, tumors; her husband passed away from cancer when he was 18. 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 She has two sons. 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 She gave up sugar for Malena Edman except for her starbuscks coffee. She can sometimes eat distractedly. Her diabetes is undercontrol and has sometimes been lower than needed for exercise at rehab. her bathroom habits are hard, soft, then diarrhea each day - 5 days in between - has been like this since childhood. She has no pain associated, but she can get uncomfortable and nauseous. She will drink something that helps her go to the bathroom. L: at school D: not past 6pm - weekends are a bit different. She reports it doesn't take much to keep her full. She reports drinking lots of water. During the week L: her mom makes her lunch - oatbread - peanut butter and jelly or tuna. sometimes leftover. Some fruit. D: her mother cooks day - her mother cooks chicken and sometimes pork chops, salmon, or shrimp - limits her red meat.  Her mom uses canola oil - her mom bakes it. She will have protein will salad with flavored rice with greens or peas. Over the summer sometimes she will just have fruit for dinner. She doesn't care much for bread. Discussed heart healthy eating and diabetes friendly eating.      Intervention Plan   Intervention Prescribe, educate and counsel regarding individualized specific dietary modifications aiming towards targeted core components such as weight, hypertension, lipid management, diabetes, heart failure and other comorbidities.;Nutrition handout(s) given to patient.    Expected Outcomes Short Term Goal: Understand basic principles of dietary  content, such as calories, fat, sodium, cholesterol and nutrients.;Long Term Goal:  Adherence to prescribed nutrition plan.;Short Term Goal: A plan has been developed with personal nutrition goals set during dietitian appointment.           Nutrition Discharge:   Education Questionnaire Score:  Knowledge Questionnaire Score - 06/02/20 1349      Knowledge Questionnaire Score   Pre Score 16/18 Education Focus: O2 safety           Goals reviewed with patient; copy given to patient.

## 2020-08-22 NOTE — Patient Instructions (Signed)
Your procedure is scheduled on: 09/02/20 - Tuesday Report to the Registration Desk on the 1st floor of the [facility]. To find out your arrival time, please call [phone] between 1PM - 3PM on: 09/01/20 - Monday Report to [facility] on 08/29/20 at 10 am for Covid Test.  REMEMBER: Instructions that are not followed completely may result in serious medical risk, up to and including death; or upon the discretion of your surgeon and anesthesiologist your surgery may need to be rescheduled.  Do not eat food or drink fluid after midnight the night before surgery.  No gum chewing, lozengers or hard candies.   TAKE THESE MEDICATIONS THE MORNING OF SURGERY WITH A SIP OF WATER:  - amLODipine (NORVASC) 5 MG tablet - Fluticasone-Salmeterol (ADVAIR) 500-50 MCG/DOSE AEPB - levothyroxine (SYNTHROID) 150 MCG tablet  Use albuterol (VENTOLIN HFA) 108 (90 Base) MCG/ACT inhaler on the day of surgery and bring to the hospital.  Stop Metformin 2 days prior to surgery. Do not take 06/12, 06/13, and do not take the day of surgery.  One week prior to surgery: Stop Anti-inflammatories (NSAIDS) such as Advil, Aleve, Ibuprofen, Motrin, Naproxen, Naprosyn and Aspirin based products such as Excedrin, Goodys Powder, BC Powder.  Stop beginning 08/25/20 ANY OVER THE COUNTER supplements until after surgery.  No Alcohol for 24 hours before or after surgery.  No Smoking including e-cigarettes for 24 hours prior to surgery.  No chewable tobacco products for at least 6 hours prior to surgery.  No nicotine patches on the day of surgery.  Do not use any "recreational" drugs for at least a week prior to your surgery.  Please be advised that the combination of cocaine and anesthesia may have negative outcomes, up to and including death. If you test positive for cocaine, your surgery will be cancelled.  On the morning of surgery brush your teeth with toothpaste and water, you may rinse your mouth with mouthwash  if you wish. Do not swallow any toothpaste or mouthwash.  Do not wear jewelry, make-up, hairpins, clips or nail polish.  Do not wear lotions, powders, or perfumes.   Do not shave body from the neck down 48 hours prior to surgery just in case you cut yourself which could leave a site for infection.  Also, freshly shaved skin may become irritated if using the CHG soap.  Contact lenses, hearing aids and dentures may not be worn into surgery.  Do not bring valuables to the hospital. [facility] is not responsible for any missing/lost belongings or valuables.   Use CHG Soap or wipes as directed on instruction sheet.  Notify your doctor if there is any change in your medical condition (cold, fever, infection).  Wear comfortable clothing (specific to your surgery type) to the hospital.  Plan for stool softeners for home use; pain medications have a tendency to cause constipation. You can also help prevent constipation by eating foods high in fiber such as fruits and vegetables and drinking plenty of fluids as your diet allows.  After surgery, you can help prevent lung complications by doing breathing exercises.  Take deep breaths and cough every 1-2 hours. Your doctor may order a device called an Incentive Spirometer to help you take deep breaths. When coughing or sneezing, hold a pillow firmly against your incision with both hands. This is called "splinting." Doing this helps protect your incision. It also decreases belly discomfort.  If you are being admitted to the hospital overnight, leave your suitcase in the car.  After surgery it may be brought to your room.  If you are being discharged the day of surgery, you will not be allowed to drive home. You will need a responsible adult (18 years or older) to drive you home and stay with you that night.   If you are taking public transportation, you will need to have a responsible adult (18 years or older) with you. Please confirm with your  physician that it is acceptable to use public transportation.   Please call the [facility] at [phone] if you have any questions about these instructions.  Surgery Visitation Policy:  Patients undergoing a surgery or procedure may have one family member or support person with them as long as that person is not COVID-19 positive or experiencing its symptoms.  That person may remain in the waiting area during the procedure.  Inpatient Visitation:    Visiting hours are 7 a.m. to 8 p.m. Inpatients will be allowed two visitors daily. The visitors may change each day during the patient's stay. No visitors under the age of 62. Any visitor under the age of 78 must be accompanied by an adult. The visitor must pass COVID-19 screenings, use hand sanitizer when entering and exiting the patient's room and wear a mask at all times, including in the patient's room. Patients must also wear a mask when staff or their visitor are in the room. Masking is required regardless of vaccination status.  Total Hip/Knee Replacement Preoperative Educational Video  To better prepare for surgery, please view our videos that explain the physical activity and discharge planning required to have the best surgical recovery at [facility].  [email or web address]      Questions? Call [phone] or email [email or web address]

## 2020-08-22 NOTE — Progress Notes (Signed)
Pulmonary Individual Treatment Plan  Patient Details  Name: Katherine Estes MRN: [id number] Date of Birth: 08-19-1960 Referring Provider:   Flowsheet Row Pulmonary Rehab from 06/02/2020 in [facility] Cardiac and Pulmonary Rehab  Referring Provider Hulan Fess MD      Initial Encounter Date:  Flowsheet Row Pulmonary Rehab from 06/02/2020 in [facility] Cardiac and Pulmonary Rehab  Date 06/02/20      Visit Diagnosis: DOE (dyspnea on exertion)  Patient's Home Medications on Admission:  Current Outpatient Medications:  .  albuterol (VENTOLIN HFA) 108 (90 Base) MCG/ACT inhaler, Inhale 1-2 puffs into the lungs every 6 (six) hours as needed for wheezing or shortness of breath., Disp: , Rfl:  .  amLODipine (NORVASC) 5 MG tablet, Take 5 mg by mouth daily., Disp: , Rfl:  .  benzonatate (TESSALON) 200 MG capsule, Take 200 mg by mouth 3 (three) times daily as needed for cough., Disp: , Rfl:  .  cholecalciferol (VITAMIN D) 1000 units tablet, Take 1,000 Units by mouth daily., Disp: , Rfl:  .  Fluticasone-Salmeterol (ADVAIR) 500-50 MCG/DOSE AEPB, Inhale 1 puff into the lungs daily as needed (shortness of breath or wheezing)., Disp: , Rfl:  .  hydrochlorothiazide (HYDRODIURIL) 25 MG tablet, Take 25 mg by mouth daily., Disp: , Rfl:  .  levothyroxine (SYNTHROID) 150 MCG tablet, Take 150 mcg by mouth daily before breakfast., Disp: , Rfl:  .  losartan (COZAAR) 50 MG tablet, Take 50 mg by mouth daily., Disp: , Rfl:  .  metFORMIN (GLUCOPHAGE) 500 MG tablet, Take 500 mg by mouth daily with breakfast., Disp: , Rfl:  .  montelukast (SINGULAIR) 10 MG tablet, Take 10 mg by mouth at bedtime., Disp: , Rfl:  .  potassium chloride (KLOR-CON) 10 MEQ tablet, Take 10 mEq by mouth daily., Disp: , Rfl:  .  predniSONE (DELTASONE) 10 MG tablet, Take 6 tablets  today, on day 2 take 5 tablets, day 3 take 4 tablets, day 4 take 3 tablets, day 5 take  2 tablets and 1 tablet the last day, Disp: 21 tablet, Rfl: 0 .  triamcinolone  cream (KENALOG) 0.1 %, Apply 1 application topically 2 (two) times daily., Disp: , Rfl:   Past Medical History: Past Medical History:  Diagnosis Date  . Allergy   . Anemia   . Arthritis   . Asthma   . Diabetes mellitus without complication ([place])   . Hypertension   . Hypothyroidism   . Osteoporosis   . Thyroid disease     Tobacco Use: Social History   Tobacco Use  Smoking Status Former Smoker  . Packs/day: 0.25  . Years: 3.00  . Pack years: 0.75  . Types: Cigarettes  . Quit date: 05/21/1991  . Years since quitting: 29.2  Smokeless Tobacco Never Used  Tobacco Comment   she only smoked when she drank    Labs: Recent Review Flowsheet Data   There is no flowsheet data to display.      Pulmonary Assessment Scores:  Pulmonary Assessment Scores    Row Name 06/02/20 1351         ADL UCSD   ADL Phase Entry     SOB Score total 79     Rest 0     Walk 3     Stairs 5     Bath 3     Dress 2     Shop 5           CAT Score   CAT Score 26  mMRC Score   mMRC Score 4            UCSD: Self-administered rating of dyspnea associated with activities of daily living (ADLs) 6-point scale (0 = "not at all" to 5 = "maximal or unable to do because of breathlessness")  Scoring Scores range from 0 to 120.  Minimally important difference is 5 units  CAT: CAT can identify the health impairment of COPD patients and is better correlated with disease progression.  CAT has a scoring range of zero to 40. The CAT score is classified into four groups of low (less than 10), medium (10 - 20), high (21-30) and very high (31-40) based on the impact level of disease on health status. A CAT score over 10 suggests significant symptoms.  A worsening CAT score could be explained by an exacerbation, poor medication adherence, poor inhaler technique, or progression of COPD or comorbid conditions.  CAT MCID is 2 points  mMRC: mMRC (Modified Medical Research Council) Dyspnea Scale is  used to assess the degree of baseline functional disability in patients of respiratory disease due to dyspnea. No minimal important difference is established. A decrease in score of 1 point or greater is considered a positive change.   Pulmonary Function Assessment:  Pulmonary Function Assessment - 05/26/20 1409      Breath   Shortness of Breath Limiting activity;Yes           Exercise Target Goals: Exercise Program Goal: Individual exercise prescription set using results from initial 6 min walk test and THRR while considering  patient's activity barriers and safety.   Exercise Prescription Goal: Initial exercise prescription builds to 30-45 minutes a day of aerobic activity, 2-3 days per week.  Home exercise guidelines will be given to patient during program as part of exercise prescription that the participant will acknowledge.  Education: Aerobic Exercise: - Group verbal and visual presentation on the components of exercise prescription. Introduces F.I.T.T principle from ACSM for exercise prescriptions.  Reviews F.I.T.T. principles of aerobic exercise including progression. Written material given at graduation.   Education: Resistance Exercise: - Group verbal and visual presentation on the components of exercise prescription. Introduces F.I.T.T principle from ACSM for exercise prescriptions  Reviews F.I.T.T. principles of resistance exercise including progression. Written material given at graduation.    Education: Exercise & Equipment Safety: - Individual verbal instruction and demonstration of equipment use and safety with use of the equipment. Flowsheet Row Pulmonary Rehab from 07/30/2020 in [facility] Cardiac and Pulmonary Rehab  Date 05/26/20  Educator [facility]  Instruction Review Code 1- Verbalizes Understanding      Education: Exercise Physiology & General Exercise Guidelines: - Group verbal and written instruction with models to review the exercise physiology of the cardiovascular  system and associated critical values. Provides general exercise guidelines with specific guidelines to those with heart or lung disease.  Flowsheet Row Pulmonary Rehab from 07/30/2020 in [facility] Cardiac and Pulmonary Rehab  Date 07/02/20  Educator AS  Instruction Review Code 1- Verbalizes Understanding      Education: Flexibility, Balance, Mind/Body Relaxation: - Group verbal and visual presentation with interactive activity on the components of exercise prescription. Introduces F.I.T.T principle from ACSM for exercise prescriptions. Reviews F.I.T.T. principles of flexibility and balance exercise training including progression. Also discusses the mind body connection.  Reviews various relaxation techniques to help reduce and manage stress (i.e. Deep breathing, progressive muscle relaxation, and visualization). Balance handout provided to take home. Written material given at graduation. Flowsheet Row Pulmonary Rehab from  07/30/2020 in [facility] Cardiac and Pulmonary Rehab  Date 07/23/20  Educator AS  Instruction Review Code 1- Verbalizes Understanding      Activity Barriers & Risk Stratification:  Activity Barriers & Cardiac Risk Stratification - 06/02/20 1342      Activity Barriers & Cardiac Risk Stratification   Activity Barriers Arthritis;Deconditioning;Muscular Weakness;Shortness of Breath;Balance Concerns;Joint Problems;Back Problems   chronic knee pain (hurts all the time), spinal deterioration          6 Minute Walk:  6 Minute Walk    Row Name 06/02/20 1340         6 Minute Walk   Phase Initial     Distance 700 feet     Walk Time 4.96 minutes     # of Rest Breaks 4  10 sec, 22 sec, 15 sec, 15 sec     MPH 1.6     METS 1.76     RPE 13     Perceived Dyspnea  1     VO2 Peak 6.17     Symptoms Yes (comment)     Comments SOB, chronic knee pain     Resting HR 76 bpm     Resting BP 126/64     Resting Oxygen Saturation  996 %     Exercise Oxygen Saturation  during 6 min walk 94 %      Max Ex. HR 98 bpm     Max Ex. BP 150/64     2 Minute Post BP 144/66           Interval HR   1 Minute HR 86     2 Minute HR 91     3 Minute HR 89     4 Minute HR 94     5 Minute HR 85     6 Minute HR 98     2 Minute Post HR 75     Interval Heart Rate? Yes           Interval Oxygen   Interval Oxygen? Yes     Baseline Oxygen Saturation % 96 %     1 Minute Oxygen Saturation % 95 %     1 Minute Liters of Oxygen 0 L  Room Air     2 Minute Oxygen Saturation % 94 %     2 Minute Liters of Oxygen 0 L     3 Minute Oxygen Saturation % 94 %     3 Minute Liters of Oxygen 0 L     4 Minute Oxygen Saturation % 95 %     4 Minute Liters of Oxygen 0 L     5 Minute Oxygen Saturation % 94 %     5 Minute Liters of Oxygen 0 L     6 Minute Oxygen Saturation % 94 %     6 Minute Liters of Oxygen 0 L     2 Minute Post Oxygen Saturation % 95 %     2 Minute Post Liters of Oxygen 0 L           Oxygen Initial Assessment:  Oxygen Initial Assessment - 05/26/20 1405      Home Oxygen   Home Oxygen Device None    Sleep Oxygen Prescription None    Home Exercise Oxygen Prescription None    Home Resting Oxygen Prescription None      Initial 6 min Walk   Oxygen Used None      Program Oxygen Prescription  Program Oxygen Prescription None      Intervention   Short Term Goals To learn and demonstrate proper use of respiratory medications;To learn and demonstrate proper pursed lip breathing techniques or other breathing techniques.;To learn and understand importance of maintaining oxygen saturations>88%;To learn and understand importance of monitoring SPO2 with pulse oximeter and demonstrate accurate use of the pulse oximeter.    Long  Term Goals Maintenance of O2 saturations>88%;Verbalizes importance of monitoring SPO2 with pulse oximeter and return demonstration;Exhibits proper breathing techniques, such as pursed lip breathing or other method taught during program session;Compliance with respiratory  medication;Demonstrates proper use of MDI's           Oxygen Re-Evaluation:  Oxygen Re-Evaluation    Row Name 06/04/20 1609 07/30/20 1645           Program Oxygen Prescription   Program Oxygen Prescription None None             Home Oxygen   Home Oxygen Device None None      Sleep Oxygen Prescription None None      Home Exercise Oxygen Prescription None None      Home Resting Oxygen Prescription None None             Goals/Expected Outcomes   Short Term Goals To learn and demonstrate proper pursed lip breathing techniques or other breathing techniques.;To learn and understand importance of maintaining oxygen saturations>88%;To learn and understand importance of monitoring SPO2 with pulse oximeter and demonstrate accurate use of the pulse oximeter. To learn and demonstrate proper pursed lip breathing techniques or other breathing techniques.;To learn and understand importance of maintaining oxygen saturations>88%;To learn and understand importance of monitoring SPO2 with pulse oximeter and demonstrate accurate use of the pulse oximeter.      Long  Term Goals Maintenance of O2 saturations>88%;Verbalizes importance of monitoring SPO2 with pulse oximeter and return demonstration;Exhibits proper breathing techniques, such as pursed lip breathing or other method taught during program session Maintenance of O2 saturations>88%;Verbalizes importance of monitoring SPO2 with pulse oximeter and return demonstration;Exhibits proper breathing techniques, such as pursed lip breathing or other method taught during program session      Comments Reviewed PLB technique with pt.  Talked about how it works and it's importance in maintaining their exercise saturations. Reviewed PLB technique with pt.  Talked about how it works and it's importance in maintaining their exercise saturations.      Goals/Expected Outcomes Short: Become more profiecient at using PLB.   Long: Become independent at using PLB. Short:  Become more profiecient at using PLB.   Long: Become independent at using PLB.             Oxygen Discharge (Final Oxygen Re-Evaluation):  Oxygen Re-Evaluation - 07/30/20 1645      Program Oxygen Prescription   Program Oxygen Prescription None      Home Oxygen   Home Oxygen Device None    Sleep Oxygen Prescription None    Home Exercise Oxygen Prescription None    Home Resting Oxygen Prescription None      Goals/Expected Outcomes   Short Term Goals To learn and demonstrate proper pursed lip breathing techniques or other breathing techniques.;To learn and understand importance of maintaining oxygen saturations>88%;To learn and understand importance of monitoring SPO2 with pulse oximeter and demonstrate accurate use of the pulse oximeter.    Long  Term Goals Maintenance of O2 saturations>88%;Verbalizes importance of monitoring SPO2 with pulse oximeter and return demonstration;Exhibits proper breathing techniques, such as pursed  lip breathing or other method taught during program session    Comments Reviewed PLB technique with pt.  Talked about how it works and it's importance in maintaining their exercise saturations.    Goals/Expected Outcomes Short: Become more profiecient at using PLB.   Long: Become independent at using PLB.           Initial Exercise Prescription:  Initial Exercise Prescription - 06/02/20 1300      Date of Initial Exercise RX and Referring Provider   Date 06/02/20    Referring Provider Hulan Fess MD      Treadmill   MPH 1.4    Grade 0.5    Minutes 15    METs 2.17      Recumbant Bike   Level 1    RPM 50    Watts 8    Minutes 15    METs 1.7      NuStep   Level 1    SPM 80    Minutes 15    METs 1.7      REL-XR   Level 1    Speed 50    Minutes 15    METs 1.7      Prescription Details   Frequency (times per week) 3    Duration Progress to 30 minutes of continuous aerobic without signs/symptoms of physical distress      Intensity    THRR 40-80% of Max Heartrate 107-143    Ratings of Perceived Exertion 11-13    Perceived Dyspnea 0-4      Progression   Progression Continue to progress workloads to maintain intensity without signs/symptoms of physical distress.      Resistance Training   Training Prescription Yes    Weight 3 lb    Reps 10-15           Perform Capillary Blood Glucose checks as needed.  Exercise Prescription Changes:  Exercise Prescription Changes    Row Name 06/02/20 1300 06/10/20 1500 06/25/20 1400 07/10/20 1100 08/07/20 1400     Response to Exercise   Blood Pressure (Admit) 126/64 132/82 124/70 120/68 124/66   Blood Pressure (Exercise) 150/64 146/80 146/66 124/64 138/76   Blood Pressure (Exit) 126/66 124/80 122/68 118/62 124/76   Heart Rate (Admit) 76 bpm 77 bpm 82 bpm 78 bpm 88 bpm   Heart Rate (Exercise) 98 bpm 90 bpm 87 bpm 88 bpm 89 bpm   Heart Rate (Exit) 71 bpm 87 bpm 80 bpm 79 bpm 83 bpm   Oxygen Saturation (Admit) 96 % 95 % 96 % 91 % 96 %   Oxygen Saturation (Exercise) 94 % 97 % 94 % 90 % 95 %   Oxygen Saturation (Exit) 96 % 96 % 97 % 92 % 96 %   Rating of Perceived Exertion (Exercise) 13 13 13 14 11    Perceived Dyspnea (Exercise) 1 0 -- 1 1   Symptoms SOB, chronic knee pain none none none none   Comments walk test results third full day of exercise -- -- --   Duration -- Continue with 30 min of aerobic exercise without signs/symptoms of physical distress. Continue with 30 min of aerobic exercise without signs/symptoms of physical distress. Continue with 30 min of aerobic exercise without signs/symptoms of physical distress. Continue with 30 min of aerobic exercise without signs/symptoms of physical distress.   Intensity -- THRR unchanged THRR unchanged THRR unchanged THRR unchanged     Progression   Progression -- Continue to progress workloads to maintain intensity  without signs/symptoms of physical distress. Continue to progress workloads to maintain intensity without  signs/symptoms of physical distress. Continue to progress workloads to maintain intensity without signs/symptoms of physical distress. Continue to progress workloads to maintain intensity without signs/symptoms of physical distress.   Average METs -- 1.8 3.5 2.25 1.9     Resistance Training   Training Prescription -- Yes Yes Yes Yes   Weight -- 3 lb 3 lb 4 lb 4 lb   Reps -- 10-15 10-15 10-15 10-15     Interval Training   Interval Training -- No No No No     Recumbant Bike   Level -- 1 -- -- --   Minutes -- 15 -- -- --     NuStep   Level -- 1 2 2 2    SPM -- -- 80 80 --   Minutes -- 15 15 15 30    METs -- 1.6 3.5 2.5 1.9     Biostep-RELP   Level -- 1 -- 2 --   Minutes -- 15 -- 15 --   METs -- 2 -- 2 --     Home Exercise Plan   Plans to continue exercise at -- -- -- -- Home (comment)  walking, [employer]   Frequency -- -- -- -- Add 2 additional days to program exercise sessions.   Initial Home Exercises Provided -- -- -- -- 06/30/20          Exercise Comments:  Exercise Comments    Row Name 06/04/20 1609           Exercise Comments First full day of exercise!  Patient was oriented to gym and equipment including functions, settings, policies, and procedures.  Patient's individual exercise prescription and treatment plan were reviewed.  All starting workloads were established based on the results of the 6 minute walk test done at initial orientation visit.  The plan for exercise progression was also introduced and progression will be customized based on patient's performance and goals.              Exercise Goals and Review:  Exercise Goals    Row Name 06/02/20 1348             Exercise Goals   Increase Physical Activity Yes       Intervention Provide advice, education, support and counseling about physical activity/exercise needs.;Develop an individualized exercise prescription for aerobic and resistive training based on initial evaluation findings, risk stratification,  comorbidities and participant's personal goals.       Expected Outcomes Long Term: Add in home exercise to make exercise part of routine and to increase amount of physical activity.;Short Term: Attend rehab on a regular basis to increase amount of physical activity.;Long Term: Exercising regularly at least 3-5 days a week.       Increase Strength and Stamina Yes       Intervention Provide advice, education, support and counseling about physical activity/exercise needs.;Develop an individualized exercise prescription for aerobic and resistive training based on initial evaluation findings, risk stratification, comorbidities and participant's personal goals.       Expected Outcomes Short Term: Increase workloads from initial exercise prescription for resistance, speed, and METs.;Short Term: Perform resistance training exercises routinely during rehab and add in resistance training at home;Long Term: Improve cardiorespiratory fitness, muscular endurance and strength as measured by increased METs and functional capacity (6MWT)       Able to understand and use rate of perceived exertion (RPE) scale Yes  Intervention Provide education and explanation on how to use RPE scale       Expected Outcomes Short Term: Able to use RPE daily in rehab to express subjective intensity level;Long Term:  Able to use RPE to guide intensity level when exercising independently       Able to understand and use Dyspnea scale Yes       Intervention Provide education and explanation on how to use Dyspnea scale       Expected Outcomes Short Term: Able to use Dyspnea scale daily in rehab to express subjective sense of shortness of breath during exertion;Long Term: Able to use Dyspnea scale to guide intensity level when exercising independently       Knowledge and understanding of Target Heart Rate Range (THRR) Yes       Intervention Provide education and explanation of THRR including how the numbers were predicted and where they  are located for reference       Expected Outcomes Short Term: Able to state/look up THRR;Short Term: Able to use daily as guideline for intensity in rehab;Long Term: Able to use THRR to govern intensity when exercising independently       Able to check pulse independently Yes       Intervention Provide education and demonstration on how to check pulse in carotid and radial arteries.;Review the importance of being able to check your own pulse for safety during independent exercise       Expected Outcomes Short Term: Able to explain why pulse checking is important during independent exercise;Long Term: Able to check pulse independently and accurately       Understanding of Exercise Prescription Yes       Intervention Provide education, explanation, and written materials on patient's individual exercise prescription       Expected Outcomes Short Term: Able to explain program exercise prescription;Long Term: Able to explain home exercise prescription to exercise independently              Exercise Goals Re-Evaluation :  Exercise Goals Re-Evaluation    Row Name 06/04/20 1609 06/10/20 1544 06/25/20 1412 07/10/20 1112 07/30/20 1622     Exercise Goal Re-Evaluation   Exercise Goals Review Increase Physical Activity;Able to understand and use rate of perceived exertion (RPE) scale;Knowledge and understanding of Target Heart Rate Range (THRR);Understanding of Exercise Prescription;Able to check pulse independently;Able to understand and use Dyspnea scale;Increase Strength and Stamina Increase Physical Activity;Increase Strength and Stamina;Understanding of Exercise Prescription Increase Physical Activity;Increase Strength and Stamina Increase Physical Activity;Increase Strength and Stamina Increase Physical Activity;Increase Strength and Stamina   Comments Reviewed RPE and dyspnea scales, THR and program prescription with pt today.  Pt voiced understanding and was given a copy of goals to take home. [name] is  off to a good start in rehab.  She has completed her first three full days of rehab.  We will start to increase her workloads as the BioStep was an RPE of 8 on her last visit.  We will continue to monitor her progress. [name] has increased to level 2 and 3.5 METs on NS.  Oxygen has stayed in mid to upper 90s during exercise.  Staff will monitor progress. [name] has increased to 4 lb for strength work.  She does not reach THR range.  Staff will encourage her to work towards that and be consistent with exercise in class and at home [name] is not yet doing exercise outside program sessions.  She feels like that is enough for now.  Staff will monitor progress.   Expected Outcomes Short: Use RPE daily to regulate intensity. Long: Follow program prescription in THR. Short: Increase workloads Long: Continue to follow program prescription Short: increase workloads Long: continue to improve METs Short: reach THR range Long: build overall stamina Short: get back to consistent attendance Long:  improve overall stamina   Row Name 08/07/20 1434             Exercise Goal Re-Evaluation   Exercise Goals Review Increase Physical Activity;Increase Strength and Stamina;Understanding of Exercise Prescription       Comments [name] has been doing well in class.  She had missed two days this week with work and appointment.  She is doing 30 min on the NuStep.  We will continue to montior her progress.       Expected Outcomes Short: get back to consistent attendance Long:  improve overall stamina              Discharge Exercise Prescription (Final Exercise Prescription Changes):  Exercise Prescription Changes - 08/07/20 1400      Response to Exercise   Blood Pressure (Admit) 124/66    Blood Pressure (Exercise) 138/76    Blood Pressure (Exit) 124/76    Heart Rate (Admit) 88 bpm    Heart Rate (Exercise) 89 bpm    Heart Rate (Exit) 83 bpm    Oxygen Saturation (Admit) 96 %    Oxygen Saturation (Exercise) 95 %    Oxygen  Saturation (Exit) 96 %    Rating of Perceived Exertion (Exercise) 11    Perceived Dyspnea (Exercise) 1    Symptoms none    Duration Continue with 30 min of aerobic exercise without signs/symptoms of physical distress.    Intensity THRR unchanged      Progression   Progression Continue to progress workloads to maintain intensity without signs/symptoms of physical distress.    Average METs 1.9      Resistance Training   Training Prescription Yes    Weight 4 lb    Reps 10-15      Interval Training   Interval Training No      NuStep   Level 2    Minutes 30    METs 1.9      Home Exercise Plan   Plans to continue exercise at Home (comment)   walking, [employer]   Frequency Add 2 additional days to program exercise sessions.    Initial Home Exercises Provided 06/30/20           Nutrition:  Target Goals: Understanding of nutrition guidelines, daily intake of sodium 1500mg , cholesterol 200mg , calories 30% from fat and 7% or less from saturated fats, daily to have 5 or more servings of fruits and vegetables.  Education: All About Nutrition: -Group instruction provided by verbal, written material, interactive activities, discussions, models, and posters to present general guidelines for heart healthy nutrition including fat, fiber, MyPlate, the role of sodium in heart healthy nutrition, utilization of the nutrition label, and utilization of this knowledge for meal planning. Follow up email sent as well. Written material given at graduation. Flowsheet Row Pulmonary Rehab from 07/30/2020 in [facility] Cardiac and Pulmonary Rehab  Date 07/30/20  Educator [facility]  Instruction Review Code 1- Verbalizes Understanding      Biometrics:  Pre Biometrics - 06/02/20 1348      Pre Biometrics   Height 5' 6.1" (1.679 m)    Weight 258 lb 1.6 oz (117.1 kg)    BMI (Calculated) 41.53  Single Leg Stand 1.2 seconds            Nutrition Therapy Plan and Nutrition Goals:  Nutrition Therapy & Goals -  06/30/20 1612      Nutrition Therapy   Diet Heart healthy, low Na, diabetes friendly eating    Protein (specify units) 90g    Fiber 25 grams    Whole Grain Foods 3 servings    Saturated Fats 12 max. grams    Fruits and Vegetables 8 servings/day    Sodium 1.5 grams      Personal Nutrition Goals   Nutrition Goal ST: snack of fruit add cheese (has low fat string cheese), add greek yogurt to meal of just fruit over the summer, eat meals and snacks consistently LT: eat meals and snacks with protein, fat, and fiber.    Comments [name] reports stress eating due to her family and friends going through hard times - cancer, tumors; her husband passed away from cancer when he was 58. 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 She has two sons. 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 She gave up sugar for Malena Edman except for her starbuscks coffee. She can sometimes eat distractedly. Her diabetes is undercontrol and has sometimes been lower than needed for exercise at rehab. her bathroom habits are hard, soft, then diarrhea each day - 5 days in between - has been like this since childhood. She has no pain associated, but she can get uncomfortable and nauseous. She will drink something that helps her go to the bathroom. L: at school D: not past 6pm - weekends are a bit different. She reports it doesn't take much to keep her full. She reports drinking lots of water. During the week L: her mom makes her lunch - oatbread - peanut butter and jelly or tuna. sometimes leftover. Some fruit. D: her mother cooks day - her mother cooks chicken and sometimes pork chops, salmon, or shrimp - limits her red meat.  Her mom uses canola oil - her mom bakes it. She will have protein will salad with flavored rice with greens or peas. Over the summer sometimes she will just have fruit for dinner. She doesn't  care much for bread. Discussed heart healthy eating and diabetes friendly eating.      Intervention Plan   Intervention Prescribe, educate and counsel regarding individualized specific dietary modifications aiming towards targeted core components such as weight, hypertension, lipid management, diabetes, heart failure and other comorbidities.;Nutrition handout(s) given to patient.    Expected Outcomes Short Term Goal: Understand basic principles of dietary content, such as calories, fat, sodium, cholesterol and nutrients.;Long Term Goal: Adherence to prescribed nutrition plan.;Short Term Goal: A plan has been developed with personal nutrition goals set during dietitian appointment.           Nutrition Assessments:  MEDIFICTS Score Key:  ?70 Need to make dietary changes   40-70 Heart Healthy Diet  ? 40 Therapeutic Level Cholesterol Diet  Flowsheet Row Pulmonary Rehab from 06/02/2020 in [facility] Cardiac and Pulmonary Rehab  Picture Your Plate Total Score on Admission 76     Picture Your Plate Scores:  <56 Unhealthy dietary pattern with much room for improvement.  41-50 Dietary pattern unlikely to meet recommendations for good health and room for improvement.  51-60 More healthful dietary pattern, with some room for improvement.   >  60 Healthy dietary pattern, although there may be some specific behaviors that could be improved.   Nutrition Goals Re-Evaluation:  Nutrition Goals Re-Evaluation    [place] Name 07/30/20 1624             Goals   Comment [name] eats twice a day.  She eats lunch and dinner.  She says it doesnt bother her to not eat breakfast - lunch at school is at 11:25 am.  She uses PAM, not butter and doesnt add salt if she goes out.  She eats a burger occasionally.  She does eat every kind of vegetable.  She eats a good variety of fruit.       Expected Outcome Short: continue to drink lots of water  Long: maintain heart healthy diet              Nutrition Goals  Discharge (Final Nutrition Goals Re-Evaluation):  Nutrition Goals Re-Evaluation - 07/30/20 1624      Goals   Comment [name] eats twice a day.  She eats lunch and dinner.  She says it doesnt bother her to not eat breakfast - lunch at school is at 11:25 am.  She uses PAM, not butter and doesnt add salt if she goes out.  She eats a burger occasionally.  She does eat every kind of vegetable.  She eats a good variety of fruit.    Expected Outcome Short: continue to drink lots of water  Long: maintain heart healthy diet           Psychosocial: Target Goals: Acknowledge presence or absence of significant depression and/or stress, maximize coping skills, provide positive support system. Participant is able to verbalize types and ability to use techniques and skills needed for reducing stress and depression.   Education: Stress, Anxiety, and Depression - Group verbal and visual presentation to define topics covered.  Reviews how body is impacted by stress, anxiety, and depression.  Also discusses healthy ways to reduce stress and to treat/manage anxiety and depression.  Written material given at graduation.   Education: Sleep Hygiene -Provides group verbal and written instruction about how sleep can affect your health.  Define sleep hygiene, discuss sleep cycles and impact of sleep habits. Review good sleep hygiene tips.    Initial Review & Psychosocial Screening:  Initial Psych Review & Screening - 05/26/20 1413      Initial Review   Current issues with None Identified;Current Anxiety/Panic;History of Depression      Family Dynamics   Good Support System? Yes    Comments She has a son, aunt and lives with her 60 year old mom. Her two sisters also live in town. She has a good outlook on her health.      Barriers   Psychosocial barriers to participate in program The patient should benefit from training in stress management and relaxation.      Screening Interventions   Interventions  Encouraged to exercise;Program counselor consult;To provide support and resources with identified psychosocial needs;Provide feedback about the scores to participant    Expected Outcomes Short Term goal: Utilizing psychosocial counselor, staff and physician to assist with identification of specific Stressors or current issues interfering with healing process. Setting desired goal for each stressor or current issue identified.;Short Term goal: Identification and review with participant of any Quality of Life or Depression concerns found by scoring the questionnaire.;Long Term Goal: Stressors or current issues are controlled or eliminated.;Long Term goal: The participant improves quality of Life and PHQ9 Scores as  seen by post scores and/or verbalization of changes           Quality of Life Scores:  Scores of 19 and below usually indicate a poorer quality of life in these areas.  A difference of  2-3 points is a clinically meaningful difference.  A difference of 2-3 points in the total score of the Quality of Life Index has been associated with significant improvement in overall quality of life, self-image, physical symptoms, and general health in studies assessing change in quality of life.  PHQ-9: Recent Review Flowsheet Data    Depression screen [facility] 2/9 06/02/2020   Decreased Interest 3   Down, Depressed, Hopeless 0   PHQ - 2 Score 3   Altered sleeping 3   Tired, decreased energy 3   Change in appetite 3   Feeling bad or failure about yourself  0   Trouble concentrating 3   Moving slowly or fidgety/restless 0   Suicidal thoughts 0   PHQ-9 Score 15   Difficult doing work/chores Somewhat difficult     Interpretation of Total Score  Total Score Depression Severity:  1-4 = Minimal depression, 5-9 = Mild depression, 10-14 = Moderate depression, 15-19 = Moderately severe depression, 20-27 = Severe depression   Psychosocial Evaluation and Intervention:  Psychosocial Evaluation - 05/26/20  1419      Psychosocial Evaluation & Interventions   Interventions Encouraged to exercise with the program and follow exercise prescription;Relaxation education;Stress management education    Comments She has a son, aunt and lives with her 40 year old mom. Her two sisters also live in town. She has a good outlook on her health.    Expected Outcomes Short: Exercise regularly to support mental health and notify staff of any changes. Long: maintain mental health and well being through teaching of rehab or prescribed medications independently.    Continue Psychosocial Services  Follow up required by staff           Psychosocial Re-Evaluation:   Psychosocial Discharge (Final Psychosocial Re-Evaluation):   Education: Education Goals: Education classes will be provided on a weekly basis, covering required topics. Participant will state understanding/return demonstration of topics presented.  Learning Barriers/Preferences:  Learning Barriers/Preferences - 05/26/20 1409      Learning Barriers/Preferences   Learning Barriers None    Learning Preferences None           General Pulmonary Education Topics:  Infection Prevention: - Provides verbal and written material to individual with discussion of infection control including proper hand washing and proper equipment cleaning during exercise session. Flowsheet Row Pulmonary Rehab from 07/30/2020 in [facility] Cardiac and Pulmonary Rehab  Date 05/26/20  Educator [facility]  Instruction Review Code 1- Verbalizes Understanding      Falls Prevention: - Provides verbal and written material to individual with discussion of falls prevention and safety. Flowsheet Row Pulmonary Rehab from 07/30/2020 in [facility] Cardiac and Pulmonary Rehab  Date 05/26/20  Educator [facility]  Instruction Review Code 1- Verbalizes Understanding      Chronic Lung Disease Review: - Group verbal instruction with posters, models, PowerPoint presentations and videos,  to review new  updates, new respiratory medications, new advancements in procedures and treatments. Providing information on websites and "800" numbers for continued self-education. Includes information about supplement oxygen, available portable oxygen systems, continuous and intermittent flow rates, oxygen safety, concentrators, and Medicare reimbursement for oxygen. Explanation of Pulmonary Drugs, including class, frequency, complications, importance of spacers, rinsing mouth after steroid MDI's, and proper cleaning methods  for nebulizers. Review of basic lung anatomy and physiology related to function, structure, and complications of lung disease. Review of risk factors. Discussion about methods for diagnosing sleep apnea and types of masks and machines for OSA. Includes a review of the use of types of environmental controls: home humidity, furnaces, filters, dust mite/pet prevention, HEPA vacuums. Discussion about weather changes, air quality and the benefits of nasal washing. Instruction on Warning signs, infection symptoms, calling MD promptly, preventive modes, and value of vaccinations. Review of effective airway clearance, coughing and/or vibration techniques. Emphasizing that all should Create an Action Plan. Written material given at graduation. Flowsheet Row Pulmonary Rehab from 07/30/2020 in [facility] Cardiac and Pulmonary Rehab  Education need identified 06/02/20  Date 06/18/20  Educator [facility]  Instruction Review Code 1- Verbalizes Understanding      AED/CPR: - Group verbal and written instruction with the use of models to demonstrate the basic use of the AED with the basic ABC's of resuscitation.    Anatomy and Cardiac Procedures: - Group verbal and visual presentation and models provide information about basic cardiac anatomy and function. Reviews the testing methods done to diagnose heart disease and the outcomes of the test results. Describes the treatment choices: Medical Management, Angioplasty, or  Coronary Bypass Surgery for treating various heart conditions including Myocardial Infarction, Angina, Valve Disease, and Cardiac Arrhythmias.  Written material given at graduation.   Medication Safety: - Group verbal and visual instruction to review commonly prescribed medications for heart and lung disease. Reviews the medication, class of the drug, and side effects. Includes the steps to properly store meds and maintain the prescription regimen.  Written material given at graduation.   Other: -Provides group and verbal instruction on various topics (see comments)   Knowledge Questionnaire Score:  Knowledge Questionnaire Score - 06/02/20 1349      Knowledge Questionnaire Score   Pre Score 16/18 Education Focus: O2 safety            Core Components/Risk Factors/Patient Goals at Admission:  Personal Goals and Risk Factors at Admission - 06/02/20 1350      Core Components/Risk Factors/Patient Goals on Admission    Weight Management Yes;Weight Loss;Obesity    Intervention Weight Management: Develop a combined nutrition and exercise program designed to reach desired caloric intake, while maintaining appropriate intake of nutrient and fiber, sodium and fats, and appropriate energy expenditure required for the weight goal.;Weight Management: Provide education and appropriate resources to help participant work on and attain dietary goals.;Weight Management/Obesity: Establish reasonable short term and long term weight goals.;Obesity: Provide education and appropriate resources to help participant work on and attain dietary goals.    Admit Weight 258 lb 1.6 oz (117.1 kg)    Goal Weight: Short Term 250 lb (113.4 kg)    Goal Weight: Long Term 240 lb (108.9 kg)    Expected Outcomes Short Term: Continue to assess and modify interventions until short term weight is achieved;Long Term: Adherence to nutrition and physical activity/exercise program aimed toward attainment of established weight  goal;Weight Loss: Understanding of general recommendations for a balanced deficit meal plan, which promotes 1-2 lb weight loss per week and includes a negative energy balance of [phone] kcal/d;Understanding recommendations for meals to include 15-35% energy as protein, 25-35% energy from fat, 35-60% energy from carbohydrates, less than 200mg  of dietary cholesterol, 20-35 gm of total fiber daily;Understanding of distribution of calorie intake throughout the day with the consumption of 4-5 meals/snacks    Improve shortness of breath with ADL's  Yes    Intervention Provide education, individualized exercise plan and daily activity instruction to help decrease symptoms of SOB with activities of daily living.    Expected Outcomes Short Term: Improve cardiorespiratory fitness to achieve a reduction of symptoms when performing ADLs;Long Term: Be able to perform more ADLs without symptoms or delay the onset of symptoms    Diabetes Yes    Intervention Provide education about signs/symptoms and action to take for hypo/hyperglycemia.;Provide education about proper nutrition, including hydration, and aerobic/resistive exercise prescription along with prescribed medications to achieve blood glucose in normal ranges: Fasting glucose 65-99 mg/dL    Expected Outcomes Short Term: Participant verbalizes understanding of the signs/symptoms and immediate care of hyper/hypoglycemia, proper foot care and importance of medication, aerobic/resistive exercise and nutrition plan for blood glucose control.;Long Term: Attainment of HbA1C < 7%.    Hypertension Yes    Intervention Provide education on lifestyle modifcations including regular physical activity/exercise, weight management, moderate sodium restriction and increased consumption of fresh fruit, vegetables, and low fat dairy, alcohol moderation, and smoking cessation.;Monitor prescription use compliance.    Expected Outcomes Short Term: Continued assessment and intervention  until BP is < 140/46mm HG in hypertensive participants. < 130/45mm HG in hypertensive participants with diabetes, heart failure or chronic kidney disease.;Long Term: Maintenance of blood pressure at goal levels.           Education:Diabetes - Individual verbal and written instruction to review signs/symptoms of diabetes, desired ranges of glucose level fasting, after meals and with exercise. Acknowledge that pre and post exercise glucose checks will be done for 3 sessions at entry of program. Flowsheet Row Pulmonary Rehab from 07/30/2020 in [facility] Cardiac and Pulmonary Rehab  Date 05/26/20  Educator [facility]  Instruction Review Code 1- Verbalizes Understanding      Know Your Numbers and Heart Failure: - Group verbal and visual instruction to discuss disease risk factors for cardiac and pulmonary disease and treatment options.  Reviews associated critical values for Overweight/Obesity, Hypertension, Cholesterol, and Diabetes.  Discusses basics of heart failure: signs/symptoms and treatments.  Introduces Heart Failure Zone chart for action plan for heart failure.  Written material given at graduation. Flowsheet Row Pulmonary Rehab from 07/30/2020 in [facility] Cardiac and Pulmonary Rehab  Date 06/11/20  Educator [facility]  Instruction Review Code 1- Verbalizes Understanding      Core Components/Risk Factors/Patient Goals Review:   Goals and Risk Factor Review    Row Name 07/30/20 1617 07/30/20 1629           Core Components/Risk Factors/Patient Goals Review   Personal Goals Review Diabetes;Hypertension --      Review [name] has been out sick (not  Covid).  She feels she is still recovering from being sick.  She has been very fatigued.  She has not checked sugar at home.  Her last a1 c was in March and her Dr did not say anything was wrong.  She did take prednisone while she was sick.  (not currently - only for 5 days) She is having knee replacement this summer.      Expected Outcomes -- Short: get back to  consistent attendance Long:  manage risk factors             Core Components/Risk Factors/Patient Goals at Discharge (Final Review):   Goals and Risk Factor Review - 07/30/20 1629      Core Components/Risk Factors/Patient Goals Review   Review She is having knee replacement this summer.    Expected Outcomes Short:  get back to consistent attendance Long:  manage risk factors           ITP Comments:  ITP Comments    Row Name 05/26/20 1405 06/02/20 1340 06/04/20 1609 06/11/20 0719 07/08/20 0805   ITP Comments Virtual Visit completed. Patient informed on EP and RD appointment and 6 Minute walk test. Patient also informed of patient health questionnaires on My Chart. Patient Verbalizes understanding. Visit diagnosis can be found in [facility] 05/14/2020. Completed 6MWT and gym orientation. Initial ITP created and sent for review to Dr. Emily Filbert, Medical Director. First full day of exercise!  Patient was oriented to gym and equipment including functions, settings, policies, and procedures.  Patient's individual exercise prescription and treatment plan were reviewed.  All starting workloads were established based on the results of the 6 minute walk test done at initial orientation visit.  The plan for exercise progression was also introduced and progression will be customized based on patient's performance and goals. 30 Day review completed. Medical Director ITP review done, changes made as directed, and signed approval by Medical Director. Unable to complete goals this cycle - last couple of sessions patient came to rehab late and did not attend 4/18. We will discuss goals when next review cycle begins.   Row Name 07/09/20 [phone] 07/23/20 0948 08/06/20 0629 08/20/20 1505 08/22/20 1033   ITP Comments 30 Day review completed. Medical Director ITP review done, changes made as directed, and signed approval by Medical Director. Doyne Keel has not attended since last review. 30 Day review completed. Medical Director  ITP review done, changes made as directed, and signed approval by Medical Director.   only 2 visits in [place] Pt left [place] a message about staying out until her knee surgery with URI.  Called back to talk and left message.  We may need to go ahead and discharge as she will be out at least 6 weeks post knee. [name] reports she would be out of rehab until after she recovers from her knee surgery (6 week recovery at minimum); we will discharge her at this time.          Comments: Discharge ITP

## 2020-08-28 ENCOUNTER — Other Ambulatory Visit: Payer: Self-pay

## 2020-08-28 ENCOUNTER — Other Ambulatory Visit
Admission: RE | Admit: 2020-08-28 | Discharge: 2020-08-28 | Disposition: A | Discharge status: Home / Self Care | Payer: Medicaid Other | Source: Ambulatory Visit | Attending: Orthopedic Surgery | Admitting: Orthopedic Surgery

## 2020-08-28 DIAGNOSIS — Z20822 Contact with and (suspected) exposure to covid-19: Secondary | ICD-10-CM | POA: Insufficient documentation

## 2020-08-28 DIAGNOSIS — Z01811 Encounter for preprocedural respiratory examination: Secondary | ICD-10-CM

## 2020-08-28 DIAGNOSIS — Z01818 Encounter for other preprocedural examination: Secondary | ICD-10-CM | POA: Diagnosis present

## 2020-08-28 HISTORY — DX: Other specified postprocedural states: Z87.442

## 2020-08-28 HISTORY — DX: Personal history of urinary calculi: Z98.890

## 2020-08-28 LAB — CBC WITH DIFFERENTIAL/PLATELET
Abs Immature Granulocytes: 0.04 10*3/uL (ref 0.00–0.07)
Basophils Absolute: 0.1 10*3/uL (ref 0.0–0.1)
Basophils Relative: 1 %
Eosinophils Absolute: 0.2 10*3/uL (ref 0.0–0.5)
Eosinophils Relative: 2 %
HCT: 38.3 % (ref 36.0–46.0)
Hemoglobin: 12.4 g/dL (ref 12.0–15.0)
Immature Granulocytes: 0 %
Lymphocytes Relative: 35 %
Lymphs Abs: 3.4 10*3/uL (ref 0.7–4.0)
MCH: 29.5 pg (ref 26.0–34.0)
MCHC: 32.4 g/dL (ref 30.0–36.0)
MCV: 91 fL (ref 80.0–100.0)
Monocytes Absolute: 1 10*3/uL (ref 0.1–1.0)
Monocytes Relative: 10 %
Neutro Abs: 4.9 10*3/uL (ref 1.7–7.7)
Neutrophils Relative %: 52 %
Platelets: 263 10*3/uL (ref 150–400)
RBC: 4.21 MIL/uL (ref 3.87–5.11)
RDW: 14.4 % (ref 11.5–15.5)
WBC: 9.6 10*3/uL (ref 4.0–10.5)
nRBC: 0 % (ref 0.0–0.2)

## 2020-08-28 LAB — BASIC METABOLIC PANEL
Anion gap: 8 (ref 5–15)
BUN: 17 mg/dL (ref 6–20)
CO2: 28 mmol/L (ref 22–32)
Calcium: 8.6 mg/dL — ABNORMAL LOW (ref 8.9–10.3)
Chloride: 102 mmol/L (ref 98–111)
Creatinine, Ser: 0.77 mg/dL (ref 0.44–1.00)
GFR, Estimated: >60 mL/min (ref >60)
Glucose, Bld: 85 mg/dL (ref 70–99)
Potassium: 3.2 mmol/L — ABNORMAL LOW (ref 3.5–5.1)
Sodium: 138 mmol/L (ref 135–145)

## 2020-08-28 LAB — URINALYSIS, COMPLETE (UACMP) WITH MICROSCOPIC
Bilirubin Urine: NEGATIVE
Glucose, UA: NEGATIVE mg/dL
Hgb urine dipstick: NEGATIVE
Ketones, ur: NEGATIVE mg/dL
Nitrite: NEGATIVE
Protein, ur: NEGATIVE mg/dL
Specific Gravity, Urine: 1.02 (ref 1.005–1.030)
pH: 5 (ref 5.0–8.0)

## 2020-08-28 LAB — SURGICAL PCR SCREEN
MRSA, PCR: NEGATIVE
Staphylococcus aureus: NEGATIVE

## 2020-08-28 LAB — PROTIME-INR
INR: 0.9 (ref 0.8–1.2)
Prothrombin Time: 12.4 seconds (ref 11.4–15.2)

## 2020-08-28 LAB — APTT: aPTT: 32 seconds (ref 24–36)

## 2020-08-28 NOTE — Progress Notes (Signed)
  [facility] Perioperative Services: Pre-Admission/Anesthesia Testing  Abnormal Lab Notification   Date: 08/28/20  Name: Katherine Estes MRN:   [id number]  Re: Abnormal labs noted during PAT appointment   Provider(s) Notified: Thornton Park, MD Notification mode: Routed and/or faxed via [place] LAB VALUE(S): Lab Results  Component Value Date   K 3.2 (L) 08/28/2020   Lab Results  Component Value Date   COLORURINE YELLOW (A) 08/28/2020   APPEARANCEUR HAZY (A) 08/28/2020   LABSPEC 1.020 08/28/2020   PHURINE 5.0 08/28/2020   GLUCOSEU NEGATIVE 08/28/2020   HGBUR NEGATIVE 08/28/2020   BILIRUBINUR NEGATIVE 08/28/2020   KETONESUR NEGATIVE 08/28/2020   PROTEINUR NEGATIVE 08/28/2020   UROBILINOGEN 0.2 09/10/2012   NITRITE NEGATIVE 08/28/2020   LEUKOCYTESUR MODERATE (A) 08/28/2020   EPIU 6-10 08/28/2020   WBCU 11-20 08/28/2020   RBCU 6-10 08/28/2020   BACTERIA RARE (A) 08/28/2020    Impression and Plan:  Patient is scheduled for a RIGHT TOTAL KNEE ARTHROPLASTY (Right: Knee) on 09/02/2020.   K+ level slightly decreased at 3.2 mmol/L.  Patient is on daily thiazide diuretic therapy (HCTZ 25 mg).   Already taking Klor-Con 10 mEq daily.  Will forward to for review and consideration of need for further optimization prior to surgery.   Order placed to have K+ level rechecked on the day of patient's procedure to ensure that level is at a safe level to proceed with planned procedural/anesthetic course.  UA performed in PAT concerning for infection.  No leukocytosis noted on CBC; WBC 9.6 K/uL.  Renal function normal. Estimated Creatinine Clearance: 98.3 mL/min (by C-G formula based on SCr of 0.77 mg/dL). Urine C&S added to assess for pathogenically significant growth. Will forward UA, and subsequent C&S results, to attending surgeon for review and Tx as deemed appropriate.   This is a Community education officer; no formal response is required.  Honor Loh,  MSN, APRN, FNP-C, CEN [facility]  Peri-operative Services Nurse Practitioner Phone: [phone] 08/28/20 2:20 PM  NOTE: This note has been prepared using Dragon dictation software. Despite my best ability to proofread, there is always the potential that unintentional transcriptional errors may still occur from this process.

## 2020-08-28 NOTE — Progress Notes (Signed)
  Perioperative Services Pre-Admission/Anesthesia Testing   Date: 08/28/20 Name: Katherine Estes MRN:   [id number]  Re: Consideration of preoperative prophylactic antibiotic change   Request sent to: Thornton Park, MD (routed and/or faxed via [facility])  Planned Surgical Procedure(s):    Case: [id number] Date/Time: 09/02/20 0730   Procedure: RIGHT TOTAL KNEE ARTHROPLASTY (Right: Knee)   Anesthesia type: Choice   Pre-op diagnosis: Right Knee Osteoarthritis   Location: [place] 02 / [place] ORS FOR ANESTHESIA GROUP   Surgeons: Thornton Park, MD     Notes: Patient has a documented allergy to PCN  Advising that PCN has caused her to experience low severity urticarial rash in the past.   Screened as appropriate for cephalosporin use during medication reconciliation No immediate angioedema, dysphagia, SOB, anaphylaxis symptoms. No severe rash involving mucous membranes or skin necrosis. No hospital admissions related to side effects of PCN/cephalosporin use.  No documented reaction to PCN or cephalosporin in the last 10 years.  Request:  As an evidence based approach to reducing the rate of incidence for post-operative SSI and the development of MDROs, could an agent with narrower coverage for preoperative prophylaxis in this patient's upcoming surgical course be considered?   Currently ordered preoperative prophylactic ABX:  clindamycin + cefazolin .   Specifically requesting change to cephalosporin (CEFAZOLIN) monotherapy.   Please communicate decision with me and I will change the orders in Epic as per your direction.   Things to consider: Many patients report that they were "allergic" to PCN earlier in life, however this does not translate into a true lifelong allergy. Patients can lose sensitivity to specific IgE antibodies over time if PCN is avoided (Kleris & Lugar, 2019).  Up to 10% of the adult population and 15% of hospitalized patients report an allergy to PCN, however  clinical studies suggest that 90% of those reporting an allergy can tolerate PCN antibiotics (Kleris & Lugar, 2019).  Cross-sensitivity between PCN and cephalosporins has been documented as being as high as 10%, however this estimation included data believed to have been collected in a setting where there was contamination. Newer data suggests that the prevalence of cross-sensitivity between PCN and cephalosporins is actually estimated to be closer to 1% (Hermanides et al., 2018).   Patients labeled as PCN allergic, whether they are truly allergic or not, have been found to have inferior outcomes in terms of rates of serious infection, and these patients tend to have longer hospital stays (Otis, 2019).  Treatment related secondary infections, such as Clostridioides difficile, have been linked to the improper use of broad spectrum antibiotics in patients improperly labeled as PCN allergic (Kleris & Lugar, 2019).  Anaphylaxis from cephalosporins is rare and the evidence suggests that there is no increased risk of an anaphylactic type reaction when cephalosporins are used in a PCN allergic patient (Pichichero, 2006).  Citations: Hermanides J, Lemkes BA, Prins Pearla Dubonnet MW, Terreehorst I. Presumed ?-Lactam Allergy and Cross-reactivity in the Operating Theater: A Practical Approach. Anesthesiology. 2018 Aug;129(2):335-342. doi: 10.1097/ALN.0000000000002252. PMID: 73532992.  Kleris, [place]., & Lugar, P. L. (2019). Things We Do For No Reason: Failing to Question a Penicillin Allergy History. Journal of hospital medicine, 14(10), 401-672-2718. Advance online publication. https://www.wallace-middleton.info/  Pichichero, M. E. (2006). Cephalosporins can be prescribed safely for penicillin-allergic patients. Journal of family medicine, 55(2), 106-112. Accessed: https://cdn.mdedge.com/files/s61fs-public/Document/September-2017/5502JFP_AppliedEvidence1.pdf   Honor Loh, MSN, APRN, FNP-C, CEN [facility]  Peri-operative Services Nurse Practitioner FAX: [phone] 08/28/20 10:30 AM

## 2020-08-28 NOTE — Progress Notes (Addendum)
  Perioperative Services Pre-Admission/Anesthesia Testing     Date: 08/28/20  Name: Katherine Estes MRN:   [id number]  Re: Change in [place] for upcoming surgery   Case: [id number] Date/Time: 09/02/20 0730   Procedure: RIGHT TOTAL KNEE ARTHROPLASTY (Right: Knee)   Anesthesia type: Choice   Pre-op diagnosis: Right Knee Osteoarthritis   Location: [facility] OR ROOM 02 / [place] ORS FOR ANESTHESIA GROUP   Surgeons: Thornton Park, MD     Primary attending surgeon's office was consulted regarding consideration of therapeutic change in antimicrobial agents being used for preoperative prophylaxis in this patient's upcoming surgical case. Of note, patient currently has dual coverage ordered (clindamycin + cefazolin) prior to her procedure. The following response was received from the surgeon's office:  Plan:  No changes were made to the pre-operative prophylactic antibiotic regimen as per MD direction.   Honor Loh, MSN, APRN, FNP-C, CEN [facility]  Peri-operative Services Nurse Practitioner Phone: [phone] 08/28/20 11:13 AM  NOTE: This note has been prepared using Dragon dictation software. Despite my best ability to proofread, there is always the potential that unintentional transcriptional errors may still occur from this process.

## 2020-08-28 NOTE — Patient Instructions (Addendum)
Your procedure is scheduled on: 09/02/20 - Tuesday Report to the Registration Desk on the 1st floor of the [facility]. To find out your arrival time, please call [phone] between 1PM - 3PM on: 09/01/20 Monday - Report To [facility] 08/29/20 - from 8am - 12:00 for Covid Test.   REMEMBER: Instructions that are not followed completely may result in serious medical risk, up to and including death; or upon the discretion of your surgeon and anesthesiologist your surgery may need to be rescheduled.  Do not eat food or drink any fluids after midnight the night before surgery.  No gum chewing, lozengers or hard candies.   TAKE THESE MEDICATIONS THE MORNING OF SURGERY WITH A SIP OF WATER: - amLODipine (NORVASC) 5 MG tablet - Fluticasone-Salmeterol (ADVAIR) 500-50 MCG/DOSE AEPB - levothyroxine (SYNTHROID) 150 MCG tablet  Use inhaler albuterol (VENTOLIN HFA) 108 (90 Base) MCG/ACT inhaler on the day of surgery and bring to the hospital.  Stop Metformin 2 days prior to surgery. Do not take 06/12, 06/13, and do not take the day of surgery.  One week prior to surgery: Stop Anti-inflammatories (NSAIDS) such as Advil, Aleve, Ibuprofen, Motrin, Naproxen, Naprosyn and Aspirin based products such as Excedrin, Goodys Powder, BC Powder.  Stop ANY OVER THE COUNTER supplements until after surgery.  You may however, continue to take Tylenol if needed for pain up until the day of surgery.  No Alcohol for 24 hours before or after surgery.  No Smoking including e-cigarettes for 24 hours prior to surgery.  No chewable tobacco products for at least 6 hours prior to surgery.  No nicotine patches on the day of surgery.  Do not use any "recreational" drugs for at least a week prior to your surgery.  Please be advised that the combination of cocaine and anesthesia may have negative outcomes, up to and including death. If you test positive for cocaine, your surgery will be cancelled.  On the morning of  surgery brush your teeth with toothpaste and water, you may rinse your mouth with mouthwash if you wish. Do not swallow any toothpaste or mouthwash.  Do not wear jewelry, make-up, hairpins, clips or nail polish.  Do not wear lotions, powders, or perfumes.   Do not shave body from the neck down 48 hours prior to surgery just in case you cut yourself which could leave a site for infection.  Also, freshly shaved skin may become irritated if using the CHG soap.  Contact lenses, hearing aids and dentures may not be worn into surgery.  Do not bring valuables to the hospital. [facility] is not responsible for any missing/lost belongings or valuables.   Use CHG Soap or wipes as directed on instruction sheet.  Notify your doctor if there is any change in your medical condition (cold, fever, infection).  Wear comfortable clothing (specific to your surgery type) to the hospital.  Plan for stool softeners for home use; pain medications have a tendency to cause constipation. You can also help prevent constipation by eating foods high in fiber such as fruits and vegetables and drinking plenty of fluids as your diet allows.  After surgery, you can help prevent lung complications by doing breathing exercises.  Take deep breaths and cough every 1-2 hours. Your doctor may order a device called an Incentive Spirometer to help you take deep breaths. When coughing or sneezing, hold a pillow firmly against your incision with both hands. This is called "splinting." Doing this helps protect your incision. It also  decreases belly discomfort.  If you are being admitted to the hospital overnight, leave your suitcase in the car. After surgery it may be brought to your room.  If you are being discharged the day of surgery, you will not be allowed to drive home. You will need a responsible adult (18 years or older) to drive you home and stay with you that night.   If you are taking public transportation, you  will need to have a responsible adult (18 years or older) with you. Please confirm with your physician that it is acceptable to use public transportation.   Please call the [place] Dept. at [phone] if you have any questions about these instructions.  Surgery Visitation Policy:  Patients undergoing a surgery or procedure may have one family member or support person with them as long as that person is not COVID-19 positive or experiencing its symptoms.  That person may remain in the waiting area during the procedure.  Inpatient Visitation:    Visiting hours are 7 a.m. to 8 p.m. Inpatients will be allowed two visitors daily. The visitors may change each day during the patient's stay. No visitors under the age of 22. Any visitor under the age of 31 must be accompanied by an adult. The visitor must pass COVID-19 screenings, use hand sanitizer when entering and exiting the patient's room and wear a mask at all times, including in the patient's room. Patients must also wear a mask when staff or their visitor are in the room. Masking is required regardless of vaccination status.  Total Hip/Knee Replacement Preoperative Educational Video  To better prepare for surgery, please view our videos that explain the physical activity and discharge planning required to have the best surgical recovery at [facility].  [email or web address]      Questions? Call [phone] or email [email or web address]

## 2020-08-29 ENCOUNTER — Other Ambulatory Visit
Admission: RE | Admit: 2020-08-29 | Discharge: 2020-08-29 | Disposition: A | Discharge status: Home / Self Care | Payer: Medicaid Other | Source: Ambulatory Visit | Attending: Orthopedic Surgery | Admitting: Orthopedic Surgery

## 2020-08-29 DIAGNOSIS — Z01818 Encounter for other preprocedural examination: Secondary | ICD-10-CM | POA: Diagnosis not present

## 2020-08-29 LAB — HEMOGLOBIN A1C
Hgb A1c MFr Bld: 6.2 % — ABNORMAL HIGH (ref 4.8–5.6)
Mean Plasma Glucose: 131 mg/dL

## 2020-08-29 LAB — SARS CORONAVIRUS 2 (TAT 6-24 HRS): SARS Coronavirus 2: NEGATIVE

## 2020-08-30 LAB — URINE CULTURE: Culture: >=100000 — AB

## 2020-09-02 ENCOUNTER — Other Ambulatory Visit: Payer: Self-pay

## 2020-09-02 ENCOUNTER — Ambulatory Visit: Payer: Medicaid Other

## 2020-09-02 ENCOUNTER — Ambulatory Visit: Payer: Medicaid Other | Admitting: Urgent Care

## 2020-09-02 ENCOUNTER — Encounter
Admission: AD | Disposition: A | Discharge status: Home Health | Payer: Self-pay | Source: Home / Self Care | Attending: Orthopedic Surgery

## 2020-09-02 ENCOUNTER — Encounter: Payer: Self-pay | Admitting: Orthopedic Surgery

## 2020-09-02 ENCOUNTER — Inpatient Hospital Stay
Admission: AD | Admit: 2020-09-02 | Discharge: 2020-09-08 | DRG: 470 | Disposition: A | Discharge status: Home Health | Payer: Medicaid Other | Attending: Orthopedic Surgery | Admitting: Orthopedic Surgery

## 2020-09-02 DIAGNOSIS — J45909 Unspecified asthma, uncomplicated: Secondary | ICD-10-CM | POA: Diagnosis present

## 2020-09-02 DIAGNOSIS — Z20822 Contact with and (suspected) exposure to covid-19: Secondary | ICD-10-CM | POA: Diagnosis present

## 2020-09-02 DIAGNOSIS — R509 Fever, unspecified: Secondary | ICD-10-CM

## 2020-09-02 DIAGNOSIS — Z96651 Presence of right artificial knee joint: Secondary | ICD-10-CM

## 2020-09-02 DIAGNOSIS — Z96659 Presence of unspecified artificial knee joint: Secondary | ICD-10-CM | POA: Insufficient documentation

## 2020-09-02 DIAGNOSIS — K219 Gastro-esophageal reflux disease without esophagitis: Secondary | ICD-10-CM | POA: Diagnosis present

## 2020-09-02 DIAGNOSIS — E1129 Type 2 diabetes mellitus with other diabetic kidney complication: Secondary | ICD-10-CM | POA: Diagnosis present

## 2020-09-02 DIAGNOSIS — M1711 Unilateral primary osteoarthritis, right knee: Principal | ICD-10-CM | POA: Diagnosis present

## 2020-09-02 DIAGNOSIS — Z6841 Body Mass Index (BMI) 40.0 and over, adult: Secondary | ICD-10-CM

## 2020-09-02 DIAGNOSIS — I10 Essential (primary) hypertension: Secondary | ICD-10-CM | POA: Diagnosis present

## 2020-09-02 DIAGNOSIS — Z79890 Hormone replacement therapy (postmenopausal): Secondary | ICD-10-CM

## 2020-09-02 DIAGNOSIS — N182 Chronic kidney disease, stage 2 (mild): Secondary | ICD-10-CM | POA: Diagnosis present

## 2020-09-02 DIAGNOSIS — Z87891 Personal history of nicotine dependence: Secondary | ICD-10-CM

## 2020-09-02 DIAGNOSIS — R112 Nausea with vomiting, unspecified: Secondary | ICD-10-CM | POA: Diagnosis not present

## 2020-09-02 DIAGNOSIS — Z7951 Long term (current) use of inhaled steroids: Secondary | ICD-10-CM

## 2020-09-02 DIAGNOSIS — E876 Hypokalemia: Secondary | ICD-10-CM | POA: Diagnosis present

## 2020-09-02 DIAGNOSIS — N179 Acute kidney failure, unspecified: Secondary | ICD-10-CM | POA: Diagnosis present

## 2020-09-02 DIAGNOSIS — E039 Hypothyroidism, unspecified: Secondary | ICD-10-CM | POA: Diagnosis present

## 2020-09-02 DIAGNOSIS — Z79899 Other long term (current) drug therapy: Secondary | ICD-10-CM

## 2020-09-02 DIAGNOSIS — R42 Dizziness and giddiness: Secondary | ICD-10-CM | POA: Diagnosis not present

## 2020-09-02 DIAGNOSIS — Z7984 Long term (current) use of oral hypoglycemic drugs: Secondary | ICD-10-CM

## 2020-09-02 DIAGNOSIS — R5082 Postprocedural fever: Secondary | ICD-10-CM | POA: Diagnosis not present

## 2020-09-02 DIAGNOSIS — E1122 Type 2 diabetes mellitus with diabetic chronic kidney disease: Secondary | ICD-10-CM | POA: Diagnosis present

## 2020-09-02 DIAGNOSIS — Z880 Allergy status to penicillin: Secondary | ICD-10-CM

## 2020-09-02 DIAGNOSIS — D649 Anemia, unspecified: Secondary | ICD-10-CM | POA: Diagnosis present

## 2020-09-02 DIAGNOSIS — R32 Unspecified urinary incontinence: Secondary | ICD-10-CM | POA: Diagnosis not present

## 2020-09-02 DIAGNOSIS — E890 Postprocedural hypothyroidism: Secondary | ICD-10-CM | POA: Diagnosis present

## 2020-09-02 DIAGNOSIS — I129 Hypertensive chronic kidney disease with stage 1 through stage 4 chronic kidney disease, or unspecified chronic kidney disease: Secondary | ICD-10-CM | POA: Diagnosis present

## 2020-09-02 HISTORY — PX: TOTAL KNEE ARTHROPLASTY: SHX125

## 2020-09-02 LAB — TYPE AND SCREEN
ABO/RH(D): O POS
Antibody Screen: NEGATIVE

## 2020-09-02 LAB — POCT I-STAT, CHEM 8
BUN: 15 mg/dL (ref 6–20)
Calcium, Ion: 1.1 mmol/L — ABNORMAL LOW (ref 1.15–1.40)
Chloride: 102 mmol/L (ref 98–111)
Creatinine, Ser: 1.2 mg/dL — ABNORMAL HIGH (ref 0.44–1.00)
Glucose, Bld: 125 mg/dL — ABNORMAL HIGH (ref 70–99)
HCT: 33 % — ABNORMAL LOW (ref 36.0–46.0)
Hemoglobin: 11.2 g/dL — ABNORMAL LOW (ref 12.0–15.0)
Potassium: 3 mmol/L — ABNORMAL LOW (ref 3.5–5.1)
Sodium: 138 mmol/L (ref 135–145)
TCO2: 22 mmol/L (ref 22–32)

## 2020-09-02 LAB — GLUCOSE, CAPILLARY: Glucose-Capillary: 117 mg/dL — ABNORMAL HIGH (ref 70–99)

## 2020-09-02 LAB — ABO/RH: ABO/RH(D): O POS

## 2020-09-02 SURGERY — ARTHROPLASTY, KNEE, TOTAL
Anesthesia: Spinal | Site: Knee | Laterality: Right

## 2020-09-02 MED ORDER — LIDOCAINE HCL URETHRAL/MUCOSAL 2 % EX GEL
CUTANEOUS | Status: AC
  Filled 2020-09-02: qty 5

## 2020-09-02 MED ORDER — LEVOTHYROXINE SODIUM 50 MCG PO TABS
150.0000 ug | ORAL_TABLET | Freq: Every day | ORAL | Status: DC
  Administered 2020-09-03 – 2020-09-08 (×5): 150 ug via ORAL
  Filled 2020-09-02 (×5): qty 3

## 2020-09-02 MED ORDER — BISACODYL 5 MG PO TBEC: 10.0000 mg | DELAYED_RELEASE_TABLET | Freq: Every day | ORAL | Status: DC | PRN

## 2020-09-02 MED ORDER — BENZONATATE 100 MG PO CAPS
200.0000 mg | ORAL_CAPSULE | Freq: Three times a day (TID) | ORAL | Status: DC | PRN
  Administered 2020-09-04 – 2020-09-07 (×5): 200 mg via ORAL
  Filled 2020-09-02 (×5): qty 2

## 2020-09-02 MED ORDER — VASOPRESSIN 20 UNIT/ML IV SOLN
INTRAVENOUS | Status: AC
  Filled 2020-09-02: qty 1

## 2020-09-02 MED ORDER — ACETAMINOPHEN 500 MG PO TABS
ORAL_TABLET | ORAL | Status: AC
  Administered 2020-09-02: 1000 mg via ORAL
  Filled 2020-09-02: qty 2

## 2020-09-02 MED ORDER — METHOCARBAMOL 500 MG PO TABS
500.0000 mg | ORAL_TABLET | Freq: Four times a day (QID) | ORAL | Status: DC | PRN
  Administered 2020-09-02 – 2020-09-06 (×7): 500 mg via ORAL
  Filled 2020-09-02 (×8): qty 1

## 2020-09-02 MED ORDER — CHLORHEXIDINE GLUCONATE 0.12 % MT SOLN
OROMUCOSAL | Status: AC
  Administered 2020-09-02: 15 mL via OROMUCOSAL
  Filled 2020-09-02: qty 15

## 2020-09-02 MED ORDER — METFORMIN HCL 500 MG PO TABS
500.0000 mg | ORAL_TABLET | Freq: Every day | ORAL | Status: DC
  Administered 2020-09-03: 500 mg via ORAL
  Filled 2020-09-02: qty 1

## 2020-09-02 MED ORDER — ONDANSETRON HCL 4 MG PO TABS: 4.0000 mg | ORAL_TABLET | Freq: Four times a day (QID) | ORAL | Status: DC | PRN

## 2020-09-02 MED ORDER — DEXMEDETOMIDINE (PRECEDEX) IN NS 20 MCG/5ML (4 MCG/ML) IV SYRINGE
PREFILLED_SYRINGE | INTRAVENOUS | Status: DC | PRN
  Administered 2020-09-02: 12 ug via INTRAVENOUS

## 2020-09-02 MED ORDER — VASOPRESSIN 20 UNIT/ML IV SOLN
INTRAVENOUS | Status: DC | PRN
  Administered 2020-09-02: 2 [IU] via INTRAVENOUS

## 2020-09-02 MED ORDER — SODIUM CHLORIDE 0.9 % IV SOLN
INTRAVENOUS | Status: DC | PRN
  Administered 2020-09-02: 25 ug/min via INTRAVENOUS

## 2020-09-02 MED ORDER — ACETAMINOPHEN 500 MG PO TABS: 1000.0000 mg | ORAL_TABLET | ORAL | Status: AC

## 2020-09-02 MED ORDER — ORAL CARE MOUTH RINSE: 15.0000 mL | Freq: Once | OROMUCOSAL | Status: AC

## 2020-09-02 MED ORDER — POTASSIUM CHLORIDE 20 MEQ PO PACK
40.0000 meq | PACK | ORAL | Status: DC
  Administered 2020-09-02: 40 meq via ORAL
  Filled 2020-09-02: qty 2

## 2020-09-02 MED ORDER — CHLORHEXIDINE GLUCONATE CLOTH 2 % EX PADS: 6.0000 | MEDICATED_PAD | Freq: Once | CUTANEOUS | Status: DC

## 2020-09-02 MED ORDER — OXYCODONE HCL 5 MG PO TABS
10.0000 mg | ORAL_TABLET | ORAL | Status: DC | PRN
  Administered 2020-09-03: 15 mg via ORAL
  Administered 2020-09-07 – 2020-09-08 (×2): 10 mg via ORAL
  Filled 2020-09-02: qty 2
  Filled 2020-09-02: qty 3
  Filled 2020-09-02: qty 2

## 2020-09-02 MED ORDER — EPHEDRINE 5 MG/ML INJ
INTRAVENOUS | Status: AC
  Filled 2020-09-02: qty 10

## 2020-09-02 MED ORDER — ALBUTEROL SULFATE (2.5 MG/3ML) 0.083% IN NEBU: 3.0000 mL | INHALATION_SOLUTION | Freq: Four times a day (QID) | RESPIRATORY_TRACT | Status: DC | PRN

## 2020-09-02 MED ORDER — BUPIVACAINE-EPINEPHRINE 0.25% -1:200000 IJ SOLN
INTRAMUSCULAR | Status: DC | PRN
  Administered 2020-09-02: 60 mL

## 2020-09-02 MED ORDER — FENTANYL CITRATE (PF) 100 MCG/2ML IJ SOLN: 25.0000 ug | INTRAMUSCULAR | Status: DC | PRN

## 2020-09-02 MED ORDER — CLINDAMYCIN PHOSPHATE 900 MG/50ML IV SOLN
INTRAVENOUS | Status: AC
  Administered 2020-09-02: 900 mg via INTRAVENOUS
  Filled 2020-09-02: qty 50

## 2020-09-02 MED ORDER — PROPOFOL 1000 MG/100ML IV EMUL
INTRAVENOUS | Status: AC
  Filled 2020-09-02: qty 100

## 2020-09-02 MED ORDER — LIDOCAINE HCL (PF) 2 % IJ SOLN
INTRAMUSCULAR | Status: AC
  Filled 2020-09-02: qty 5

## 2020-09-02 MED ORDER — SODIUM CHLORIDE FLUSH 0.9 % IV SOLN
INTRAVENOUS | Status: AC
  Filled 2020-09-02: qty 10

## 2020-09-02 MED ORDER — NEOMYCIN-POLYMYXIN B GU 40-200000 IR SOLN
Status: DC | PRN
  Administered 2020-09-02: 4 mL
  Administered 2020-09-02: 12 mL

## 2020-09-02 MED ORDER — ONDANSETRON HCL 4 MG/2ML IJ SOLN
4.0000 mg | Freq: Four times a day (QID) | INTRAMUSCULAR | Status: DC | PRN
  Administered 2020-09-02 – 2020-09-03 (×3): 4 mg via INTRAVENOUS
  Filled 2020-09-02 (×3): qty 2

## 2020-09-02 MED ORDER — MORPHINE SULFATE 4 MG/ML IJ SOLN
INTRAMUSCULAR | Status: DC | PRN
  Administered 2020-09-02: 4 mg

## 2020-09-02 MED ORDER — POTASSIUM CHLORIDE IN NACL 20-0.9 MEQ/L-% IV SOLN
INTRAVENOUS | Status: DC
  Filled 2020-09-02 (×4): qty 1000

## 2020-09-02 MED ORDER — FENTANYL CITRATE (PF) 100 MCG/2ML IJ SOLN
INTRAMUSCULAR | Status: AC
  Filled 2020-09-02: qty 2

## 2020-09-02 MED ORDER — TRAMADOL HCL 50 MG PO TABS
50.0000 mg | ORAL_TABLET | Freq: Four times a day (QID) | ORAL | Status: DC
  Administered 2020-09-02 – 2020-09-03 (×5): 50 mg via ORAL
  Filled 2020-09-02 (×8): qty 1

## 2020-09-02 MED ORDER — HYDROMORPHONE HCL 1 MG/ML IJ SOLN: 0.5000 mg | INTRAMUSCULAR | Status: DC | PRN

## 2020-09-02 MED ORDER — ACETAMINOPHEN 500 MG PO TABS
1000.0000 mg | ORAL_TABLET | Freq: Four times a day (QID) | ORAL | Status: AC
  Administered 2020-09-02 – 2020-09-03 (×4): 1000 mg via ORAL
  Filled 2020-09-02 (×4): qty 2

## 2020-09-02 MED ORDER — FAMOTIDINE 20 MG PO TABS
ORAL_TABLET | ORAL | Status: AC
  Administered 2020-09-02: 20 mg via ORAL
  Filled 2020-09-02: qty 1

## 2020-09-02 MED ORDER — PHENYLEPHRINE HCL (PRESSORS) 10 MG/ML IV SOLN
INTRAVENOUS | Status: DC | PRN
  Administered 2020-09-02: 200 ug via INTRAVENOUS
  Administered 2020-09-02: 100 ug via INTRAVENOUS
  Administered 2020-09-02 (×2): 200 ug via INTRAVENOUS
  Administered 2020-09-02: 100 ug via INTRAVENOUS

## 2020-09-02 MED ORDER — SODIUM CHLORIDE 0.9 % IV SOLN
INTRAVENOUS | Status: DC | PRN
  Administered 2020-09-02: 60 mL

## 2020-09-02 MED ORDER — OXYCODONE HCL 5 MG PO TABS
5.0000 mg | ORAL_TABLET | ORAL | Status: DC | PRN
  Administered 2020-09-02: 10 mg via ORAL
  Administered 2020-09-02 (×2): 5 mg via ORAL
  Administered 2020-09-04: 10 mg via ORAL
  Administered 2020-09-04: 5 mg via ORAL
  Administered 2020-09-05 – 2020-09-06 (×3): 10 mg via ORAL
  Administered 2020-09-06 – 2020-09-07 (×3): 5 mg via ORAL
  Administered 2020-09-08: 10 mg via ORAL
  Administered 2020-09-08: 5 mg via ORAL
  Administered 2020-09-08: 10 mg via ORAL
  Filled 2020-09-02 (×2): qty 2
  Filled 2020-09-02: qty 1
  Filled 2020-09-02 (×3): qty 2
  Filled 2020-09-02 (×3): qty 1
  Filled 2020-09-02: qty 2
  Filled 2020-09-02: qty 1
  Filled 2020-09-02 (×2): qty 2
  Filled 2020-09-02 (×2): qty 1

## 2020-09-02 MED ORDER — KETOROLAC TROMETHAMINE 30 MG/ML IJ SOLN
INTRAMUSCULAR | Status: DC | PRN
  Administered 2020-09-02: 30 mg

## 2020-09-02 MED ORDER — POTASSIUM CHLORIDE CRYS ER 20 MEQ PO TBCR: 40.0000 meq | EXTENDED_RELEASE_TABLET | ORAL | Status: AC

## 2020-09-02 MED ORDER — DIPHENHYDRAMINE HCL 12.5 MG/5ML PO ELIX
12.5000 mg | ORAL_SOLUTION | ORAL | Status: DC | PRN
  Administered 2020-09-03: 12.5 mg via ORAL
  Filled 2020-09-02: qty 10

## 2020-09-02 MED ORDER — FAMOTIDINE 20 MG PO TABS: 20.0000 mg | ORAL_TABLET | Freq: Once | ORAL | Status: AC

## 2020-09-02 MED ORDER — TRANEXAMIC ACID-NACL 1000-0.7 MG/100ML-% IV SOLN
INTRAVENOUS | Status: AC
  Filled 2020-09-02: qty 100

## 2020-09-02 MED ORDER — ONDANSETRON HCL 4 MG/2ML IJ SOLN
INTRAMUSCULAR | Status: AC
  Filled 2020-09-02: qty 2

## 2020-09-02 MED ORDER — DEXAMETHASONE SODIUM PHOSPHATE 10 MG/ML IJ SOLN
INTRAMUSCULAR | Status: AC
  Filled 2020-09-02: qty 1

## 2020-09-02 MED ORDER — MONTELUKAST SODIUM 10 MG PO TABS
10.0000 mg | ORAL_TABLET | Freq: Every day | ORAL | Status: DC
  Administered 2020-09-02 – 2020-09-07 (×6): 10 mg via ORAL
  Filled 2020-09-02 (×6): qty 1

## 2020-09-02 MED ORDER — CELECOXIB 200 MG PO CAPS: 200.0000 mg | ORAL_CAPSULE | ORAL | Status: AC

## 2020-09-02 MED ORDER — MOMETASONE FURO-FORMOTEROL FUM 200-5 MCG/ACT IN AERO
2.0000 | INHALATION_SPRAY | Freq: Two times a day (BID) | RESPIRATORY_TRACT | Status: DC
  Administered 2020-09-02 – 2020-09-08 (×9): 2 via RESPIRATORY_TRACT
  Filled 2020-09-02: qty 8.8

## 2020-09-02 MED ORDER — VITAMIN D 25 MCG (1000 UNIT) PO TABS
1000.0000 [IU] | ORAL_TABLET | Freq: Every day | ORAL | Status: DC
  Administered 2020-09-02 – 2020-09-08 (×7): 1000 [IU] via ORAL
  Filled 2020-09-02 (×7): qty 1

## 2020-09-02 MED ORDER — DOCUSATE SODIUM 100 MG PO CAPS
100.0000 mg | ORAL_CAPSULE | Freq: Two times a day (BID) | ORAL | Status: DC
  Administered 2020-09-02 – 2020-09-08 (×12): 100 mg via ORAL
  Filled 2020-09-02 (×12): qty 1

## 2020-09-02 MED ORDER — KETOROLAC TROMETHAMINE 30 MG/ML IJ SOLN
INTRAMUSCULAR | Status: AC
  Filled 2020-09-02: qty 1

## 2020-09-02 MED ORDER — MIDAZOLAM HCL 2 MG/2ML IJ SOLN
INTRAMUSCULAR | Status: AC
  Filled 2020-09-02: qty 2

## 2020-09-02 MED ORDER — SENNOSIDES-DOCUSATE SODIUM 8.6-50 MG PO TABS
1.0000 | ORAL_TABLET | Freq: Every evening | ORAL | Status: DC | PRN
  Administered 2020-09-05: 1 via ORAL
  Filled 2020-09-02: qty 1

## 2020-09-02 MED ORDER — MAGNESIUM CITRATE PO SOLN
1.0000 | Freq: Once | ORAL | Status: DC | PRN
  Filled 2020-09-02 (×2): qty 296

## 2020-09-02 MED ORDER — BUPIVACAINE HCL (PF) 0.5 % IJ SOLN
INTRAMUSCULAR | Status: DC | PRN
  Administered 2020-09-02: 3 mL

## 2020-09-02 MED ORDER — CHLORHEXIDINE GLUCONATE 0.12 % MT SOLN: 15.0000 mL | Freq: Once | OROMUCOSAL | Status: AC

## 2020-09-02 MED ORDER — LIDOCAINE HCL (CARDIAC) PF 100 MG/5ML IV SOSY
PREFILLED_SYRINGE | INTRAVENOUS | Status: DC | PRN
  Administered 2020-09-02: 30 mg via INTRAVENOUS

## 2020-09-02 MED ORDER — OXYCODONE HCL 5 MG/5ML PO SOLN: 5.0000 mg | Freq: Once | ORAL | Status: DC | PRN

## 2020-09-02 MED ORDER — FENTANYL CITRATE (PF) 100 MCG/2ML IJ SOLN
INTRAMUSCULAR | Status: DC | PRN
  Administered 2020-09-02 (×2): 25 ug via INTRAVENOUS

## 2020-09-02 MED ORDER — ENOXAPARIN SODIUM 40 MG/0.4ML IJ SOSY
40.0000 mg | PREFILLED_SYRINGE | INTRAMUSCULAR | Status: DC
  Administered 2020-09-03 – 2020-09-08 (×6): 40 mg via SUBCUTANEOUS
  Filled 2020-09-02 (×6): qty 0.4

## 2020-09-02 MED ORDER — LOSARTAN POTASSIUM 50 MG PO TABS
50.0000 mg | ORAL_TABLET | Freq: Every day | ORAL | Status: DC
  Administered 2020-09-03: 50 mg via ORAL
  Filled 2020-09-02: qty 1

## 2020-09-02 MED ORDER — DEXMEDETOMIDINE (PRECEDEX) IN NS 20 MCG/5ML (4 MCG/ML) IV SYRINGE
PREFILLED_SYRINGE | INTRAVENOUS | Status: AC
  Filled 2020-09-02: qty 5

## 2020-09-02 MED ORDER — AMLODIPINE BESYLATE 5 MG PO TABS
5.0000 mg | ORAL_TABLET | Freq: Every day | ORAL | Status: DC
  Administered 2020-09-03 – 2020-09-04 (×2): 5 mg via ORAL
  Filled 2020-09-02 (×2): qty 1

## 2020-09-02 MED ORDER — GLYCOPYRROLATE 0.2 MG/ML IJ SOLN
INTRAMUSCULAR | Status: AC
  Filled 2020-09-02: qty 1

## 2020-09-02 MED ORDER — EPHEDRINE SULFATE 50 MG/ML IJ SOLN
INTRAMUSCULAR | Status: DC | PRN
  Administered 2020-09-02: 5 mg via INTRAVENOUS
  Administered 2020-09-02 (×3): 10 mg via INTRAVENOUS
  Administered 2020-09-02: 5 mg via INTRAVENOUS

## 2020-09-02 MED ORDER — ACETAMINOPHEN 10 MG/ML IV SOLN
INTRAVENOUS | Status: AC
  Filled 2020-09-02: qty 100

## 2020-09-02 MED ORDER — BUPIVACAINE LIPOSOME 1.3 % IJ SUSP
INTRAMUSCULAR | Status: AC
  Filled 2020-09-02: qty 20

## 2020-09-02 MED ORDER — OXYCODONE HCL 5 MG PO TABS: 5.0000 mg | ORAL_TABLET | Freq: Once | ORAL | Status: DC | PRN

## 2020-09-02 MED ORDER — ACETAMINOPHEN 325 MG PO TABS
325.0000 mg | ORAL_TABLET | Freq: Four times a day (QID) | ORAL | Status: DC | PRN
  Administered 2020-09-03 – 2020-09-06 (×3): 650 mg via ORAL
  Filled 2020-09-02 (×3): qty 2

## 2020-09-02 MED ORDER — METHOCARBAMOL 1000 MG/10ML IJ SOLN
500.0000 mg | Freq: Four times a day (QID) | INTRAVENOUS | Status: DC | PRN
  Filled 2020-09-02: qty 5

## 2020-09-02 MED ORDER — TRANEXAMIC ACID-NACL 1000-0.7 MG/100ML-% IV SOLN
1000.0000 mg | INTRAVENOUS | Status: AC
  Administered 2020-09-02: 1000 mg via INTRAVENOUS

## 2020-09-02 MED ORDER — PROPOFOL 10 MG/ML IV BOLUS
INTRAVENOUS | Status: DC | PRN
  Administered 2020-09-02 (×3): 10 mg via INTRAVENOUS
  Administered 2020-09-02: 20 mg via INTRAVENOUS

## 2020-09-02 MED ORDER — POTASSIUM CHLORIDE CRYS ER 10 MEQ PO TBCR
10.0000 meq | EXTENDED_RELEASE_TABLET | Freq: Every day | ORAL | Status: DC
  Administered 2020-09-02 – 2020-09-08 (×7): 10 meq via ORAL
  Filled 2020-09-02 (×7): qty 1

## 2020-09-02 MED ORDER — CLINDAMYCIN PHOSPHATE 900 MG/50ML IV SOLN: 900.0000 mg | Freq: Once | INTRAVENOUS | Status: AC

## 2020-09-02 MED ORDER — BUPIVACAINE-EPINEPHRINE (PF) 0.25% -1:200000 IJ SOLN
INTRAMUSCULAR | Status: AC
  Filled 2020-09-02: qty 30

## 2020-09-02 MED ORDER — SODIUM CHLORIDE 0.9 % IV SOLN: INTRAVENOUS | Status: DC

## 2020-09-02 MED ORDER — CELECOXIB 200 MG PO CAPS
ORAL_CAPSULE | ORAL | Status: AC
  Administered 2020-09-02: 200 mg via ORAL
  Filled 2020-09-02: qty 1

## 2020-09-02 MED ORDER — BUPIVACAINE HCL (PF) 0.5 % IJ SOLN
INTRAMUSCULAR | Status: AC
  Filled 2020-09-02: qty 10

## 2020-09-02 MED ORDER — HYDROCHLOROTHIAZIDE 25 MG PO TABS
25.0000 mg | ORAL_TABLET | Freq: Every day | ORAL | Status: DC
  Administered 2020-09-03: 25 mg via ORAL
  Filled 2020-09-02: qty 1

## 2020-09-02 MED ORDER — TRANEXAMIC ACID 1000 MG/10ML IV SOLN
INTRAVENOUS | Status: AC
  Filled 2020-09-02: qty 10

## 2020-09-02 MED ORDER — NEOMYCIN-POLYMYXIN B GU 40-200000 IR SOLN
Status: AC
  Filled 2020-09-02: qty 20

## 2020-09-02 MED ORDER — PROPOFOL 500 MG/50ML IV EMUL
INTRAVENOUS | Status: DC | PRN
  Administered 2020-09-02: 50 ug/kg/min via INTRAVENOUS

## 2020-09-02 MED ORDER — MIDAZOLAM HCL 5 MG/5ML IJ SOLN
INTRAMUSCULAR | Status: DC | PRN
  Administered 2020-09-02 (×2): 1 mg via INTRAVENOUS

## 2020-09-02 MED ORDER — CEFAZOLIN SODIUM-DEXTROSE 2-4 GM/100ML-% IV SOLN
2.0000 g | INTRAVENOUS | Status: AC
  Administered 2020-09-02: 2 g via INTRAVENOUS
  Administered 2020-09-02: 1 g via INTRAVENOUS

## 2020-09-02 MED ORDER — MORPHINE SULFATE (PF) 4 MG/ML IV SOLN
INTRAVENOUS | Status: AC
  Filled 2020-09-02: qty 1

## 2020-09-02 MED ORDER — CEFAZOLIN SODIUM-DEXTROSE 2-4 GM/100ML-% IV SOLN
2.0000 g | Freq: Four times a day (QID) | INTRAVENOUS | Status: AC
  Administered 2020-09-02 (×2): 2 g via INTRAVENOUS
  Filled 2020-09-02 (×3): qty 100

## 2020-09-02 MED ORDER — POTASSIUM CHLORIDE CRYS ER 20 MEQ PO TBCR
EXTENDED_RELEASE_TABLET | ORAL | Status: AC
  Administered 2020-09-02: 40 meq via ORAL
  Filled 2020-09-02: qty 2

## 2020-09-02 SURGICAL SUPPLY — 68 items
BLADE SAW 90X13X1.19 OSCILLAT (BLADE) ×2 IMPLANT
BLADE SAW 90X25X1.19 OSCILLAT (BLADE) ×2 IMPLANT
CEMENT HV SMART SET (Cement) ×4 IMPLANT
CEMENT TIBIA MBT (Knees) ×1 IMPLANT
CNTNR SPEC 2.5X3XGRAD LEK (MISCELLANEOUS) ×1
CONT SPEC 4OZ STER OR WHT (MISCELLANEOUS) ×1
CONTAINER SPEC 2.5X3XGRAD LEK (MISCELLANEOUS) ×1 IMPLANT
COOLER POLAR GLACIER W/PUMP (MISCELLANEOUS) ×2 IMPLANT
COVER WAND RF STERILE (DRAPES) ×2 IMPLANT
CUFF TOURN SGL QUICK 24 (TOURNIQUET CUFF)
CUFF TOURN SGL QUICK 34 (TOURNIQUET CUFF) ×1
CUFF TRNQT CYL 24X4X16.5-23 (TOURNIQUET CUFF) IMPLANT
CUFF TRNQT CYL 34X4.125X (TOURNIQUET CUFF) ×1 IMPLANT
DRAPE 3/4 80X56 (DRAPES) ×4 IMPLANT
DRAPE IMP U-DRAPE 54X76 (DRAPES) ×4 IMPLANT
DRAPE INCISE IOBAN 66X60 STRL (DRAPES) ×2 IMPLANT
DRAPE SURG 17X11 SM STRL (DRAPES) ×4 IMPLANT
DRSG AQUACEL AG ADV 3.5X10 (GAUZE/BANDAGES/DRESSINGS) ×2 IMPLANT
DURAPREP 26ML APPLICATOR (WOUND CARE) ×8 IMPLANT
ELECT REM PT RETURN 9FT ADLT (ELECTROSURGICAL) ×2
ELECTRODE REM PT RTRN 9FT ADLT (ELECTROSURGICAL) ×1 IMPLANT
FEMUR SIGMA PS SZ 3.0 R (Femur) ×2 IMPLANT
GAUZE SPONGE 4X4 12PLY STRL (GAUZE/BANDAGES/DRESSINGS) ×2 IMPLANT
GLOVE SRG 8 PF TXTR STRL LF DI (GLOVE) ×2 IMPLANT
GLOVE SURG ENC TEXT LTX SZ7.5 (GLOVE) ×4 IMPLANT
GLOVE SURG ORTHO LTX SZ9 (GLOVE) ×4 IMPLANT
GLOVE SURG SYN 7.5  E (GLOVE) ×2
GLOVE SURG SYN 7.5 E (GLOVE) ×2 IMPLANT
GLOVE SURG UNDER POLY LF SZ8 (GLOVE) ×2
GLOVE SURG UNDER POLY LF SZ9 (GLOVE) ×2 IMPLANT
GOWN STRL REUS TWL 2XL XL LVL4 (GOWN DISPOSABLE) ×2 IMPLANT
GOWN STRL REUS W/ TWL LRG LVL3 (GOWN DISPOSABLE) ×1 IMPLANT
GOWN STRL REUS W/ TWL LRG LVL4 (GOWN DISPOSABLE) ×1 IMPLANT
GOWN STRL REUS W/ TWL XL LVL3 (GOWN DISPOSABLE) ×1 IMPLANT
GOWN STRL REUS W/TWL LRG LVL3 (GOWN DISPOSABLE) ×1
GOWN STRL REUS W/TWL LRG LVL4 (GOWN DISPOSABLE) ×1
GOWN STRL REUS W/TWL XL LVL3 (GOWN DISPOSABLE) ×1
HOLDER FOLEY CATH W/STRAP (MISCELLANEOUS) ×2 IMPLANT
IMMBOLIZER KNEE 19 BLUE UNIV (SOFTGOODS) ×2 IMPLANT
INSERT TIBIAL PFC SIG SZ3 10MM (Knees) ×2 IMPLANT
IV NS IRRIG 3000ML ARTHROMATIC (IV SOLUTION) ×2 IMPLANT
KIT TURNOVER KIT A (KITS) ×2 IMPLANT
MANIFOLD NEPTUNE II (INSTRUMENTS) ×4 IMPLANT
NDL SAFETY ECLIPSE 18X1.5 (NEEDLE) ×1 IMPLANT
NEEDLE HYPO 18GX1.5 SHARP (NEEDLE) ×1
NEEDLE HYPO 22GX1.5 SAFETY (NEEDLE) ×2 IMPLANT
NEEDLE SPNL 20GX3.5 QUINCKE YW (NEEDLE) ×2 IMPLANT
NS IRRIG 1000ML POUR BTL (IV SOLUTION) ×2 IMPLANT
PACK TOTAL KNEE (MISCELLANEOUS) ×2 IMPLANT
PAD WRAPON POLAR KNEE (MISCELLANEOUS) ×1 IMPLANT
PATELLA DOME PFC 38MM (Knees) ×2 IMPLANT
PENCIL SMOKE EVACUATOR COATED (MISCELLANEOUS) ×2 IMPLANT
PULSAVAC PLUS IRRIG FAN TIP (DISPOSABLE) ×2
SPONGE LAP 18X18 RF (DISPOSABLE) IMPLANT
STAPLER SKIN PROX 35W (STAPLE) ×2 IMPLANT
SUCTION FRAZIER HANDLE 10FR (MISCELLANEOUS) ×1
SUCTION TUBE FRAZIER 10FR DISP (MISCELLANEOUS) ×1 IMPLANT
SUT ETHIBOND NAB CT1 #1 30IN (SUTURE) ×6 IMPLANT
SUT VIC AB 0 CT1 36 (SUTURE) ×4 IMPLANT
SUT VIC AB 2-0 CT1 (SUTURE) ×4 IMPLANT
SYR 20ML LL LF (SYRINGE) ×2 IMPLANT
SYR 30ML LL (SYRINGE) ×4 IMPLANT
TIBIA MBT CEMENT (Knees) ×2 IMPLANT
TIP FAN IRRIG PULSAVAC PLUS (DISPOSABLE) ×1 IMPLANT
TOWER CARTRIDGE SMART MIX (DISPOSABLE) ×2 IMPLANT
TRAY FOLEY MTR SLVR 16FR STAT (SET/KITS/TRAYS/PACK) ×2 IMPLANT
TUBE SUCT KAM VAC (TUBING) ×2 IMPLANT
WRAPON POLAR PAD KNEE (MISCELLANEOUS) ×2

## 2020-09-02 NOTE — Evaluation (Signed)
Physical Therapy Evaluation Patient Details Name: Katherine Estes MRN: [id number] DOB: January 13, 1961 Today's Date: 09/02/2020   History of Present Illness  Pt is s/p R TKA on 09/02/20.  Clinical Impression  Pt received in Semi-Fowler's position and agreeable to therapy.  Pt performed well with bed level exercises and noted pain to be well controlled.  Pt was pre-medicated prior to treatment.  Pt was able to perform bed mobility with good technique.  Pt then performed ambulation around the nursing station prior to returning to room.  Pt demonstrated one instance of twisting her knee when ambulating that was painful, however was fine and noted no residual pain.  Pt then transferred back to chair where she was left with all needs met.  Primary concern going forward is stair training and will be main focus going forward with therapy.  Pt will benefit from skilled PT intervention to increase independence and safety with basic mobility in preparation for discharge to the venue listed below.       Follow Up Recommendations Home health PT    Equipment Recommendations  Rolling walker with 5" wheels;3in1 (PT)    Recommendations for Other Services       Precautions / Restrictions Precautions Precautions: None;Knee Required Braces or Orthoses: Knee Immobilizer - Right Restrictions Weight Bearing Restrictions: Yes RLE Weight Bearing: Weight bearing as tolerated      Mobility  Bed Mobility Overal bed mobility: Modified Independent             General bed mobility comments: Extra time necessary    Transfers Overall transfer level: Modified independent Equipment used: Rolling walker (2 wheeled)                Ambulation/Gait Ambulation/Gait assistance: Modified independent (Device/Increase time) Gait Distance (Feet): 175 Feet Assistive device: Rolling walker (2 wheeled) Gait Pattern/deviations: Step-through pattern;Decreased step length - left;Decreased stance time -  right;Decreased stride length;Step-to pattern Gait velocity: decreased   General Gait Details: Pt with step-to pattern initially, progressing to step-through pattern as progression occurred.  Stairs            Wheelchair Mobility    Modified Rankin (Stroke Patients Only)       Balance Overall balance assessment: Modified Independent                                           Pertinent Vitals/Pain Pain Assessment: 0-10 Pain Score: 6  Pain Location: Medial R Knee Pain Descriptors / Indicators: Throbbing Pain Intervention(s): Limited activity within patient's tolerance;Monitored during session;Premedicated before session;Repositioned    Home Living Family/patient expects to be discharged to:: Private residence Living Arrangements: Parent Available Help at Discharge: Family Type of Home: House Home Access: Level entry     Home Layout: Two level        Prior Function Level of Independence: Independent               Hand Dominance   Dominant Hand: Right    Extremity/Trunk Assessment                Communication   Communication: No difficulties  Cognition Arousal/Alertness: Awake/alert Behavior During Therapy: WFL for tasks assessed/performed Overall Cognitive Status: Within Functional Limits for tasks assessed  General Comments      Exercises Total Joint Exercises Ankle Circles/Pumps: AROM;Strengthening;Both;10 reps;Supine Quad Sets: AROM;Strengthening;Both;10 reps;Supine Gluteal Sets: AROM;Strengthening;Both;10 reps;Supine Heel Slides: AROM;Strengthening;Both;10 reps;Supine Hip ABduction/ADduction: AROM;Strengthening;Both;10 reps;Supine Straight Leg Raises: AROM;Strengthening;Both;10 reps;Supine Marching in Standing: AROM;Strengthening;Both;10 reps;Standing Other Exercises Other Exercises: Pt educated on role of PT and service provided during hospital stay.  Pt also  educated on exercises and the benefits of performing in between bouts of therapy.   Assessment/Plan    PT Assessment Patient needs continued PT services  PT Problem List Decreased strength;Decreased range of motion;Decreased activity tolerance;Decreased mobility;Pain       PT Treatment Interventions DME instruction;Gait training;Stair training;Functional mobility training;Therapeutic activities;Therapeutic exercise;Balance training;Neuromuscular re-education    PT Goals (Current goals can be found in the Care Plan section)  Acute Rehab PT Goals Patient Stated Goal: To go home PT Goal Formulation: With patient Time For Goal Achievement: 09/16/20 Potential to Achieve Goals: Good    Frequency BID   Barriers to discharge Inaccessible home environment 13 steps to bedroom.    Co-evaluation               AM-PAC PT "6 Clicks" Mobility  Outcome Measure Help needed turning from your back to your side while in a flat bed without using bedrails?: None Help needed moving from lying on your back to sitting on the side of a flat bed without using bedrails?: None Help needed moving to and from a bed to a chair (including a wheelchair)?: A Little Help needed standing up from a chair using your arms (e.g., wheelchair or bedside chair)?: A Little Help needed to walk in hospital room?: A Little Help needed climbing 3-5 steps with a railing? : A Lot 6 Click Score: 19    End of Session Equipment Utilized During Treatment: Gait belt Activity Tolerance: Patient tolerated treatment well Patient left: in chair;with call bell/phone within reach;with chair alarm set Nurse Communication: Mobility status PT Visit Diagnosis: Unsteadiness on feet (R26.81);Other abnormalities of gait and mobility (R26.89);Muscle weakness (generalized) (M62.81);Difficulty in walking, not elsewhere classified (R26.2);Pain Pain - Right/Left: Right Pain - part of body: Knee    Time: 8676-1950 PT Time Calculation  (min) (ACUTE ONLY): 56 min   Charges:     PT Treatments $Gait Training: 23-37 mins $Therapeutic Exercise: 8-22 mins        Gwenlyn Saran, PT, DPT 09/02/20, 4:53 PM   [name] 09/02/2020, 4:50 PM

## 2020-09-02 NOTE — Anesthesia Procedure Notes (Signed)
Date/Time: 09/02/2020 8:15 AM Performed by: Johnna Acosta, CRNA Pre-anesthesia Checklist: Patient identified, Emergency Drugs available, Suction available, Patient being monitored and Timeout performed Patient Re-evaluated:Patient Re-evaluated prior to induction Oxygen Delivery Method: Simple face mask Preoxygenation: Pre-oxygenation with 100% oxygen Induction Type: IV induction

## 2020-09-02 NOTE — Plan of Care (Signed)
  Problem: Education: Goal: Knowledge of General Education information will improve Description: Including pain rating scale, medication(s)/side effects and non-pharmacologic comfort measures Outcome: Progressing   Problem: Health Behavior/Discharge Planning: Goal: Ability to manage health-related needs will improve Outcome: Progressing   Problem: Clinical Measurements: Goal: Ability to maintain clinical measurements within normal limits will improve Outcome: Progressing Goal: Will remain free from infection Outcome: Progressing Goal: Diagnostic test results will improve Outcome: Progressing Goal: Respiratory complications will improve Outcome: Progressing Goal: Cardiovascular complication will be avoided Outcome: Progressing   Problem: Activity: Goal: Risk for activity intolerance will decrease Outcome: Progressing   Problem: Nutrition: Goal: Adequate nutrition will be maintained Outcome: Progressing   Problem: Coping: Goal: Level of anxiety will decrease Outcome: Progressing   Problem: Elimination: Goal: Will not experience complications related to bowel motility Outcome: Progressing Goal: Will not experience complications related to urinary retention Outcome: Progressing   Problem: Pain Managment: Goal: General experience of comfort will improve Outcome: Progressing   Problem: Safety: Goal: Ability to remain free from injury will improve Outcome: Progressing   Problem: Skin Integrity: Goal: Risk for impaired skin integrity will decrease Outcome: Progressing   Problem: Education: Goal: Knowledge of the prescribed therapeutic regimen will improve Outcome: Progressing Goal: Individualized Educational Video(s) Outcome: Progressing   Problem: Activity: Goal: Ability to avoid complications of mobility impairment will improve Outcome: Progressing Goal: Range of joint motion will improve Outcome: Progressing   Problem: Pain Management: Goal: Pain level will  decrease with appropriate interventions Outcome: Progressing   Problem: Skin Integrity: Goal: Will show signs of wound healing Outcome: Progressing

## 2020-09-02 NOTE — Progress Notes (Signed)
Subjective:  POST OP CHECK:  s/p right TKA.   Patient reports right knee pain as moderate.  Patient states she had nausea earlier but feels better now.  She was able to tolerate eating food and feels better.  Objective:   VITALS:   Vitals:   09/02/20 1145 09/02/20 1200 09/02/20 1215 09/02/20 1247  BP: 104/63 97/62 (!) 102/59 97/60  Pulse: 85 86 83 85  Resp: 17 17 14 18   Temp:  (!) 97.2 F (36.2 C) (!) 97.3 F (36.3 C) (!) 97.5 F (36.4 C)  TempSrc:      SpO2: 98% 99% 100% 97%  Weight:      Height:        PHYSICAL EXAM: Right lower extremity: Spinal block is wearing off.  Patient can dorsiflex and plantarflex her ankle and flex and extend her toes.  She has palpable pedal pulses.  She has intact sensation light touch in the right lower extremity with reduced sensation over the medial right leg. Neurovascular intact Sensation intact distally Intact pulses distally Dorsiflexion/Plantar flexion intact Incision: dressing C/D/I No cellulitis present Compartment soft  LABS  Results for orders placed or performed during the hospital encounter of 09/02/20 (from the past 24 hour(s))  Type and screen [facility]     Status: None   Collection Time: 09/02/20  6:59 AM  Result Value Ref Range   ABO/RH(D) O POS    Antibody Screen NEG    Sample Expiration      09/05/2020,2359 Performed at [facility], [address] [place], [place] [address]   ABO/Rh     Status: None   Collection Time: 09/02/20  7:04 AM  Result Value Ref Range   ABO/RH(D)      O POS Performed at [facility], [address] [place], [place] [address]   I-STAT, [place] 8     Status: Abnormal   Collection Time: 09/02/20  7:07 AM  Result Value Ref Range   Sodium 138 135 - 145 mmol/L   Potassium 3.0 (L) 3.5 - 5.1 mmol/L   Chloride 102 98 - 111 mmol/L   BUN 15 6 - 20 mg/dL   Creatinine, Ser 1.20 (H) 0.44 - 1.00 mg/dL   Glucose, Bld 125 (H) 70 - 99 mg/dL   Calcium, Ion  1.10 (L) 1.15 - 1.40 mmol/L   TCO2 22 22 - 32 mmol/L   Hemoglobin 11.2 (L) 12.0 - 15.0 g/dL   HCT 33.0 (L) 36.0 - 46.0 %  Glucose, capillary     Status: Abnormal   Collection Time: 09/02/20 11:24 AM  Result Value Ref Range   Glucose-Capillary 117 (H) 70 - 99 mg/dL    DG Knee Right Port  Result Date: 09/02/2020 CLINICAL DATA:  Status post right knee replacement today. EXAM: PORTABLE RIGHT KNEE - 1-2 VIEW COMPARISON:  None. FINDINGS: Total arthroplasty is in place. No hardware complication or bony abnormality is seen. Gas in the soft tissues and surgical staples noted. IMPRESSION: Status post right knee replacement.  No acute finding. Electronically Signed   By: Inge Rise M.D.   On: 09/02/2020 12:45    Assessment/Plan: Day of Surgery   Active Problems:   S/P TKR (total knee replacement) using cement, right  Patient stable postop.  Her postop x-rays demonstrate the total knee arthroplasty components are well-positioned.  There is no evidence of postop complication.  Continue IV antibiotics x24 hours.  Foley catheter will be removed in the morning.  Labs will  be rechecked in the morning.  Patient will participate in physical therapy beginning this evening and continuing tomorrow.  Patient will begin Lovenox for DVT prophylaxis tomorrow.  Patient is ordered for oxycodone and Dilaudid for postop pain control.    Thornton Park , MD 09/02/2020, 2:10 PM

## 2020-09-02 NOTE — Anesthesia Procedure Notes (Signed)
Spinal  Patient location during procedure: OR Start time: 09/02/2020 7:57 AM End time: 09/02/2020 8:13 AM Reason for block: surgical anesthesia Staffing Performed: anesthesiologist and resident/CRNA  Anesthesiologist: Piscitello, Precious Haws, MD Resident/CRNA: Johnna Acosta, CRNA Preanesthetic Checklist Completed: patient identified, IV checked, site marked, risks and benefits discussed, surgical consent, monitors and equipment checked, pre-op evaluation and timeout performed Spinal Block Patient position: sitting Prep: ChloraPrep Patient monitoring: heart rate, continuous pulse ox, blood pressure and cardiac monitor Approach: midline Location: L4-5 Injection technique: single-shot Needle Needle type: [place] and Introducer  Needle gauge: 24 G Needle length: 9 cm Assessment Sensory level: T10 Events: CSF return Additional Notes Negative paresthesia. Negative blood return. Positive free-flowing CSF. Expiration date of kit checked and confirmed. Patient tolerated procedure well, without complications.

## 2020-09-02 NOTE — Anesthesia Preprocedure Evaluation (Signed)
Anesthesia Evaluation  Patient identified by MRN, date of birth, ID band Patient awake    Reviewed: Allergy & Precautions, NPO status , Patient's Chart, lab work & pertinent test results  History of Anesthesia Complications Negative for: history of anesthetic complications  Airway Mallampati: III  TM Distance: >3 FB Neck ROM: full    Dental  (+) Chipped   Pulmonary neg shortness of breath, asthma , former smoker,    Pulmonary exam normal        Cardiovascular Exercise Tolerance: Good hypertension, (-) angina(-) Past MI and (-) DOE Normal cardiovascular exam     Neuro/Psych  Headaches, negative psych ROS   GI/Hepatic negative GI ROS, Neg liver ROS,   Endo/Other  diabetes, Type 2Hypothyroidism Morbid obesity  Renal/GU Renal disease     Musculoskeletal  (+) Arthritis ,   Abdominal   Peds  Hematology negative hematology ROS (+)   Anesthesia Other Findings Past Medical History: No date: Allergy No date: Anemia No date: Arthritis No date: Asthma No date: Diabetes mellitus without complication (HCC) No date: H/O nephrolithotomy with removal of calculi No date: Hypertension No date: Hypothyroidism No date: Osteoporosis No date: Thyroid disease  Past Surgical History: No date: BLADDER SURGERY     Comment:  BLADDER LIFTED No date: BREAST BIOPSY; Left     Comment:  neg No date: BREAST SURGERY     Comment:  LUMP EXCISION LEFT BREAST. No date: CESAREAN SECTION 04/11/2020: COLONOSCOPY WITH PROPOFOL; N/A     Comment:  Procedure: COLONOSCOPY WITH PROPOFOL;  Surgeon: Jonathon Bellows, MD;  Location: [facility] ENDOSCOPY;  Service:               Endoscopy;  Laterality: N/A; No date: THYROIDECTOMY No date: TONSILLECTOMY No date: WISDOM TOOTH EXTRACTION  BMI    Body Mass Index: 41.42 kg/m      Reproductive/Obstetrics negative OB ROS                             Anesthesia  Physical Anesthesia Plan  ASA: 3  Anesthesia Plan: Spinal   Post-op Pain Management:    Induction:   PONV Risk Score and Plan:   Airway Management Planned: Natural Airway and Nasal Cannula  Additional Equipment:   Intra-op Plan:   Post-operative Plan:   Informed Consent: I have reviewed the patients History and Physical, chart, labs and discussed the procedure including the risks, benefits and alternatives for the proposed anesthesia with the patient or authorized representative who has indicated his/her understanding and acceptance.     Dental Advisory Given  Plan Discussed with: Anesthesiologist, CRNA and Surgeon  Anesthesia Plan Comments: (Patient reports no bleeding problems and no anticoagulant use.  Plan for spinal with backup GA  Patient consented for risks of anesthesia including but not limited to:  - adverse reactions to medications - damage to eyes, teeth, lips or other oral mucosa - nerve damage due to positioning  - risk of bleeding, infection and or nerve damage from spinal that could lead to paralysis - risk of headache or failed spinal - damage to teeth, lips or other oral mucosa - sore throat or hoarseness - damage to heart, brain, nerves, lungs, other parts of body or loss of life  Patient voiced understanding.)        Anesthesia Quick Evaluation

## 2020-09-02 NOTE — Transfer of Care (Signed)
Immediate Anesthesia Transfer of Care Note  Patient: Katherine Estes  Procedure(s) Performed: RIGHT TOTAL KNEE ARTHROPLASTY (Right: Knee)  Patient Location: PACU  Anesthesia Type:Spinal  Level of Consciousness: awake, alert  and oriented  Airway & Oxygen Therapy: Patient Spontanous Breathing and Patient connected to face mask oxygen  Post-op Assessment: Report given to RN and Post -op Vital signs reviewed and stable  Post vital signs: Reviewed and stable  Last Vitals:  Vitals Value Taken Time  BP 111/64 09/02/20 1121  Temp    Pulse 85 09/02/20 1121  Resp 20 09/02/20 1121  SpO2    Vitals shown include unvalidated device data.  Last Pain:  Vitals:   09/02/20 0703  TempSrc: Oral  PainSc: 0-No pain         Complications: No notable events documented.

## 2020-09-02 NOTE — H&P (Signed)
PREOPERATIVE H&P  Chief Complaint: Right Knee Osteoarthritis  HPI: Katherine Estes is a 60 y.o. female who presents for preoperative history and physical with a diagnosis of Right Knee Osteoarthritis. Symptoms of pain, swelling and limited range of motion are significantly impairing activities of daily living.  She has failed nonoperative management and wished to proceed with a right total knee arthroplasty.  Her x-rays demonstrate tricompartmental osteoarthritis with joint space narrowing, subchondral sclerosis and osteophytes.  Past Medical History:  Diagnosis Date   Allergy    Anemia    Arthritis    Asthma    Diabetes mellitus without complication ([place])    H/O nephrolithotomy with removal of calculi    Hypertension    Hypothyroidism    Osteoporosis    Thyroid disease    Past Surgical History:  Procedure Laterality Date   BLADDER SURGERY     BLADDER LIFTED   BREAST BIOPSY Left    neg   BREAST SURGERY     LUMP EXCISION LEFT BREAST.   CESAREAN SECTION     COLONOSCOPY WITH PROPOFOL N/A 04/11/2020   Procedure: COLONOSCOPY WITH PROPOFOL;  Surgeon: Jonathon Bellows, MD;  Location: [facility] ENDOSCOPY;  Service: Endoscopy;  Laterality: N/A;   THYROIDECTOMY     TONSILLECTOMY     WISDOM TOOTH EXTRACTION     Social History   Socioeconomic History   Marital status: Widowed    Spouse name: Not on file   Number of children: Not on file   Years of education: Not on file   Highest education level: Not on file  Occupational History   Not on file  Tobacco Use   Smoking status: Former    Packs/day: 0.25    Years: 3.00    Pack years: 0.75    Types: Cigarettes    Quit date: 05/21/1991    Years since quitting: 29.3   Smokeless tobacco: Never   Tobacco comments:    she only smoked when she drank  Vaping Use   Vaping Use: Never used  Substance and Sexual Activity   Alcohol use: Yes    Alcohol/week: 0.0 standard drinks    Comment: socially    Drug use: No   Sexual activity: Never   Other Topics Concern   Not on file  Social History Narrative   Not on file   Social Determinants of Health   Financial Resource Strain: Not on file  Food Insecurity: Not on file  Transportation Needs: Not on file  Physical Activity: Not on file  Stress: Not on file  Social Connections: Not on file   Family History  Problem Relation Age of Onset   Arthritis Mother    Asthma Mother    Hypertension Mother    Cancer Maternal Grandmother        THYRIOD   Breast cancer Neg Hx    Allergies  Allergen Reactions   Cinnamon     "not good"   Penicillins Hives and Rash   Prior to Admission medications   Medication Sig Start Date End Date Taking? Authorizing Provider  albuterol (VENTOLIN HFA) 108 (90 Base) MCG/ACT inhaler Inhale 1-2 puffs into the lungs every 6 (six) hours as needed for wheezing or shortness of breath.   Yes [provider]  amLODipine (NORVASC) 5 MG tablet Take 5 mg by mouth daily.   Yes [provider]  benzonatate (TESSALON) 200 MG capsule Take 200 mg by mouth 3 (three) times daily as needed for cough.   Yes [provider]  cholecalciferol (VITAMIN D) 1000 units tablet Take 1,000 Units by mouth daily.   Yes [provider]  Fluticasone-Salmeterol (ADVAIR) 500-50 MCG/DOSE AEPB Inhale 1 puff into the lungs daily as needed (shortness of breath or wheezing).   Yes [provider]  hydrochlorothiazide (HYDRODIURIL) 25 MG tablet Take 25 mg by mouth daily.   Yes [provider]  levothyroxine (SYNTHROID) 150 MCG tablet Take 150 mcg by mouth daily before breakfast.   Yes [provider]  losartan (COZAAR) 50 MG tablet Take 50 mg by mouth daily.   Yes [provider]  metFORMIN (GLUCOPHAGE) 500 MG tablet Take 500 mg by mouth daily with breakfast.   Yes [provider]  montelukast (SINGULAIR) 10 MG tablet Take 10 mg by mouth at bedtime.   Yes [provider]  potassium chloride  (KLOR-CON) 10 MEQ tablet Take 10 mEq by mouth daily. 01/21/20  Yes [provider]  triamcinolone cream (KENALOG) 0.1 % Apply 1 application topically 2 (two) times daily.   Yes [provider]  predniSONE (DELTASONE) 10 MG tablet Take 6 tablets  today, on day 2 take 5 tablets, day 3 take 4 tablets, day 4 take 3 tablets, day 5 take  2 tablets and 1 tablet the last day Patient not taking: No sig reported 08/14/20   Letitia Neri L, PA-C     Positive ROS: All other systems have been reviewed and were otherwise negative with the exception of those mentioned in the HPI and as above.  Physical Exam: General: Alert, no acute distress Cardiovascular: Regular rate and rhythm, no murmurs rubs or gallops.  No pedal edema Respiratory: Clear to auscultation bilaterally, no wheezes rales or rhonchi. No cyanosis, no use of accessory musculature GI: No organomegaly, abdomen is soft and non-tender nondistended with positive bowel sounds. Skin: Skin intact, no lesions within the operative field. Neurologic: Sensation intact distally Psychiatric: Patient is competent for consent with normal mood and affect Lymphatic: No cervical lymphadenopathy  MUSCULOSKELETAL: Right knee: Patient skin is intact.  There is no erythema, ecchymosis or large effusion.  Range of motion is approximately 0 to 120 degrees of flexion.  She has patellofemoral crepitus and mild grind but no apprehension.  She has tenderness over the medial joint line.  She has pain with McMurray's testing.  He has no ligamentous laxity.  She has no calf tenderness or lower leg edema.  She has palpable pedal pulses, intact/light touch and intact motor function distally without weakness.  Assessment: Right Knee Osteoarthritis  Plan: Plan for Procedure(s): RIGHT TOTAL KNEE ARTHROPLASTY  I reviewed the details of the operation as well as the postoperative course with the patient.  A preop history and physical was performed at the  bedside this morning.  I marked the patient's right knee according to the hospital's correct site of surgery protocol, after verbally confirming with the patient that this was the correct site of surgery.  I discussed the risks and benefits of surgery. The risks include but are not limited to infection requiring the removal of the prosthesis, bleeding requiring blood transfusion, nerve or blood vessel injury, joint stiffness or loss of motion, persistent pain, weakness or instability, loosening or failure of the prosthesis and the need for further surgery. Medical risks include but are not limited to DVT and pulmonary embolism, myocardial infarction, stroke, pneumonia, respiratory failure and death. Patient understood these risks and wished to proceed.     Thornton Park, MD   09/02/2020 8:01 AM

## 2020-09-02 NOTE — Progress Notes (Signed)
Please note:  patient only given 66meq KCL po

## 2020-09-02 NOTE — Op Note (Signed)
DATE OF SURGERY:  09/02/2020 TIME: 11:34 AM  PATIENT NAME:  Katherine Estes   AGE: 61 y.o.    PRE-OPERATIVE DIAGNOSIS:  Right Knee Osteoarthritis  POST-OPERATIVE DIAGNOSIS:  Same  PROCEDURE:  Procedure(s): RIGHT TOTAL KNEE ARTHROPLASTY  SURGEON:  Thornton Park, MD   ASSISTANT:  Roland Rack, PA  OPERATIVE IMPLANTS: Depuy PFC Sigma, Posterior Stabilized Femural component size 3, Tibia size rotating platform component size 3, Patella polyethylene 3-peg oval button size 38, with a 10 mm polyethylene insert.  EBL:  50  TOURNIQUET TIME: 130 minutes  PREOPERATIVE INDICATIONS:  Katherine Estes is an 60 y.o. female who has a diagnosis of  Right Knee Osteoarthritis and elected for a right total knee arthroplasty after failing nonoperative treatment.  Their knee pain significantly impacts their activity of daily living.  Radiographs have demonstrated tricompartmental osteoarthritis joint space narrowing, osteophytes, and subchondral sclerosis.  The risks, benefits, and alternatives were discussed at length including but not limited to the risks of infection, bleeding, nerve or blood vessel injury, knee stiffness, fracture, dislocation, loosening or failure of the hardware and the need for further surgery. Medical risks include but not limited to DVT and pulmonary embolism, myocardial infarction, stroke, pneumonia, respiratory failure and death. I discussed these risks with the patient in my office prior to the date of surgery. They understood these risks and were willing to proceed.  OPERATIVE FINDINGS AND UNIQUE ASPECTS OF THE CASE: Advanced tricompartmental osteoarthritis of the right knee with diffuse full-thickness chondral loss and large marginal osteophytes  OPERATIVE DESCRIPTION:  The patient was brought to the operative room and placed in a supine position after undergoing placement of a spinal anesthetic.  A Foley catheter was placed.  IV antibiotics were given. Patient  received Ancef 3 g IV and clindamycin 100 mg IV.  Patient received tranexamic acid IV prior to the tourniquet inflation as well.  The lower extremity was prepped and draped in the usual sterile fashion.  A time out was performed to verify the patient's name, date of birth, medical record number, correct site of surgery and correct procedure to be performed. The timeout was also used to confirm the patient received antibiotics and that appropriate instruments, implants and radiographs studies were available in the room.  The leg was elevated and exsanguinated with an Esmarch and the tourniquet was inflated to 275 mmHg for 130 minutes..  A midline incision was made over the right knee. Full-thickness skin flaps were developed. A medial parapatellar arthrotomy was then made and the patella everted and the knee was brought into 90 of flexion. Hoffa's fat pad along with the cruciate ligaments and medial and lateral menisci were resected.   The distal femoral intramedullary canal was opened with a drill and the intramedullary distal femoral cutting jig was inserted into the femoral canal pinned into position. It was set at 5 degrees resecting 10 mm off the distal femur.  Care was taken to protect the collateral ligaments during distal femoral resection.  The distal femoral resection was performed with an oscillating saw. The femoral cutting guide was then removed.  The extramedullary tibial cutting guide was then placed using the anterior tibial crest and second ray of the foot as a references.  The tibial cutting guide was adjusted to allow for appropriate posterior slope.  The tibial cutting block was pinned into position. The slotted stylus was used to measure the proximal tibial resection of 10 mm off the high lateral side.  The tibial long rod  alignment guide was then used to confirm position of the cutting block. A third cross pin through the tibial cutting block was then drilled into position to allow for  rotational stability. Care was taken during the tibial resection to protect the medial and collateral ligaments.  The resected tibial bone was removed along with the posterior horns of the menisci.  The PCL was sacrificed.  Extension gap was measured with a spacer block and alignment and extension was confirmed using a long alignment rod.  The attention was then turned back to the femur. The posterior referencing distal femoral sizing guide was applied to the distal femur.  The femur was sized to be a size 3. Rotation of the referencing guide was checked with the epicondylar axis and Whitesides line. Then the 4-in-1 cutting jig was then applied to the distal femur. A stylus was used to confirm that the anterior femur would not be notched.   Then the anterior, posterior and chamfer femoral cuts were then made with an oscillating saw.  The flexion gap was then measured with a flexion spacer block and long alignment rod and was found to be symmetric with the extension gap and perpendicular to mechanical axis of the tibia.  The distal femoral preparation was completed by performing the posterior stabilized box cut using the cutting block. The entry site for the intramedullary femoral guide was filled with autologous bone graft from bone previously resected earlier in the case.  The proximal tibia plateau was then sized with trial trays. The best coverage was achieved with a size 3. This tibial tray was then pinned into position. The proximal tibia was then prepared with the reamer and keel punch.  After tibial preparation was completed, all trial components were inserted with polyethylene trials.  The knee was found to have excellent balance and full motion with a size 10 mm tibial polyethylene insert..    The attention was then turned to preparation of the patella. The thickness of the patella was measured with a caliper, the diameter measured with the patella templates.  The patella resection was then made  with an oscillating saw using the patella cutting guide.  3 peg holes for the patella component were then drilled. The trial patella was then placed. Knee was taken through a full range of motion and deemed to be stable with the trial components. All trial components were then removed. The knee capsule was then injected with Exparel.  The knee joint capsule was injected with a mixture of quarter percent Marcaine, Toradol and morphine to assist with postoperative pain relief.  The joint was copiously irrigated with pulse lavage.  The final total knee arthroplasty components were then cemented into place with a 10 mm trial polyethylene insert and all excess methylmethacrylate was removed.  The joint was again copiously irrigated. After the cement had hardened the knee was again taken through a full range of motion. It was felt to be most stable with the 10 mm tibial polyethylene insert. The actual tibial polyethylene insert was then placed.   The knee was taken through a range of motion and the patella tracked well and the knee was again irrigated copiously.    The medial arthrotomy was closed with #1 Ethibond. The subcutaneous tissue closed with 0 and 2-0 vicryl, and skin approximated with staples.  A dry sterile and compressive dressing was applied.  A Polar Care was applied to the operative knee along with a knee immobilizer.  The patient was awakened and  brought to the PACU in stable and satisfactory condition.  All sharp, lap and instrument counts were correct at the conclusion the case. I spoke with the patient's brother by phone from the PACU to let them know the case had been performed without complication and the patient was stable in recovery room.

## 2020-09-03 ENCOUNTER — Observation Stay: Payer: Medicaid Other

## 2020-09-03 DIAGNOSIS — Z96651 Presence of right artificial knee joint: Secondary | ICD-10-CM | POA: Diagnosis not present

## 2020-09-03 DIAGNOSIS — J45909 Unspecified asthma, uncomplicated: Secondary | ICD-10-CM | POA: Diagnosis present

## 2020-09-03 DIAGNOSIS — E039 Hypothyroidism, unspecified: Secondary | ICD-10-CM

## 2020-09-03 DIAGNOSIS — E1129 Type 2 diabetes mellitus with other diabetic kidney complication: Secondary | ICD-10-CM | POA: Diagnosis present

## 2020-09-03 DIAGNOSIS — N182 Chronic kidney disease, stage 2 (mild): Secondary | ICD-10-CM

## 2020-09-03 DIAGNOSIS — N179 Acute kidney failure, unspecified: Secondary | ICD-10-CM

## 2020-09-03 DIAGNOSIS — I10 Essential (primary) hypertension: Secondary | ICD-10-CM

## 2020-09-03 DIAGNOSIS — D649 Anemia, unspecified: Secondary | ICD-10-CM | POA: Diagnosis present

## 2020-09-03 DIAGNOSIS — R42 Dizziness and giddiness: Secondary | ICD-10-CM

## 2020-09-03 LAB — CBC
HCT: 30.6 % — ABNORMAL LOW (ref 36.0–46.0)
Hemoglobin: 9.8 g/dL — ABNORMAL LOW (ref 12.0–15.0)
MCH: 29.2 pg (ref 26.0–34.0)
MCHC: 32 g/dL (ref 30.0–36.0)
MCV: 91.1 fL (ref 80.0–100.0)
Platelets: 195 10*3/uL (ref 150–400)
RBC: 3.36 MIL/uL — ABNORMAL LOW (ref 3.87–5.11)
RDW: 14.8 % (ref 11.5–15.5)
WBC: 9.6 10*3/uL (ref 4.0–10.5)
nRBC: 0 % (ref 0.0–0.2)

## 2020-09-03 LAB — BASIC METABOLIC PANEL
Anion gap: 5 (ref 5–15)
BUN: 18 mg/dL (ref 6–20)
CO2: 24 mmol/L (ref 22–32)
Calcium: 7.7 mg/dL — ABNORMAL LOW (ref 8.9–10.3)
Chloride: 105 mmol/L (ref 98–111)
Creatinine, Ser: 1.2 mg/dL — ABNORMAL HIGH (ref 0.44–1.00)
GFR, Estimated: 52 mL/min — ABNORMAL LOW (ref >60)
Glucose, Bld: 131 mg/dL — ABNORMAL HIGH (ref 70–99)
Potassium: 4 mmol/L (ref 3.5–5.1)
Sodium: 134 mmol/L — ABNORMAL LOW (ref 135–145)

## 2020-09-03 LAB — GLUCOSE, CAPILLARY: Glucose-Capillary: 156 mg/dL — ABNORMAL HIGH (ref 70–99)

## 2020-09-03 LAB — BRAIN NATRIURETIC PEPTIDE: B Natriuretic Peptide: 128.9 pg/mL — ABNORMAL HIGH (ref 0.0–100.0)

## 2020-09-03 MED ORDER — DM-GUAIFENESIN ER 30-600 MG PO TB12
1.0000 | ORAL_TABLET | Freq: Two times a day (BID) | ORAL | Status: DC | PRN
  Administered 2020-09-05: 1 via ORAL
  Filled 2020-09-03 (×2): qty 1

## 2020-09-03 MED ORDER — HYDRALAZINE HCL 20 MG/ML IJ SOLN
5.0000 mg | INTRAMUSCULAR | Status: DC | PRN
  Filled 2020-09-03: qty 0.25

## 2020-09-03 MED ORDER — LORAZEPAM 2 MG/ML IJ SOLN
1.0000 mg | Freq: Once | INTRAMUSCULAR | Status: AC
  Administered 2020-09-03: 1 mg via INTRAVENOUS
  Filled 2020-09-03: qty 1

## 2020-09-03 MED ORDER — SODIUM CHLORIDE 0.9 % IV SOLN
12.5000 mg | Freq: Four times a day (QID) | INTRAVENOUS | Status: DC | PRN
  Filled 2020-09-03: qty 0.5

## 2020-09-03 MED ORDER — SODIUM CHLORIDE 0.9 % IV SOLN: INTRAVENOUS | Status: DC

## 2020-09-03 MED ORDER — INSULIN ASPART 100 UNIT/ML IJ SOLN
0.0000 [IU] | Freq: Three times a day (TID) | INTRAMUSCULAR | Status: DC
  Administered 2020-09-04 – 2020-09-08 (×5): 2 [IU] via SUBCUTANEOUS
  Filled 2020-09-03 (×4): qty 1

## 2020-09-03 MED ORDER — IPRATROPIUM-ALBUTEROL 0.5-2.5 (3) MG/3ML IN SOLN
3.0000 mL | Freq: Four times a day (QID) | RESPIRATORY_TRACT | Status: DC
  Administered 2020-09-03 – 2020-09-04 (×2): 3 mL via RESPIRATORY_TRACT
  Filled 2020-09-03 (×3): qty 3

## 2020-09-03 MED ORDER — MECLIZINE HCL 25 MG PO TABS
25.0000 mg | ORAL_TABLET | Freq: Three times a day (TID) | ORAL | Status: DC | PRN
  Filled 2020-09-03: qty 1

## 2020-09-03 MED ORDER — ALBUTEROL SULFATE (2.5 MG/3ML) 0.083% IN NEBU
3.0000 mL | INHALATION_SOLUTION | RESPIRATORY_TRACT | Status: DC | PRN
  Administered 2020-09-03: 3 mL via RESPIRATORY_TRACT

## 2020-09-03 MED ORDER — IPRATROPIUM-ALBUTEROL 0.5-2.5 (3) MG/3ML IN SOLN: 3.0000 mL | RESPIRATORY_TRACT | Status: DC

## 2020-09-03 MED ORDER — INSULIN ASPART 100 UNIT/ML IJ SOLN: 0.0000 [IU] | Freq: Every day | INTRAMUSCULAR | Status: DC

## 2020-09-03 NOTE — Evaluation (Signed)
Occupational Therapy Evaluation Patient Details Name: Katherine Estes MRN: [id number] DOB: 01-29-1961 Today's Date: 09/03/2020    History of Present Illness Pt is s/p R TKA on 09/02/20.   Clinical Impression   Pt seen for OT evaluation this date, POD#1 from above surgery. Pt was independent in all ADLs prior to surgery, walking without DME. No history of falls. Pt reports no pain in immobile, supine position, but pain spikes to as high as 8/10 with movement. Pt also reports nausea and vomiting this AM. Pt currently requires Mod A for LB dressing while in seated position due to pain and limited AROM of R knee. Pt instructed in polar care mgt, falls prevention strategies, home/routines modifications, DME/AE for LB bathing and dressing tasks, and compression stocking mgt. Pt would benefit from skilled OT services including additional instruction in dressing techniques with or without assistive devices for dressing and bathing skills to support recall and carryover prior to discharge and ultimately to maximize safety, independence, and minimize falls risk and caregiver burden. Do not currently anticipate any OT needs following this hospitalization.       Follow Up Recommendations  No OT follow up    Equipment Recommendations  3 in 1 bedside commode;Other (comment) (RW)    Recommendations for Other Services       Precautions / Restrictions Precautions Precautions: Knee;Fall Required Braces or Orthoses: Knee Immobilizer - Right Restrictions Weight Bearing Restrictions: Yes RLE Weight Bearing: Weight bearing as tolerated      Mobility Bed Mobility Overal bed mobility: Modified Independent             General bed mobility comments: Extra time necessary    Transfers Overall transfer level: Needs assistance Equipment used: Rolling walker (2 wheeled) Transfers: Sit to/from [employer] Transfers   Stand pivot transfers: Supervision            Balance Overall balance  assessment: Modified Independent                                         ADL either performed or assessed with clinical judgement   ADL Overall ADL's : Needs assistance/impaired                     Lower Body Dressing: Moderate assistance   Toilet Transfer: Minimal assistance                   Vision Baseline Vision/History: No visual deficits Patient Visual Report: No change from baseline       Perception     Praxis      Pertinent Vitals/Pain Pain Score: 6  Pain Location: Medial R Knee Pain Descriptors / Indicators: Throbbing;Grimacing;Guarding;Sharp Pain Intervention(s): Limited activity within patient's tolerance;Monitored during session;Premedicated before session;Repositioned     Hand Dominance Right   Extremity/Trunk Assessment Upper Extremity Assessment Upper Extremity Assessment: Overall WFL for tasks assessed   Lower Extremity Assessment Lower Extremity Assessment: LLE deficits/detail;RLE deficits/detail RLE Deficits / Details: WFL LLE Deficits / Details: s/p TKA LLE: Unable to fully assess due to pain LLE Sensation: WNL       Communication Communication Communication: No difficulties   Cognition Arousal/Alertness: Awake/alert Behavior During Therapy: WFL for tasks assessed/performed Overall Cognitive Status: Within Functional Limits for tasks assessed  General Comments       Exercises Other Exercises Other Exercises: Pt educated re: polar care mgmt, TED hose mgmt, falls prevention, safe use of DME and AE, routines/home modifications   Shoulder Instructions      Home Living Family/patient expects to be discharged to:: Private residence Living Arrangements: Parent;Children;Other relatives (Pt lives with her mother, her aunt, 2 of her brothers, and her son) Available Help at Discharge: Family;Available 24 hours/day Type of Home: House Home Access: Level entry      Home Layout: Two level;1/2 bath on main level;Bed/bath upstairs Alternate Level Stairs-Number of Steps: 13 Alternate Level Stairs-Rails: Right Bathroom Shower/Tub: Teacher, early years/pre: Standard Bathroom Accessibility: No              Prior Functioning/Environment Level of Independence: Independent                 OT Problem List: Decreased strength;Decreased range of motion;Decreased activity tolerance;Impaired balance (sitting and/or standing);Pain;Decreased knowledge of use of DME or AE      OT Treatment/Interventions: Self-care/ADL training;Therapeutic exercise;Patient/family education;Balance training;Therapeutic activities;DME and/or AE instruction    OT Goals(Current goals can be found in the care plan section) Acute Rehab OT Goals Patient Stated Goal: To go home OT Goal Formulation: With patient Time For Goal Achievement: 09/17/20 ADL Goals Pt Will Perform Lower Body Dressing: with modified independence;with adaptive equipment;sitting/lateral leans Pt Will Transfer to Toilet: stand pivot transfer;with modified independence;bedside commode Pt Will Perform Tub/Shower Transfer: shower seat;Stand pivot transfer;rolling walker;with min guard assist  OT Frequency: Min 1X/week   Barriers to D/C: Inaccessible home environment  pt's bedroom/bathroom on 2nd floor       Co-evaluation              AM-PAC OT "6 Clicks" Daily Activity     Outcome Measure Help from another person eating meals?: None Help from another person taking care of personal grooming?: None Help from another person toileting, which includes using toliet, bedpan, or urinal?: A Little Help from another person bathing (including washing, rinsing, drying)?: A Little Help from another person to put on and taking off regular upper body clothing?: None Help from another person to put on and taking off regular lower body clothing?: A Lot 6 Click Score: 20   End of Session Equipment  Utilized During Treatment: Rolling walker  Activity Tolerance: Patient tolerated treatment well Patient left: in bed;with call bell/phone within reach;with bed alarm set  OT Visit Diagnosis: Unsteadiness on feet (R26.81);Muscle weakness (generalized) (M62.81);Other abnormalities of gait and mobility (R26.89)                Time: 5462-7035 OT Time Calculation (min): 40 min Charges:  OT General Charges $OT Visit: 1 Visit OT Evaluation $OT Eval Moderate Complexity: 1 Mod OT Treatments $Self Care/Home Management : 23-37 mins Josiah Lobo, PhD, MS, OTR/L 09/03/20, 3:00 PM

## 2020-09-03 NOTE — Progress Notes (Signed)
Subjective:  POD #1 s/p right TKA.   Patient reports right pain as mild to moderate.  Patient explains that she had significant dizziness/vertigo with nausea and vomiting this morning.  This prevented her from participating with physical therapy.  Patient states she has had dizziness/vertigo in the past but not this significant.  Objective:   VITALS:   Vitals:   09/03/20 0429 09/03/20 0830 09/03/20 0935 09/03/20 1126  BP: 128/72 103/84 110/73 (!) 106/54  Pulse: 100 87 81 78  Resp: 18 18  18   Temp: 99 F (37.2 C) 98.5 F (36.9 C)  98 F (36.7 C)  TempSrc:  Oral  Oral  SpO2: 90% 96% (!) 89% 93%  Weight:      Height:        PHYSICAL EXAM: Right lower extremity Neurovascular intact Sensation intact distally Intact pulses distally Dorsiflexion/Plantar flexion intact Incision: dressing C/D/I No cellulitis present Compartment soft  LABS  Results for orders placed or performed during the hospital encounter of 09/02/20 (from the past 24 hour(s))  CBC     Status: Abnormal   Collection Time: 09/03/20  4:19 AM  Result Value Ref Range   WBC 9.6 4.0 - 10.5 K/uL   RBC 3.36 (L) 3.87 - 5.11 MIL/uL   Hemoglobin 9.8 (L) 12.0 - 15.0 g/dL   HCT 30.6 (L) 36.0 - 46.0 %   MCV 91.1 80.0 - 100.0 fL   MCH 29.2 26.0 - 34.0 pg   MCHC 32.0 30.0 - 36.0 g/dL   RDW 14.8 11.5 - 15.5 %   Platelets 195 150 - 400 K/uL   nRBC 0.0 0.0 - 0.2 %  Basic metabolic panel     Status: Abnormal   Collection Time: 09/03/20  4:19 AM  Result Value Ref Range   Sodium 134 (L) 135 - 145 mmol/L   Potassium 4.0 3.5 - 5.1 mmol/L   Chloride 105 98 - 111 mmol/L   CO2 24 22 - 32 mmol/L   Glucose, Bld 131 (H) 70 - 99 mg/dL   BUN 18 6 - 20 mg/dL   Creatinine, Ser 1.20 (H) 0.44 - 1.00 mg/dL   Calcium 7.7 (L) 8.9 - 10.3 mg/dL   GFR, Estimated 52 (L) >60 mL/min   Anion gap 5 5 - 15  Glucose, capillary     Status: Abnormal   Collection Time: 09/03/20  9:37 AM  Result Value Ref Range   Glucose-Capillary 156 (H) 70 -  99 mg/dL    DG Knee Right Port  Result Date: 09/02/2020 CLINICAL DATA:  Status post right knee replacement today. EXAM: PORTABLE RIGHT KNEE - 1-2 VIEW COMPARISON:  None. FINDINGS: Total arthroplasty is in place. No hardware complication or bony abnormality is seen. Gas in the soft tissues and surgical staples noted. IMPRESSION: Status post right knee replacement.  No acute finding. Electronically Signed   By: Inge Rise M.D.   On: 09/02/2020 12:45    Assessment/Plan: 1 Day Post-Op   Active Problems:   S/P TKR (total knee replacement) using cement, right   S/P TKR (total knee replacement) using cement  Patient's dizziness and nausea and vomiting may be medication related.  However I have consulted the hospitalist service for further evaluation.  I have added Phenergan to her medications to help with her nausea and vomiting.  Patient will wear her knee immobilizer while laying in bed but may remove it for physical and occupational therapy.  The knee immobilizer is to help maintain knee extension and avoid developing  a flexion contracture.  Patient had pain while trying to use the bone foam and may benefit more from having a towel roll under her heel.  Her dressing will be changed tomorrow.  Her potassium has normalized to 4.0 today, up from 3.0 yesterday.  Hemoglobin and hematocrit are stable.    Thornton Park , MD 09/03/2020, 1:01 PM

## 2020-09-03 NOTE — Progress Notes (Signed)
Physical Therapy Treatment Patient Details Name: Katherine Estes MRN: [id number] DOB: 09/22/1960 Today's Date: 09/03/2020    History of Present Illness Pt is s/p R TKA on 09/02/20.    PT Comments    Pt received in Semi-Fowler's position and agreeable to therapy.  Pt still feeling nauseous, however stated that she knew she needed to "get up and get moving".  Pt able to perform bed mobility with supervision and progress to standing.  Pt then requested to use the restroom prior to ambulating.  Therapist assisted with self-care and educated on lowering technique and hand placement for transfer.  Pt then able to ambulate ~90 feet out to hallway before getting feeling nauseous again and requested to return back to room.  Pt was much slower with gait pattern, however her SpO2 and HR stayed within normal limits throughout session.  Pt returned to bed where she was left with all needs met.  Current discharge plans to HHPT remain appropriate at this time, dependent on ability to perform stair training, and relief from nausea.  Pt will continue to benefit from skilled therapy in order to address deficits listed below.     Follow Up Recommendations  Home health PT     Equipment Recommendations  Rolling walker with 5" wheels;3in1 (PT)    Recommendations for Other Services       Precautions / Restrictions Precautions Precautions: Knee;Fall Required Braces or Orthoses: Knee Immobilizer - Right Restrictions Weight Bearing Restrictions: Yes RLE Weight Bearing: Weight bearing as tolerated    Mobility  Bed Mobility Overal bed mobility: Modified Independent             General bed mobility comments: Extra time necessary    Transfers Overall transfer level: Needs assistance Equipment used: Rolling walker (2 wheeled) Transfers: Sit to/from Stand   Stand pivot transfers: Min guard          Ambulation/Gait Ambulation/Gait assistance: Modified independent (Device/Increase time) Gait  Distance (Feet): 100 Feet Assistive device: Rolling walker (2 wheeled) Gait Pattern/deviations: Step-through pattern;Decreased step length - left;Decreased stance time - right;Decreased stride length;Step-to pattern Gait velocity: decreased   General Gait Details: Pt able to progress well with ambulation, however due to feeling nauseous, unable to ambulate greater distances today.   Stairs             Wheelchair Mobility    Modified Rankin (Stroke Patients Only)       Balance Overall balance assessment: Modified Independent                                          Cognition Arousal/Alertness: Awake/alert Behavior During Therapy: WFL for tasks assessed/performed Overall Cognitive Status: Within Functional Limits for tasks assessed                                        Exercises Other Exercises Other Exercises: Pt educated re: polar care mgmt, TED hose mgmt, falls prevention, safe use of DME and AE, routines/home modifications    General Comments        Pertinent Vitals/Pain Pain Assessment: 0-10 Pain Score: 6  Pain Location: Medial R Knee Pain Descriptors / Indicators: Throbbing;Grimacing;Guarding;Sharp Pain Intervention(s): Limited activity within patient's tolerance;Monitored during session;Premedicated before session;Repositioned    Home Living Family/patient expects to be discharged to:: Private  residence Living Arrangements: Parent;Children;Other relatives (Pt lives with her mother, her aunt, 2 of her brothers, and her son) Available Help at Discharge: Family;Available 24 hours/day Type of Home: House Home Access: Level entry   Home Layout: Two level;1/2 bath on main level;Bed/bath upstairs        Prior Function Level of Independence: Independent          PT Goals (current goals can now be found in the care plan section) Acute Rehab PT Goals Patient Stated Goal: To go home PT Goal Formulation: With patient Time  For Goal Achievement: 09/16/20 Potential to Achieve Goals: Good Progress towards PT goals: Progressing toward goals    Frequency    BID      PT Plan      Co-evaluation              AM-PAC PT "6 Clicks" Mobility   Outcome Measure  Help needed turning from your back to your side while in a flat bed without using bedrails?: None Help needed moving from lying on your back to sitting on the side of a flat bed without using bedrails?: None Help needed moving to and from a bed to a chair (including a wheelchair)?: A Little Help needed standing up from a chair using your arms (e.g., wheelchair or bedside chair)?: A Little Help needed to walk in hospital room?: A Little Help needed climbing 3-5 steps with a railing? : A Lot 6 Click Score: 19    End of Session Equipment Utilized During Treatment: Gait belt Activity Tolerance: Patient tolerated treatment well Patient left: in chair;with call bell/phone within reach;with chair alarm set Nurse Communication: Mobility status PT Visit Diagnosis: Unsteadiness on feet (R26.81);Other abnormalities of gait and mobility (R26.89);Muscle weakness (generalized) (M62.81);Difficulty in walking, not elsewhere classified (R26.2);Pain Pain - Right/Left: Right Pain - part of body: Knee     Time: 7543-6067 PT Time Calculation (min) (ACUTE ONLY): 57 min  Charges:  $Gait Training: 23-37 mins $Self Care/Home Management: 23-37                     Gwenlyn Saran, PT, DPT 09/03/20, 3:51 PM    [name] 09/03/2020, 3:47 PM

## 2020-09-03 NOTE — Progress Notes (Signed)
PT Cancellation Note  Patient Details Name: Katherine Estes MRN: [id number] DOB: 21-Jul-1960   Cancelled Treatment:    Reason Eval/Treat Not Completed: Patient declined, no reason specified.  PT chart reviewed and attempted to see pt.  Pt is currently nauseous and requesting to skip therapy this morning.  Pt will be seen at later date/time as medically appropriate.   Gwenlyn Saran, PT, DPT 09/03/20, 11:37 AM

## 2020-09-03 NOTE — Plan of Care (Signed)
  Problem: Education: Goal: Knowledge of General Education information will improve Description: Including pain rating scale, medication(s)/side effects and non-pharmacologic comfort measures Outcome: Progressing   Problem: Health Behavior/Discharge Planning: Goal: Ability to manage health-related needs will improve Outcome: Progressing   Problem: Clinical Measurements: Goal: Ability to maintain clinical measurements within normal limits will improve Outcome: Progressing Goal: Will remain free from infection Outcome: Progressing Goal: Diagnostic test results will improve Outcome: Progressing Goal: Respiratory complications will improve Outcome: Progressing Goal: Cardiovascular complication will be avoided Outcome: Progressing   Problem: Activity: Goal: Risk for activity intolerance will decrease Outcome: Progressing   Problem: Nutrition: Goal: Adequate nutrition will be maintained Outcome: Progressing   Problem: Coping: Goal: Level of anxiety will decrease Outcome: Progressing   Problem: Elimination: Goal: Will not experience complications related to bowel motility Outcome: Progressing Goal: Will not experience complications related to urinary retention Outcome: Progressing   Problem: Pain Managment: Goal: General experience of comfort will improve Outcome: Progressing   Problem: Safety: Goal: Ability to remain free from injury will improve Outcome: Progressing   Problem: Skin Integrity: Goal: Risk for impaired skin integrity will decrease Outcome: Progressing   Problem: Education: Goal: Knowledge of the prescribed therapeutic regimen will improve Outcome: Progressing Goal: Individualized Educational Video(s) Outcome: Progressing   Problem: Activity: Goal: Ability to avoid complications of mobility impairment will improve Outcome: Progressing Goal: Range of joint motion will improve Outcome: Progressing

## 2020-09-03 NOTE — Consult Note (Addendum)
Medical Consultation   Katherine Estes  ZOX:[id number]  DOB: 06-14-1960  DOA: 09/02/2020  PCP: [facility] [employer] (  Outpatient Specialists:    Requesting physician: -Dr. Mack Guise of Ortho  Reason for consultation: -Vertigo after surgery   History of Present Illness: Katherine Estes is an 60 y.o. female hypertension, diabetes mellitus, asthma, anemia, kidney stone, CKD stage II, hypothyroidism, who was admitted by Dr. Mack Guise of Ortho for right knee replacement on 6/14. Pt developed vertigo after surgery. We are asked to consult.  Pt has hx of right Knee osteoarthritis. She has failed nonoperative management. Pt was admitted and underwent total right knee replacement yesterday.  After surgery, patient had one episode of hypotension which has resolved.  Currently blood pressure 106/54.  Patient developed vertigo toady. She states that has nausea, vomited once and dizziness.  She feels that the room is spinning around her.  No ear ringing.  Her symptoms are not affected by changing her head position.  Denies unilateral numbness or tingling in extremities.  No facial droop or slurred speech.  She has mild pain in the right knee surgical site.  No chest pain or shortness breath.  She has mild cough.  No fever or chills.  Denies diarrhea or abdominal pain.  No symptoms of UTI.  Lab: WBC 9.6, COVID PCR negative on 6/10, worsening renal function with creatinine 1.20, BUN 18 (baseline creatinine 0.77 on 08/28/2020).  Vital sign: Blood pressure 106/54, heart rate 100, 78, RR 20, 18, current oxygen saturation 98% on room air.  Review of Systems:   General: no fevers, chills, no changes in body weight, no changes in appetite Skin: no rash HEENT: no blurry vision, hearing changes or sore throat Pulm: has dyspnea, coughing, no wheezing CV: no chest pain, palpitations, shortness of breath Abd: has nausea/vomiting, no abdominal pain, diarrhea/constipation GU: no  dysuria, hematuria, polyuria Ext: no arthralgias, myalgias Neuro: has vertigo, dizziness   Past Medical History: Past Medical History:  Diagnosis Date   Allergy    Anemia    Arthritis    Asthma    Diabetes mellitus without complication ([place])    H/O nephrolithotomy with removal of calculi    Hypertension    Hypothyroidism    Osteoporosis    Thyroid disease     Past Surgical History: Past Surgical History:  Procedure Laterality Date   BLADDER SURGERY     BLADDER LIFTED   BREAST BIOPSY Left    neg   BREAST SURGERY     LUMP EXCISION LEFT BREAST.   CESAREAN SECTION     COLONOSCOPY WITH PROPOFOL N/A 04/11/2020   Procedure: COLONOSCOPY WITH PROPOFOL;  Surgeon: Jonathon Bellows, MD;  Location: [facility] ENDOSCOPY;  Service: Endoscopy;  Laterality: N/A;   THYROIDECTOMY     TONSILLECTOMY     TOTAL KNEE ARTHROPLASTY Right 09/02/2020   Procedure: RIGHT TOTAL KNEE ARTHROPLASTY;  Surgeon: Thornton Park, MD;  Location: [facility] ORS;  Service: Orthopedics;  Laterality: Right;   WISDOM TOOTH EXTRACTION       Allergies:   Allergies  Allergen Reactions   Cinnamon     "not good"   Penicillins Hives and Rash     Social History:  reports that she quit smoking about 29 years ago. Her smoking use included cigarettes. She has a 0.75 pack-year smoking history. She has never used smokeless tobacco. She reports current alcohol use. She reports that she does  not use drugs.   Family History: Family History  Problem Relation Age of Onset   Arthritis Mother    Asthma Mother    Hypertension Mother    Cancer Maternal Grandmother        THYRIOD   Breast cancer Neg Hx       Physical Exam: Vitals:   09/03/20 0429 09/03/20 0830 09/03/20 0935 09/03/20 1126  BP: 128/72 103/84 110/73 (!) 106/54  Pulse: 100 87 81 78  Resp: 18 18  18   Temp: 99 F (37.2 C) 98.5 F (36.9 C)  98 F (36.7 C)  TempSrc:  Oral  Oral  SpO2: 90% 96% (!) 89% 93%  Weight:      Height:        General: Not in acute  distress HEENT: PERRL, EOMI, no scleral icterus, No JVD or bruit Cardiac: S1/S2, RRR, No murmurs, gallops or rubs Pulm: Good air movement bilaterally. Clear to auscultation bilaterally. No rales, wheezing, rhonchi or rubs. Abd: Soft, nondistended, nontender, no rebound pain, no organomegaly, BS present Ext: No edema. 2+DP/PT pulse bilaterally Musculoskeletal: s/p of right knee replacement with mild tenderness in surgical site Skin: No rashes.  Neuro: Alert and oriented X3, cranial nerves II-XII grossly intact, moves all extremities. Psych: Patient is not psychotic, no suicidal or hemocidal ideation.      Data reviewed:  I have personally reviewed following labs and imaging studies Labs:  CBC: Recent Labs  Lab 08/28/20 1031 09/02/20 0707 09/03/20 0419  WBC 9.6  --  9.6  NEUTROABS 4.9  --   --   HGB 12.4 11.2* 9.8*  HCT 38.3 33.0* 30.6*  MCV 91.0  --  91.1  PLT 263  --  970    Basic Metabolic Panel: Recent Labs  Lab 08/28/20 1031 09/02/20 0707 09/03/20 0419  NA 138 138 134*  K 3.2* 3.0* 4.0  CL 102 102 105  CO2 28  --  24  GLUCOSE 85 125* 131*  BUN 17 15 18   CREATININE 0.77 1.20* 1.20*  CALCIUM 8.6*  --  7.7*   GFR Estimated Creatinine Clearance: 65.4 mL/min (A) (by C-G formula based on SCr of 1.2 mg/dL (H)). Liver Function Tests: No results for input(s): AST, ALT, ALKPHOS, BILITOT, PROT, ALBUMIN in the last 168 hours. No results for input(s): LIPASE, AMYLASE in the last 168 hours. No results for input(s): AMMONIA in the last 168 hours. Coagulation profile Recent Labs  Lab 08/28/20 1031  INR 0.9    Cardiac Enzymes: No results for input(s): CKTOTAL, CKMB, CKMBINDEX, TROPONINI in the last 168 hours. BNP: Invalid input(s): POCBNP CBG: Recent Labs  Lab 09/02/20 1124 09/03/20 0937  GLUCAP 117* 156*   D-Dimer No results for input(s): DDIMER in the last 72 hours. Hgb A1c No results for input(s): HGBA1C in the last 72 hours. Lipid Profile No results  for input(s): CHOL, HDL, LDLCALC, TRIG, CHOLHDL, LDLDIRECT in the last 72 hours. Thyroid function studies No results for input(s): TSH, T4TOTAL, T3FREE, THYROIDAB in the last 72 hours.  Invalid input(s): FREET3 Anemia work up No results for input(s): VITAMINB12, FOLATE, FERRITIN, TIBC, IRON, RETICCTPCT in the last 72 hours. Urinalysis    Component Value Date/Time   COLORURINE YELLOW (A) 08/28/2020 1031   APPEARANCEUR HAZY (A) 08/28/2020 1031   LABSPEC 1.020 08/28/2020 1031   PHURINE 5.0 08/28/2020 1031   GLUCOSEU NEGATIVE 08/28/2020 1031   HGBUR NEGATIVE 08/28/2020 1031   BILIRUBINUR NEGATIVE 08/28/2020 1031   KETONESUR NEGATIVE 08/28/2020 1031   PROTEINUR  NEGATIVE 08/28/2020 1031   UROBILINOGEN 0.2 09/10/2012 1315   NITRITE NEGATIVE 08/28/2020 1031   LEUKOCYTESUR MODERATE (A) 08/28/2020 1031     Microbiology Recent Results (from the past 240 hour(s))  Surgical pcr screen     Status: None   Collection Time: 08/28/20 10:31 AM   Specimen: Nasal Mucosa; Nasal Swab  Result Value Ref Range Status   MRSA, PCR NEGATIVE NEGATIVE Final   Staphylococcus aureus NEGATIVE NEGATIVE Final    Comment: (NOTE) The Xpert SA Assay (FDA approved for NASAL specimens in patients 32 years of age and older), is one component of a comprehensive surveillance program. It is not intended to diagnose infection nor to guide or monitor treatment. Performed at [facility], [address], [place], [place] [address]   Urine Culture     Status: Abnormal   Collection Time: 08/28/20 10:31 AM   Specimen: Urine, Random  Result Value Ref Range Status   Specimen Description   Final    URINE, RANDOM Performed at [facility], [address], [place], [place] [address]    Special Requests   Final    NONE Performed at [facility], [address] [place], [place] [address]    Culture (A)  Final    >=100,000 COLONIES/mL STREPTOCOCCUS AGALACTIAE TESTING AGAINST S. AGALACTIAE  NOT ROUTINELY PERFORMED DUE TO PREDICTABILITY OF AMP/PEN/VAN SUSCEPTIBILITY. Performed at [facility], [place] [address] [place], [place] [address]    Report Status 08/30/2020 FINAL  Final  SARS CORONAVIRUS 2 (TAT 6-24 HRS) Nasopharyngeal Nasopharyngeal Swab     Status: None   Collection Time: 08/29/20 11:29 AM   Specimen: Nasopharyngeal Swab  Result Value Ref Range Status   SARS Coronavirus 2 NEGATIVE NEGATIVE Final    Comment: (NOTE) SARS-CoV-2 target nucleic acids are NOT DETECTED.  The SARS-CoV-2 RNA is generally detectable in upper and lower respiratory specimens during the acute phase of infection. Negative results do not preclude SARS-CoV-2 infection, do not rule out co-infections with other pathogens, and should not be used as the sole basis for treatment or other patient management decisions. Negative results must be combined with clinical observations, patient history, and epidemiological information. The expected result is Negative.  Fact Sheet for Patients: SugarRoll.be  Fact Sheet for Healthcare Providers: https://www.woods-mathews.com/  This test is not yet approved or cleared by the [place] FDA and  has been authorized for detection and/or diagnosis of SARS-CoV-2 by FDA under an Emergency Use Authorization (EUA). This EUA will remain  in effect (meaning this test can be used) for the duration of the COVID-19 declaration under Se ction 564(b)(1) of the Act, 21 U.S.C. section 360bbb-3(b)(1), unless the authorization is terminated or revoked sooner.  Performed at [facility], [place] [address], [place], [place] [address]        Inpatient Medications:   Scheduled Meds:  amLODipine  5 mg Oral Daily   cholecalciferol  1,000 Units Oral Daily   docusate sodium  100 mg Oral BID   enoxaparin (LOVENOX) injection  40 mg Subcutaneous Q24H   insulin aspart  0-5 Units Subcutaneous QHS   insulin aspart  0-9 Units  Subcutaneous TID WC   ipratropium-albuterol  3 mL Nebulization Q6H   levothyroxine  150 mcg Oral QAC breakfast   mometasone-formoterol  2 puff Inhalation BID   montelukast  10 mg Oral QHS   potassium chloride  10 mEq Oral Daily   traMADol  50 mg Oral Q6H   Continuous  Infusions:  0.9 % NaCl with KCl 20 mEq / L Stopped (09/03/20 0519)   methocarbamol (ROBAXIN) IV     promethazine (PHENERGAN) injection (IM or IVPB)       Radiological Exams on Admission: DG Knee Right Port  Result Date: 09/02/2020 CLINICAL DATA:  Status post right knee replacement today. EXAM: PORTABLE RIGHT KNEE - 1-2 VIEW COMPARISON:  None. FINDINGS: Total arthroplasty is in place. No hardware complication or bony abnormality is seen. Gas in the soft tissues and surgical staples noted. IMPRESSION: Status post right knee replacement.  No acute finding. Electronically Signed   By: Inge Rise M.D.   On: 09/02/2020 12:45    Impression/Recommendations Principal Problem:   S/P TKR (total knee replacement) using cement, right Active Problems:   Hypothyroid   Hypertension   Asthma   Acute renal failure superimposed on stage 2 chronic kidney disease (HCC)   Vertigo   Normocytic anemia   Type II diabetes mellitus with renal manifestations (HCC)   S/P TKR (total knee replacement) using cement, right: -pain management per primary Ortho team -Patient is on Lovenox for DVT prophylaxis  Vertigo: Etiology is not clear. DD would include both central and peripheral vertigo. Will need to r/o posterior circulation stroke for central vertigo. DD for peripheral vertigo would include Mnire's disease, benign paroxysmal positional vertigo and vestibular neuritis (less likely, patient denies recent viral illness)   Plan:  -will treat symptomatically with prn meclizine for dizziness  -NS IV 75 cc/h  -f/u MRI-brain to r/o stroke --> negative for stroke -check orthostatic vital  signs -pt/ot  Hypothyroid -Synthroid  Hypertension: Blood pressure soft at 106/54 -Hold off HCTZ and Cozaar due to worsening renal function -Continue amlodipine  Asthma: Stable.  Current oxygen saturation 98% on room air -Bronchodilators, Singulair -As needed Mucinex  Acute renal failure superimposed on stage 2 chronic kidney disease ([place]): Recent baseline creatinine 0.77 on 08/28/2020.  Her creatinine is 1.20, BUN 18. -IV fluid as above -Hold off HCTZ and Cozaar -Follow-up with BMP -f/u UA  Normocytic anemia: Hemoglobin 11.2 on 09/02/2020.  Her hemoglobin is 9.8 slightly dropped likely due to surgery -Follow-up with CBC  Type II diabetes mellitus with renal manifestations: Recent A1c 6.2, well controlled.  Patient taking metformin -Sliding scale insulin        Thank you for this consultation.  Our [facility] hospitalist team will follow the patient with you.   Time Spent: 20 min  Ivor Costa M.D. [facility] 09/03/2020, 3:20 PM

## 2020-09-03 NOTE — Anesthesia Postprocedure Evaluation (Signed)
Anesthesia Post Note  Patient: Katherine Estes  Procedure(s) Performed: RIGHT TOTAL KNEE ARTHROPLASTY (Right: Knee)  Anesthesia Type: Spinal Level of consciousness: awake, oriented and awake and alert Pain management: pain level controlled Vital Signs Assessment: post-procedure vital signs reviewed and stable Respiratory status: spontaneous breathing, nonlabored ventilation and respiratory function stable Cardiovascular status: blood pressure returned to baseline and stable Postop Assessment: no headache and no backache Comments: Pt states right foot was slow coming back. OK now.   No notable events documented.   Last Vitals:  Vitals:   09/03/20 0830 09/03/20 0935  BP: 103/84 110/73  Pulse: 87 81  Resp: 18   Temp: 36.9 C   SpO2: 96% (!) 89%    Last Pain:  Vitals:   09/03/20 0830  TempSrc: Oral  PainSc:                  Johnna Acosta

## 2020-09-04 ENCOUNTER — Observation Stay: Payer: Medicaid Other

## 2020-09-04 DIAGNOSIS — I10 Essential (primary) hypertension: Secondary | ICD-10-CM | POA: Diagnosis not present

## 2020-09-04 DIAGNOSIS — Z20822 Contact with and (suspected) exposure to covid-19: Secondary | ICD-10-CM | POA: Diagnosis present

## 2020-09-04 DIAGNOSIS — Z6841 Body Mass Index (BMI) 40.0 and over, adult: Secondary | ICD-10-CM | POA: Diagnosis not present

## 2020-09-04 DIAGNOSIS — R5082 Postprocedural fever: Secondary | ICD-10-CM | POA: Diagnosis not present

## 2020-09-04 DIAGNOSIS — E876 Hypokalemia: Secondary | ICD-10-CM | POA: Diagnosis present

## 2020-09-04 DIAGNOSIS — Z79899 Other long term (current) drug therapy: Secondary | ICD-10-CM | POA: Diagnosis not present

## 2020-09-04 DIAGNOSIS — N179 Acute kidney failure, unspecified: Secondary | ICD-10-CM | POA: Diagnosis present

## 2020-09-04 DIAGNOSIS — Z7984 Long term (current) use of oral hypoglycemic drugs: Secondary | ICD-10-CM | POA: Diagnosis not present

## 2020-09-04 DIAGNOSIS — Z96651 Presence of right artificial knee joint: Secondary | ICD-10-CM | POA: Diagnosis not present

## 2020-09-04 DIAGNOSIS — Z7951 Long term (current) use of inhaled steroids: Secondary | ICD-10-CM | POA: Diagnosis not present

## 2020-09-04 DIAGNOSIS — K219 Gastro-esophageal reflux disease without esophagitis: Secondary | ICD-10-CM | POA: Diagnosis present

## 2020-09-04 DIAGNOSIS — Z880 Allergy status to penicillin: Secondary | ICD-10-CM | POA: Diagnosis not present

## 2020-09-04 DIAGNOSIS — J45909 Unspecified asthma, uncomplicated: Secondary | ICD-10-CM | POA: Diagnosis present

## 2020-09-04 DIAGNOSIS — I129 Hypertensive chronic kidney disease with stage 1 through stage 4 chronic kidney disease, or unspecified chronic kidney disease: Secondary | ICD-10-CM | POA: Diagnosis present

## 2020-09-04 DIAGNOSIS — M1711 Unilateral primary osteoarthritis, right knee: Secondary | ICD-10-CM | POA: Diagnosis present

## 2020-09-04 DIAGNOSIS — Z87891 Personal history of nicotine dependence: Secondary | ICD-10-CM | POA: Diagnosis not present

## 2020-09-04 DIAGNOSIS — E890 Postprocedural hypothyroidism: Secondary | ICD-10-CM | POA: Diagnosis present

## 2020-09-04 DIAGNOSIS — E1122 Type 2 diabetes mellitus with diabetic chronic kidney disease: Secondary | ICD-10-CM | POA: Diagnosis present

## 2020-09-04 DIAGNOSIS — Z79890 Hormone replacement therapy (postmenopausal): Secondary | ICD-10-CM | POA: Diagnosis not present

## 2020-09-04 DIAGNOSIS — R112 Nausea with vomiting, unspecified: Secondary | ICD-10-CM | POA: Diagnosis not present

## 2020-09-04 DIAGNOSIS — R42 Dizziness and giddiness: Secondary | ICD-10-CM | POA: Diagnosis not present

## 2020-09-04 DIAGNOSIS — R509 Fever, unspecified: Secondary | ICD-10-CM | POA: Diagnosis not present

## 2020-09-04 DIAGNOSIS — D649 Anemia, unspecified: Secondary | ICD-10-CM | POA: Diagnosis present

## 2020-09-04 DIAGNOSIS — N182 Chronic kidney disease, stage 2 (mild): Secondary | ICD-10-CM | POA: Diagnosis present

## 2020-09-04 DIAGNOSIS — R32 Unspecified urinary incontinence: Secondary | ICD-10-CM | POA: Diagnosis not present

## 2020-09-04 LAB — URINALYSIS, COMPLETE (UACMP) WITH MICROSCOPIC
Bilirubin Urine: NEGATIVE
Glucose, UA: 50 mg/dL — AB
Ketones, ur: NEGATIVE mg/dL
Leukocytes,Ua: NEGATIVE
Nitrite: NEGATIVE
Protein, ur: 30 mg/dL — AB
Specific Gravity, Urine: 1.018 (ref 1.005–1.030)
pH: 5 (ref 5.0–8.0)

## 2020-09-04 LAB — CBC
HCT: 31.2 % — ABNORMAL LOW (ref 36.0–46.0)
Hemoglobin: 10.1 g/dL — ABNORMAL LOW (ref 12.0–15.0)
MCH: 29.4 pg (ref 26.0–34.0)
MCHC: 32.4 g/dL (ref 30.0–36.0)
MCV: 91 fL (ref 80.0–100.0)
Platelets: 208 10*3/uL (ref 150–400)
RBC: 3.43 MIL/uL — ABNORMAL LOW (ref 3.87–5.11)
RDW: 14.6 % (ref 11.5–15.5)
WBC: 12 10*3/uL — ABNORMAL HIGH (ref 4.0–10.5)
nRBC: 0 % (ref 0.0–0.2)

## 2020-09-04 LAB — GLUCOSE, CAPILLARY
Glucose-Capillary: 104 mg/dL — ABNORMAL HIGH (ref 70–99)
Glucose-Capillary: 110 mg/dL — ABNORMAL HIGH (ref 70–99)
Glucose-Capillary: 141 mg/dL — ABNORMAL HIGH (ref 70–99)
Glucose-Capillary: 168 mg/dL — ABNORMAL HIGH (ref 70–99)
Glucose-Capillary: 178 mg/dL — ABNORMAL HIGH (ref 70–99)

## 2020-09-04 LAB — BASIC METABOLIC PANEL
Anion gap: 8 (ref 5–15)
BUN: 14 mg/dL (ref 6–20)
CO2: 24 mmol/L (ref 22–32)
Calcium: 8.3 mg/dL — ABNORMAL LOW (ref 8.9–10.3)
Chloride: 102 mmol/L (ref 98–111)
Creatinine, Ser: 0.9 mg/dL (ref 0.44–1.00)
GFR, Estimated: >60 mL/min (ref >60)
Glucose, Bld: 121 mg/dL — ABNORMAL HIGH (ref 70–99)
Potassium: 3.7 mmol/L (ref 3.5–5.1)
Sodium: 134 mmol/L — ABNORMAL LOW (ref 135–145)

## 2020-09-04 LAB — PROCALCITONIN: Procalcitonin: 0.33 ng/mL

## 2020-09-04 MED ORDER — IPRATROPIUM-ALBUTEROL 0.5-2.5 (3) MG/3ML IN SOLN: 3.0000 mL | RESPIRATORY_TRACT | Status: DC | PRN

## 2020-09-04 MED ORDER — MIDAZOLAM HCL 2 MG/2ML IJ SOLN
INTRAMUSCULAR | Status: AC
  Filled 2020-09-04: qty 2

## 2020-09-04 MED ORDER — FUROSEMIDE 20 MG PO TABS
20.0000 mg | ORAL_TABLET | Freq: Once | ORAL | Status: AC
  Administered 2020-09-04: 20 mg via ORAL
  Filled 2020-09-04: qty 1

## 2020-09-04 NOTE — Progress Notes (Signed)
Subjective:  POD #2 s/p right TKA.   Patient reports right knee pain as mild to moderate.  Patient states her nausea and vomiting are improved.  Dizziness is improving.   Patient stated on meclizine yesterday.  Patient questions whether tramadol may have contributed to her symptoms.    Objective:   VITALS:   Vitals:   09/04/20 0325 09/04/20 0806 09/04/20 0820 09/04/20 1209  BP: 125/65  137/71 117/69  Pulse: 91 94 99 97  Resp: 18 20 18 18   Temp: 99.1 F (37.3 C)  99.2 F (37.3 C) 98.2 F (36.8 C)  TempSrc:      SpO2: 95% 94% 97% 98%  Weight:      Height:        PHYSICAL EXAM: RIght lower extremity: I personally changed the patient's dressing today.  Scan drainage on aquacel dressing which was left in place today and overwrapped with a new ACE wrap. Neurovascular intact Sensation intact distally Intact pulses distally Dorsiflexion/Plantar flexion intact Incision: scant drainage No cellulitis present Compartment soft  LABS  Results for orders placed or performed during the hospital encounter of 09/02/20 (from the past 24 hour(s))  Glucose, capillary     Status: Abnormal   Collection Time: 09/04/20 12:57 AM  Result Value Ref Range   Glucose-Capillary 104 (H) 70 - 99 mg/dL   Comment 1 Notify RN   CBC     Status: Abnormal   Collection Time: 09/04/20  4:01 AM  Result Value Ref Range   WBC 12.0 (H) 4.0 - 10.5 K/uL   RBC 3.43 (L) 3.87 - 5.11 MIL/uL   Hemoglobin 10.1 (L) 12.0 - 15.0 g/dL   HCT 31.2 (L) 36.0 - 46.0 %   MCV 91.0 80.0 - 100.0 fL   MCH 29.4 26.0 - 34.0 pg   MCHC 32.4 30.0 - 36.0 g/dL   RDW 14.6 11.5 - 15.5 %   Platelets 208 150 - 400 K/uL   nRBC 0.0 0.0 - 0.2 %  Basic metabolic panel     Status: Abnormal   Collection Time: 09/04/20  4:01 AM  Result Value Ref Range   Sodium 134 (L) 135 - 145 mmol/L   Potassium 3.7 3.5 - 5.1 mmol/L   Chloride 102 98 - 111 mmol/L   CO2 24 22 - 32 mmol/L   Glucose, Bld 121 (H) 70 - 99 mg/dL   BUN 14 6 - 20 mg/dL    Creatinine, Ser 0.90 0.44 - 1.00 mg/dL   Calcium 8.3 (L) 8.9 - 10.3 mg/dL   GFR, Estimated >60 >60 mL/min   Anion gap 8 5 - 15  Procalcitonin - Baseline     Status: None   Collection Time: 09/04/20  4:01 AM  Result Value Ref Range   Procalcitonin 0.33 ng/mL  Glucose, capillary     Status: Abnormal   Collection Time: 09/04/20  9:44 AM  Result Value Ref Range   Glucose-Capillary 178 (H) 70 - 99 mg/dL  Glucose, capillary     Status: Abnormal   Collection Time: 09/04/20 12:59 PM  Result Value Ref Range   Glucose-Capillary 110 (H) 70 - 99 mg/dL    MR BRAIN WO CONTRAST  Result Date: 09/03/2020 CLINICAL DATA:  Dizziness. EXAM: MRI HEAD WITHOUT CONTRAST TECHNIQUE: Multiplanar, multiecho pulse sequences of the brain and surrounding structures were obtained without intravenous contrast. COMPARISON:  None. FINDINGS: Intermittently motion limited study.  Within this limitation: Brain: No acute infarction, hemorrhage, hydrocephalus, extra-axial collection or mass lesion. Vascular: Major arterial  flow voids are maintained at the skull base. Skull and upper cervical spine: Normal marrow signal. Sinuses/Orbits: Mild ethmoid air cell mucosal thickening. Otherwise, clear sinuses. Unremarkable orbits. Other: No mastoid effusions. IMPRESSION: No evidence of acute intracranial abnormality on this motion limited study. Electronically Signed   By: Margaretha Sheffield MD   On: 09/03/2020 15:27   DG Chest Port 1 View  Result Date: 09/04/2020 CLINICAL DATA:  Fever.  Shortness of breath. EXAM: PORTABLE CHEST 1 VIEW COMPARISON:  Aug 14, 2020. FINDINGS: Enlarged cardiac silhouette. Prominent right paratracheal stripe likely related to vascular structure accentuated by patient rotation. Pulmonary vascular congestion no consolidation. No visible pleural effusions or pneumothorax on this single AP radiograph. IMPRESSION: Cardiomegaly and pulmonary vascular congestion without overt pulmonary edema. Electronically Signed   By:  Margaretha Sheffield MD   On: 09/04/2020 08:14    Assessment/Plan: 2 Days Post-Op   Principal Problem:   S/P TKR (total knee replacement) using cement, right Active Problems:   Hypothyroid   Hypertension   Asthma   Acute renal failure superimposed on stage 2 chronic kidney disease (HCC)   Vertigo   Normocytic anemia   Type II diabetes mellitus with renal manifestations ([place])  Patient improving.   MRI negative for stroke.  CXR shows increased pulmonary congestion without consolidation.  No pneumonia.  Continue with PT.  Appreciate hospitalist consult. Hemoglobin and hematocrit are stable.  Vitals are stable.    Thornton Park , MD 09/04/2020, 2:30 PM

## 2020-09-04 NOTE — Progress Notes (Signed)
Physical Therapy Treatment Patient Details Name: Katherine Estes MRN: [id number] DOB: Mar 13, 1961 Today's Date: 09/04/2020    History of Present Illness Pt is s/p R TKA on 09/02/20.    PT Comments    Pt received in Semi-Fowler's position and agreeable to therapy.  Pt requested to use the restroom and was able to transfer to the bedside commode.  Pt was unable to make it to the commode before urinating in her brief.  Pt was cleaned and then requested inhaler to assist with breathing.  Pt then able to ambulate down the hallway ~120 feet before needing to sit in recliner and be transferred back to room.  Pt is not progressing well and d/c option were discussed with pt and recommendation of SNF due to lack of progress.  Pt agreed however needs to speak with case manager about specifics going forward.  Will re-attempt session in AM to see progress after getting rest.       Follow Up Recommendations  SNF     Equipment Recommendations  Rolling walker with 5" wheels;3in1 (PT)    Recommendations for Other Services       Precautions / Restrictions Precautions Precautions: Knee;Fall Required Braces or Orthoses: Knee Immobilizer - Right Restrictions Weight Bearing Restrictions: Yes RLE Weight Bearing: Weight bearing as tolerated    Mobility  Bed Mobility Overal bed mobility: Modified Independent;Needs Assistance Bed Mobility: Sit to Supine       Sit to supine: Mod assist   General bed mobility comments: Navigation of the R LE into bed.    Transfers Overall transfer level: Needs assistance Equipment used: Rolling walker (2 wheeled) Transfers: Sit to/from Stand   Stand pivot transfers: Min guard          Ambulation/Gait Ambulation/Gait assistance: Modified independent (Device/Increase time) Gait Distance (Feet): 100 Feet Assistive device: Rolling walker (2 wheeled) Gait Pattern/deviations: Step-through pattern;Decreased step length - left;Decreased stance time -  right;Decreased stride length;Step-to pattern Gait velocity: decreased   General Gait Details: Pt unable to ambulate further this PM session due to fatigue.   Stairs             Wheelchair Mobility    Modified Rankin (Stroke Patients Only)       Balance Overall balance assessment: Modified Independent                                          Cognition Arousal/Alertness: Awake/alert Behavior During Therapy: WFL for tasks assessed/performed Overall Cognitive Status: Within Functional Limits for tasks assessed                                        Exercises Total Joint Exercises Ankle Circles/Pumps: AROM;Strengthening;Both;10 reps;Supine Quad Sets: AROM;Strengthening;Both;10 reps;Supine Gluteal Sets: AROM;Strengthening;Both;10 reps;Supine Heel Slides: AROM;Strengthening;Both;10 reps;Supine Hip ABduction/ADduction: AROM;Strengthening;Both;10 reps;Supine Straight Leg Raises: AROM;Strengthening;Both;10 reps;Supine    General Comments        Pertinent Vitals/Pain Pain Assessment: 0-10 Pain Score: 6  Pain Location: Medial R Knee Pain Descriptors / Indicators: Throbbing;Grimacing;Guarding;Sharp Pain Intervention(s): Limited activity within patient's tolerance;Monitored during session;Premedicated before session;Repositioned;Ice applied    Home Living                      Prior Function  PT Goals (current goals can now be found in the care plan section) Acute Rehab PT Goals Patient Stated Goal: To go home PT Goal Formulation: With patient Time For Goal Achievement: 09/16/20 Potential to Achieve Goals: Good Progress towards PT goals: Not progressing toward goals - comment    Frequency    BID      PT Plan Discharge plan needs to be updated    Co-evaluation              AM-PAC PT "6 Clicks" Mobility   Outcome Measure  Help needed turning from your back to your side while in a flat bed  without using bedrails?: None Help needed moving from lying on your back to sitting on the side of a flat bed without using bedrails?: None Help needed moving to and from a bed to a chair (including a wheelchair)?: A Little Help needed standing up from a chair using your arms (e.g., wheelchair or bedside chair)?: A Little Help needed to walk in hospital room?: A Lot Help needed climbing 3-5 steps with a railing? : Total 6 Click Score: 17    End of Session Equipment Utilized During Treatment: Gait belt Activity Tolerance: Patient tolerated treatment well Patient left: with call bell/phone within reach;in bed;with bed alarm set Nurse Communication: Mobility status PT Visit Diagnosis: Unsteadiness on feet (R26.81);Other abnormalities of gait and mobility (R26.89);Muscle weakness (generalized) (M62.81);Difficulty in walking, not elsewhere classified (R26.2);Pain Pain - Right/Left: Right Pain - part of body: Knee     Time: 9470-9628 PT Time Calculation (min) (ACUTE ONLY): 48 min  Charges:  $Gait Training: 23-37 mins $Self Care/Home Management: 8-22                     Gwenlyn Saran, PT, DPT 09/04/20, 4:46 PM    [name] 09/04/2020, 4:45 PM

## 2020-09-04 NOTE — Progress Notes (Signed)
Physical Therapy Treatment Patient Details Name: Katherine Estes MRN: [id number] DOB: Jul 18, 1960 Today's Date: 09/04/2020    History of Present Illness Pt is s/p R TKA on 09/02/20.     Pt received seated in recliner upon arrival to room.  Pt agreeable to therapy.  Pt reports that she had accidentally urinated on herself and on the floor and was needing assistance.  Therapist aided in providing self-care at initial portion of treatment session while environmental team assisted in cleaning the room.  Pt was able to perform transfer into standing well, and was able to ambulate down hallway and back to room ~100 ft total.  Pt with very labored gait and sweating throughout session.  Pt then transferred back to bed where pt needed modA to allow for clearance of the R LE into the bed.  Pt left with all needs met and call bell within reach.  Will attempt furthering ambulation goal during PM session.  Current discharge plans to HHPT remain appropriate at this time, as long as pt continues to progress.  Pt will continue to benefit from skilled therapy in order to address deficits listed below.     Follow Up Recommendations  Home health PT     Equipment Recommendations  Rolling walker with 5" wheels;3in1 (PT)    Recommendations for Other Services       Precautions / Restrictions Precautions Precautions: Knee;Fall Required Braces or Orthoses: Knee Immobilizer - Right Restrictions Weight Bearing Restrictions: Yes RLE Weight Bearing: Weight bearing as tolerated    Mobility  Bed Mobility Overal bed mobility: Modified Independent             General bed mobility comments: Extra time necessary    Transfers Overall transfer level: Needs assistance Equipment used: Rolling walker (2 wheeled) Transfers: Sit to/from Stand   Stand pivot transfers: Min guard          Ambulation/Gait Ambulation/Gait assistance: Modified independent (Device/Increase time) Gait Distance (Feet): 100  Feet Assistive device: Rolling walker (2 wheeled) Gait Pattern/deviations: Step-through pattern;Decreased step length - left;Decreased stance time - right;Decreased stride length;Step-to pattern Gait velocity: decreased   General Gait Details: Pt able to progress well with ambulation, however due to feeling nauseous, unable to ambulate greater distances today.   Stairs             Wheelchair Mobility    Modified Rankin (Stroke Patients Only)       Balance Overall balance assessment: Modified Independent                                          Cognition Arousal/Alertness: Awake/alert Behavior During Therapy: WFL for tasks assessed/performed Overall Cognitive Status: Within Functional Limits for tasks assessed                                        Exercises      General Comments        Pertinent Vitals/Pain Pain Assessment: 0-10 Pain Score: 6  Pain Location: Medial R Knee Pain Descriptors / Indicators: Throbbing;Grimacing;Guarding;Sharp Pain Intervention(s): Limited activity within patient's tolerance;Monitored during session;Premedicated before session;Repositioned    Home Living                      Prior Function  PT Goals (current goals can now be found in the care plan section) Acute Rehab PT Goals Patient Stated Goal: To go home PT Goal Formulation: With patient Time For Goal Achievement: 09/16/20 Potential to Achieve Goals: Good Progress towards PT goals: Not progressing toward goals - comment (Due to inability to ambulate yesterday for both sessions due to nausea, pt has regressed since initial evaluation.)    Frequency    BID      PT Plan Current plan remains appropriate    Co-evaluation              AM-PAC PT "6 Clicks" Mobility   Outcome Measure  Help needed turning from your back to your side while in a flat bed without using bedrails?: None Help needed moving from  lying on your back to sitting on the side of a flat bed without using bedrails?: None Help needed moving to and from a bed to a chair (including a wheelchair)?: A Little Help needed standing up from a chair using your arms (e.g., wheelchair or bedside chair)?: A Little Help needed to walk in hospital room?: A Lot Help needed climbing 3-5 steps with a railing? : Total 6 Click Score: 17    End of Session Equipment Utilized During Treatment: Gait belt Activity Tolerance: Patient tolerated treatment well Patient left: with call bell/phone within reach;in bed;with bed alarm set Nurse Communication: Mobility status PT Visit Diagnosis: Unsteadiness on feet (R26.81);Other abnormalities of gait and mobility (R26.89);Muscle weakness (generalized) (M62.81);Difficulty in walking, not elsewhere classified (R26.2);Pain Pain - Right/Left: Right Pain - part of body: Knee     Time: 6886-4847 PT Time Calculation (min) (ACUTE ONLY): 57 min  Charges:  $Gait Training: 38-52 mins $Self Care/Home Management: 8-22                     Gwenlyn Saran, PT, DPT 09/04/20, 2:47 PM    [name] 09/04/2020, 2:43 PM

## 2020-09-04 NOTE — Progress Notes (Addendum)
PROGRESS NOTE    Katherine Estes  CHE:[id number] DOB: 10/23/1960 DOA: 09/02/2020 PCP: [facility] [employer]    No chief complaint on file.   Brief Narrative:   Katherine Estes is an 60 y.o. female hypertension, diabetes mellitus, asthma, anemia, kidney stone, CKD stage II, hypothyroidism, who was admitted by Dr. Mack Guise of Ortho for right knee replacement on 6/14. Pt developed vertigo after surgery.  So [facility] hospitalist consulted to evaluate, MRI with no acute findings, her symptoms has resolved.  Assessment & Plan:   Principal Problem:   S/P TKR (total knee replacement) using cement, right Active Problems:   Hypothyroid   Hypertension   Asthma   Acute renal failure superimposed on stage 2 chronic kidney disease (HCC)   Vertigo   Normocytic anemia   Type II diabetes mellitus with renal manifestations (HCC)     S/P TKR (total knee replacement) using cement, right: -pain management per primary Ortho team -Patient is on Lovenox for DVT prophylaxis   Vertigo:  -This appears to be has resolved, improved with IV fluids, holding antihypertensive medications, MRI brain with no acute findings.   -Stopped her fluids this morning as some vascular congestion on chest x-ray, will give her  low-dose Lasix as well.  Fever of 100.5 overnight -Is nontoxic-appearing, UA is negative, no evidence of pneumonia on chest x-ray, Pro-Cal is reassuring at 0.3, was encouraged to use incentive spirometer.  Hypothyroid -Synthroid   Hypertension:  -Blood pressure is soft, hydrochlorothiazide and Cozaar has been on hold, I will go ahead and discontinue her amlodipine and keep her only on as needed hydralazine.     Asthma: Stable.  Current oxygen saturation 98% on room air -Bronchodilators, Singulair -As needed Mucinex   Acute renal failure superimposed on stage 2 chronic kidney disease ([place]):  -Recent baseline creatinine 0.77 on 08/28/2020.  Her creatinine is 1.20, BUN 18. -Hold  off HCTZ and Cozaar   Normocytic anemia: Hemoglobin 11.2 on 09/02/2020.   -Follow-up with CBC   Type II diabetes mellitus with renal manifestations: Recent A1c 6.2, well controlled.  Patient taking metformin -Sliding scale insulin             DVT prophylaxis: Lovenox Code Status: Full Family Communication: none at bedside Disposition: Per primary orthopedic team       Consultants:  [facility] for medical management   Subjective:  Patient with fever 100.5 overnight, she denies any cough or dysuria.  No dyspnea at rest, but dyspnea with activity, no orthopnea.  Objective: Vitals:   09/04/20 0325 09/04/20 0806 09/04/20 0820 09/04/20 1209  BP: 125/65  137/71 117/69  Pulse: 91 94 99 97  Resp: 18 20 18 18   Temp: 99.1 F (37.3 C)  99.2 F (37.3 C) 98.2 F (36.8 C)  TempSrc:      SpO2: 95% 94% 97% 98%  Weight:      Height:        Intake/Output Summary (Last 24 hours) at 09/04/2020 1418 Last data filed at 09/04/2020 0641 Gross per 24 hour  Intake 973.8 ml  Output --  Net 973.8 ml   Filed Weights   09/02/20 0703  Weight: 116.4 kg    Examination:  Awake Alert, Oriented X 3, No new F.N deficits, Normal affect Symmetrical Chest wall movement, diminished air entry at the bases, no wheezing, no rhonchi RRR,No Gallops,Rubs or new Murmurs, No Parasternal Heave +ve B.Sounds, Abd Soft, No tenderness, No rebound - guarding or rigidity. Right lower extremity and immobilizer  with cooling device    Data Reviewed: I have personally reviewed following labs and imaging studies  CBC: Recent Labs  Lab 09/02/20 0707 09/03/20 0419 09/04/20 0401  WBC  --  9.6 12.0*  HGB 11.2* 9.8* 10.1*  HCT 33.0* 30.6* 31.2*  MCV  --  91.1 91.0  PLT  --  195 664    Basic Metabolic Panel: Recent Labs  Lab 09/02/20 0707 09/03/20 0419 09/04/20 0401  NA 138 134* 134*  K 3.0* 4.0 3.7  CL 102 105 102  CO2  --  24 24  GLUCOSE 125* 131* 121*  BUN 15 18 14   CREATININE 1.20*  1.20* 0.90  CALCIUM  --  7.7* 8.3*    GFR: Estimated Creatinine Clearance: 87.2 mL/min (by C-G formula based on SCr of 0.9 mg/dL).  Liver Function Tests: No results for input(s): AST, ALT, ALKPHOS, BILITOT, PROT, ALBUMIN in the last 168 hours.  CBG: Recent Labs  Lab 09/02/20 1124 09/03/20 0937 09/04/20 0057 09/04/20 0944 09/04/20 1259  GLUCAP 117* 156* 104* 178* 110*     Recent Results (from the past 240 hour(s))  Surgical pcr screen     Status: None   Collection Time: 08/28/20 10:31 AM   Specimen: Nasal Mucosa; Nasal Swab  Result Value Ref Range Status   MRSA, PCR NEGATIVE NEGATIVE Final   Staphylococcus aureus NEGATIVE NEGATIVE Final    Comment: (NOTE) The Xpert SA Assay (FDA approved for NASAL specimens in patients 5 years of age and older), is one component of a comprehensive surveillance program. It is not intended to diagnose infection nor to guide or monitor treatment. Performed at [facility], [address], [place], [place] [address]   Urine Culture     Status: Abnormal   Collection Time: 08/28/20 10:31 AM   Specimen: Urine, Random  Result Value Ref Range Status   Specimen Description   Final    URINE, RANDOM Performed at [facility], [address], [place], [place] [address]    Special Requests   Final    NONE Performed at [facility], [address] [place], [place] [address]    Culture (A)  Final    >=100,000 COLONIES/mL STREPTOCOCCUS AGALACTIAE TESTING AGAINST S. AGALACTIAE NOT ROUTINELY PERFORMED DUE TO PREDICTABILITY OF AMP/PEN/VAN SUSCEPTIBILITY. Performed at [facility], [place] [address] [place], [place] [address]    Report Status 08/30/2020 FINAL  Final  SARS CORONAVIRUS 2 (TAT 6-24 HRS) Nasopharyngeal Nasopharyngeal Swab     Status: None   Collection Time: 08/29/20 11:29 AM   Specimen: Nasopharyngeal Swab  Result Value Ref Range Status   SARS Coronavirus 2 NEGATIVE NEGATIVE Final    Comment:  (NOTE) SARS-CoV-2 target nucleic acids are NOT DETECTED.  The SARS-CoV-2 RNA is generally detectable in upper and lower respiratory specimens during the acute phase of infection. Negative results do not preclude SARS-CoV-2 infection, do not rule out co-infections with other pathogens, and should not be used as the sole basis for treatment or other patient management decisions. Negative results must be combined with clinical observations, patient history, and epidemiological information. The expected result is Negative.  Fact Sheet for Patients: SugarRoll.be  Fact Sheet for Healthcare Providers: https://www.woods-mathews.com/  This test is not yet approved or cleared by the [place] FDA and  has been authorized for detection and/or diagnosis of SARS-CoV-2 by FDA under an Emergency Use Authorization (EUA). This EUA will remain  in effect (meaning this test can be used) for the  duration of the COVID-19 declaration under Se ction 564(b)(1) of the Act, 21 U.S.C. section 360bbb-3(b)(1), unless the authorization is terminated or revoked sooner.  Performed at [facility], [place] [address] [place], [place] [address]          Radiology Studies: MR BRAIN WO CONTRAST  Result Date: 09/03/2020 CLINICAL DATA:  Dizziness. EXAM: MRI HEAD WITHOUT CONTRAST TECHNIQUE: Multiplanar, multiecho pulse sequences of the brain and surrounding structures were obtained without intravenous contrast. COMPARISON:  None. FINDINGS: Intermittently motion limited study.  Within this limitation: Brain: No acute infarction, hemorrhage, hydrocephalus, extra-axial collection or mass lesion. Vascular: Major arterial flow voids are maintained at the skull base. Skull and upper cervical spine: Normal marrow signal. Sinuses/Orbits: Mild ethmoid air cell mucosal thickening. Otherwise, clear sinuses. Unremarkable orbits. Other: No mastoid effusions. IMPRESSION: No evidence  of acute intracranial abnormality on this motion limited study. Electronically Signed   By: Margaretha Sheffield MD   On: 09/03/2020 15:27   DG Chest Port 1 View  Result Date: 09/04/2020 CLINICAL DATA:  Fever.  Shortness of breath. EXAM: PORTABLE CHEST 1 VIEW COMPARISON:  Aug 14, 2020. FINDINGS: Enlarged cardiac silhouette. Prominent right paratracheal stripe likely related to vascular structure accentuated by patient rotation. Pulmonary vascular congestion no consolidation. No visible pleural effusions or pneumothorax on this single AP radiograph. IMPRESSION: Cardiomegaly and pulmonary vascular congestion without overt pulmonary edema. Electronically Signed   By: Margaretha Sheffield MD   On: 09/04/2020 08:14        Scheduled Meds:  amLODipine  5 mg Oral Daily   cholecalciferol  1,000 Units Oral Daily   docusate sodium  100 mg Oral BID   enoxaparin (LOVENOX) injection  40 mg Subcutaneous Q24H   insulin aspart  0-5 Units Subcutaneous QHS   insulin aspart  0-9 Units Subcutaneous TID WC   levothyroxine  150 mcg Oral QAC breakfast   midazolam       mometasone-formoterol  2 puff Inhalation BID   montelukast  10 mg Oral QHS   potassium chloride  10 mEq Oral Daily   traMADol  50 mg Oral Q6H   Continuous Infusions:  sodium chloride 75 mL/hr at 09/04/20 0641   methocarbamol (ROBAXIN) IV     promethazine (PHENERGAN) injection (IM or IVPB)       LOS: 1 day       Phillips Climes, MD [facility]   To contact the attending provider between 7A-7P or the covering provider during after hours 7P-7A, please log into the web site [email or web address] and access using universal [place] password for that web site. If you do not have the password, please call the hospital operator.  09/04/2020, 2:18 PM

## 2020-09-05 DIAGNOSIS — I10 Essential (primary) hypertension: Secondary | ICD-10-CM | POA: Diagnosis not present

## 2020-09-05 DIAGNOSIS — J45909 Unspecified asthma, uncomplicated: Secondary | ICD-10-CM | POA: Diagnosis not present

## 2020-09-05 DIAGNOSIS — Z96651 Presence of right artificial knee joint: Secondary | ICD-10-CM | POA: Diagnosis not present

## 2020-09-05 LAB — CBC
HCT: 27.8 % — ABNORMAL LOW (ref 36.0–46.0)
Hemoglobin: 9.2 g/dL — ABNORMAL LOW (ref 12.0–15.0)
MCH: 29.6 pg (ref 26.0–34.0)
MCHC: 33.1 g/dL (ref 30.0–36.0)
MCV: 89.4 fL (ref 80.0–100.0)
Platelets: 229 10*3/uL (ref 150–400)
RBC: 3.11 MIL/uL — ABNORMAL LOW (ref 3.87–5.11)
RDW: 14.6 % (ref 11.5–15.5)
WBC: 11.3 10*3/uL — ABNORMAL HIGH (ref 4.0–10.5)
nRBC: 0 % (ref 0.0–0.2)

## 2020-09-05 LAB — PROCALCITONIN: Procalcitonin: 0.27 ng/mL

## 2020-09-05 LAB — GLUCOSE, CAPILLARY
Glucose-Capillary: 116 mg/dL — ABNORMAL HIGH (ref 70–99)
Glucose-Capillary: 154 mg/dL — ABNORMAL HIGH (ref 70–99)
Glucose-Capillary: 97 mg/dL (ref 70–99)
Glucose-Capillary: 108 mg/dL — ABNORMAL HIGH (ref 70–99)
Glucose-Capillary: 170 mg/dL — ABNORMAL HIGH (ref 70–99)

## 2020-09-05 LAB — BASIC METABOLIC PANEL
Anion gap: 7 (ref 5–15)
BUN: 10 mg/dL (ref 6–20)
CO2: 27 mmol/L (ref 22–32)
Calcium: 8.2 mg/dL — ABNORMAL LOW (ref 8.9–10.3)
Chloride: 103 mmol/L (ref 98–111)
Creatinine, Ser: 0.8 mg/dL (ref 0.44–1.00)
GFR, Estimated: >60 mL/min (ref >60)
Glucose, Bld: 128 mg/dL — ABNORMAL HIGH (ref 70–99)
Potassium: 3.4 mmol/L — ABNORMAL LOW (ref 3.5–5.1)
Sodium: 137 mmol/L (ref 135–145)

## 2020-09-05 MED ORDER — POTASSIUM CHLORIDE CRYS ER 20 MEQ PO TBCR
30.0000 meq | EXTENDED_RELEASE_TABLET | Freq: Four times a day (QID) | ORAL | Status: AC
  Administered 2020-09-05 (×2): 30 meq via ORAL
  Filled 2020-09-05 (×2): qty 1

## 2020-09-05 NOTE — Progress Notes (Signed)
Subjective:  POD #3 s/p right total knee arthroplasty.   Patient reports right knee pain as mild to moderate.  Patient up out of bed to a chair.  Patient states that she has difficulty feeling movement of her third through fifth toes on the right foot.  Patient evaluated by medicine yesterday and given a dose of Lasix.  Physical therapist today states that patient has had urinary incontinence.  Objective:   VITALS:   Vitals:   09/05/20 0348 09/05/20 0753 09/05/20 1142 09/05/20 1143  BP: 135/69 124/65 121/72 121/72  Pulse: 98 97 91 91  Resp: 18 17 17 17   Temp: 99 F (37.2 C) 98.7 F (37.1 C) 98.3 F (36.8 C) 98.3 F (36.8 C)  TempSrc:      SpO2: 98% 96%  96%  Weight:      Height:        PHYSICAL EXAM: Right lower extremity Neurovascular intact Sensation intact distally Intact pulses distally Dorsiflexion/Plantar flexion intact Dressing: scant drainage.  No change from yesterday. No cellulitis present Compartment soft  LABS  Results for orders placed or performed during the hospital encounter of 09/02/20 (from the past 24 hour(s))  Glucose, capillary     Status: Abnormal   Collection Time: 09/04/20  6:29 PM  Result Value Ref Range   Glucose-Capillary 168 (H) 70 - 99 mg/dL  Glucose, capillary     Status: Abnormal   Collection Time: 09/04/20  8:39 PM  Result Value Ref Range   Glucose-Capillary 141 (H) 70 - 99 mg/dL  CBC     Status: Abnormal   Collection Time: 09/05/20  4:48 AM  Result Value Ref Range   WBC 11.3 (H) 4.0 - 10.5 K/uL   RBC 3.11 (L) 3.87 - 5.11 MIL/uL   Hemoglobin 9.2 (L) 12.0 - 15.0 g/dL   HCT 27.8 (L) 36.0 - 46.0 %   MCV 89.4 80.0 - 100.0 fL   MCH 29.6 26.0 - 34.0 pg   MCHC 33.1 30.0 - 36.0 g/dL   RDW 14.6 11.5 - 15.5 %   Platelets 229 150 - 400 K/uL   nRBC 0.0 0.0 - 0.2 %  Basic metabolic panel     Status: Abnormal   Collection Time: 09/05/20  4:48 AM  Result Value Ref Range   Sodium 137 135 - 145 mmol/L   Potassium 3.4 (L) 3.5 - 5.1 mmol/L    Chloride 103 98 - 111 mmol/L   CO2 27 22 - 32 mmol/L   Glucose, Bld 128 (H) 70 - 99 mg/dL   BUN 10 6 - 20 mg/dL   Creatinine, Ser 0.80 0.44 - 1.00 mg/dL   Calcium 8.2 (L) 8.9 - 10.3 mg/dL   GFR, Estimated >60 >60 mL/min   Anion gap 7 5 - 15  Procalcitonin     Status: None   Collection Time: 09/05/20  4:48 AM  Result Value Ref Range   Procalcitonin 0.27 ng/mL  Glucose, capillary     Status: Abnormal   Collection Time: 09/05/20  8:48 AM  Result Value Ref Range   Glucose-Capillary 116 (H) 70 - 99 mg/dL  Glucose, capillary     Status: Abnormal   Collection Time: 09/05/20 11:42 AM  Result Value Ref Range   Glucose-Capillary 154 (H) 70 - 99 mg/dL    MR BRAIN WO CONTRAST  Result Date: 09/03/2020 CLINICAL DATA:  Dizziness. EXAM: MRI HEAD WITHOUT CONTRAST TECHNIQUE: Multiplanar, multiecho pulse sequences of the brain and surrounding structures were obtained without intravenous contrast. COMPARISON:  None. FINDINGS: Intermittently motion limited study.  Within this limitation: Brain: No acute infarction, hemorrhage, hydrocephalus, extra-axial collection or mass lesion. Vascular: Major arterial flow voids are maintained at the skull base. Skull and upper cervical spine: Normal marrow signal. Sinuses/Orbits: Mild ethmoid air cell mucosal thickening. Otherwise, clear sinuses. Unremarkable orbits. Other: No mastoid effusions. IMPRESSION: No evidence of acute intracranial abnormality on this motion limited study. Electronically Signed   By: Margaretha Sheffield MD   On: 09/03/2020 15:27   DG Chest Port 1 View  Result Date: 09/04/2020 CLINICAL DATA:  Fever.  Shortness of breath. EXAM: PORTABLE CHEST 1 VIEW COMPARISON:  Aug 14, 2020. FINDINGS: Enlarged cardiac silhouette. Prominent right paratracheal stripe likely related to vascular structure accentuated by patient rotation. Pulmonary vascular congestion no consolidation. No visible pleural effusions or pneumothorax on this single AP radiograph.  IMPRESSION: Cardiomegaly and pulmonary vascular congestion without overt pulmonary edema. Electronically Signed   By: Margaretha Sheffield MD   On: 09/04/2020 08:14    Assessment/Plan: 3 Days Post-Op   Principal Problem:   S/P TKR (total knee replacement) using cement, right Active Problems:   Hypothyroid   Hypertension   Asthma   Acute renal failure superimposed on stage 2 chronic kidney disease (HCC)   Vertigo   Normocytic anemia   Type II diabetes mellitus with renal manifestations ([place])  Continue with physical therapy.  We will need to determine if the patient can go home versus go to a SNF.  Patient states her brother has arranged his house to allow her to stay on the first floor.  Patient's dizziness has improved.  Patient had vascular congestion on her chest x-ray and was prescribed a dose of Lasix which may be contributing to her urinary incontinence noted by her therapist.  We will continue to monitor.  An MRI of the brain demonstrated no evidence of intracranial abnormality.  Continue Singulair for wheezing.  Continue Lovenox for DVT prophylaxis.    Thornton Park , MD 09/05/2020, 1:09 PM

## 2020-09-05 NOTE — Progress Notes (Signed)
Physical Therapy Treatment Patient Details Name: Katherine Estes MRN: [id number] DOB: 08/02/1960 Today's Date: 09/05/2020    History of Present Illness Pt is s/p R TKA on 09/02/20.    PT Comments    Pt received in chair upon arrival to room and agreeable to therapy.  Pt was able to transfer to standing and then requested to use the restroom.  Pt was given verbal cues and minA for navigation within the bathroom and for hand placement.  Pt then transferred back to bed where she completed exercises and was given pamphlet for HEP.  Pt agreed to continue to perform throughout the day and tomorrow before therapy.  Discussed d/c options and while PT recommendation is still SNF due to lack of progress, nausea, and pain levels, pt does state that her brother has arranged to where she can live on the first level without complications.   Current discharge plans to SNF remain appropriate at this time.  Pt will continue to benefit from skilled therapy in order to address deficits listed below.   Follow Up Recommendations  SNF     Equipment Recommendations  Rolling walker with 5" wheels;3in1 (PT)    Recommendations for Other Services       Precautions / Restrictions Precautions Precautions: Knee;Fall Required Braces or Orthoses: Knee Immobilizer - Right Restrictions Weight Bearing Restrictions: Yes RLE Weight Bearing: Weight bearing as tolerated    Mobility  Bed Mobility Overal bed mobility: Needs Assistance Bed Mobility: Sit to Supine       Sit to supine: Mod assist   General bed mobility comments: ModA required for R LE navigation into the bed.    Transfers Overall transfer level: Needs assistance Equipment used: Rolling walker (2 wheeled) Transfers: Sit to/from Stand   Stand pivot transfers: Min guard          Ambulation/Gait Ambulation/Gait assistance: Modified independent (Device/Increase time) Gait Distance (Feet): 20 Feet Assistive device: Rolling walker (2  wheeled) Gait Pattern/deviations: Step-through pattern;Decreased step length - left;Decreased stance time - right;Decreased stride length;Step-to pattern Gait velocity: decreased   General Gait Details: Pt able to ambulate and navigate in room to bathroom an back to bed.   Stairs Stairs: Yes Stairs assistance: Mod assist;+2 physical assistance;+2 safety/equipment Stair Management: One rail Right;Step to pattern;Sideways Number of Stairs: 1 General stair comments: Pt with difficulty performing, and leaving body weight on the R LE when lifting the L LE to go up to the next step.  Pt unsafe to perform at this time.   Wheelchair Mobility    Modified Rankin (Stroke Patients Only)       Balance Overall balance assessment: Modified Independent                                          Cognition Arousal/Alertness: Awake/alert Behavior During Therapy: WFL for tasks assessed/performed Overall Cognitive Status: Within Functional Limits for tasks assessed                                        Exercises Total Joint Exercises Ankle Circles/Pumps: AROM;Strengthening;Both;10 reps;Supine Quad Sets: AROM;Strengthening;Both;10 reps;Supine Gluteal Sets: AROM;Strengthening;Both;10 reps;Supine Heel Slides: AROM;Strengthening;Both;10 reps;Supine Hip ABduction/ADduction: AROM;Strengthening;Both;10 reps;Supine Straight Leg Raises: AROM;Strengthening;Both;10 reps;Supine Other Exercises Other Exercises: Pt c decreased recall of previous OT education; additional education provided to support  recall and carryover; pt educated in AE/DME, falls prevention, polar care mgt; and positioning of RLE to support maximal resting knee extension    General Comments General comments (skin integrity, edema, etc.): Pt endorsing decreased sensation "pins and needles" feeling in 3rd-5th digits of R foot and difficulty with ankle movement + pain with WBing; MD notified       Pertinent Vitals/Pain Pain Assessment: 0-10 Pain Score: 7  Pain Location: Medial R Knee Pain Descriptors / Indicators: Throbbing;Grimacing;Guarding;Sharp Pain Intervention(s): Limited activity within patient's tolerance;Monitored during session;Repositioned;Ice applied    Home Living                      Prior Function            PT Goals (current goals can now be found in the care plan section) Acute Rehab PT Goals Patient Stated Goal: To go home PT Goal Formulation: With patient Time For Goal Achievement: 09/16/20 Potential to Achieve Goals: Good Progress towards PT goals: Not progressing toward goals - comment    Frequency    BID      PT Plan Discharge plan needs to be updated    Co-evaluation              AM-PAC PT "6 Clicks" Mobility   Outcome Measure  Help needed turning from your back to your side while in a flat bed without using bedrails?: None Help needed moving from lying on your back to sitting on the side of a flat bed without using bedrails?: None Help needed moving to and from a bed to a chair (including a wheelchair)?: A Little Help needed standing up from a chair using your arms (e.g., wheelchair or bedside chair)?: A Little Help needed to walk in hospital room?: A Lot Help needed climbing 3-5 steps with a railing? : A Lot 6 Click Score: 18    End of Session Equipment Utilized During Treatment: Gait belt Activity Tolerance: Patient tolerated treatment well Patient left: with call bell/phone within reach;in bed;with bed alarm set Nurse Communication: Mobility status PT Visit Diagnosis: Unsteadiness on feet (R26.81);Other abnormalities of gait and mobility (R26.89);Muscle weakness (generalized) (M62.81);Difficulty in walking, not elsewhere classified (R26.2);Pain Pain - Right/Left: Right Pain - part of body: Knee     Time: 6213-0865 PT Time Calculation (min) (ACUTE ONLY): 26 min  Charges:  $Gait Training: 38-52  mins $Therapeutic Exercise: 8-22 mins $Self Care/Home Management: 8-22                     Gwenlyn Saran, PT, DPT 09/05/20, 4:18 PM    [name] 09/05/2020, 4:15 PM

## 2020-09-05 NOTE — Progress Notes (Signed)
PROGRESS NOTE    Katherine Estes  HWE:[id number] DOB: 1960/06/25 DOA: 09/02/2020 PCP: [facility] [employer]    No chief complaint on file.   Brief Narrative:   Katherine Estes is an 60 y.o. female hypertension, diabetes mellitus, asthma, anemia, kidney stone, CKD stage II, hypothyroidism, who was admitted by Dr. Mack Guise of Ortho for right knee replacement on 6/14. Pt developed vertigo after surgery.  So [facility] hospitalist consulted to evaluate, MRI with no acute findings, her symptoms has resolved.  Assessment & Plan:   Principal Problem:   S/P TKR (total knee replacement) using cement, right Active Problems:   Hypothyroid   Hypertension   Asthma   Acute renal failure superimposed on stage 2 chronic kidney disease (HCC)   Vertigo   Normocytic anemia   Type II diabetes mellitus with renal manifestations (HCC)     S/P TKR (total knee replacement) using cement, right: -pain management per primary Ortho team -Patient is on Lovenox for DVT prophylaxis   Vertigo:  -This appears to be has resolved, improved with IV fluids, holding antihypertensive medications, MRI brain with no acute findings.   -She denies any recurrence of her symptoms today.   -She is not orthostatic  Fever  -She is nontoxic-appearing, no evidence of infection, UA is negative, chest x-ray with no pneumonia. -Procalcitonin is reassuring, within normal limit and trending down, no indication for antibiotics, no evidence for infection, this is more likely postoperative fever.  Dyspnea -Chronic dyspnea on exertion, this is at her baseline, this is most likely due to deconditioning, she denies any orthopnea, she did receive 1 dose of 20 mg Lasix 6/16 given evidence of some vascular congestion on chest x-ray. -Currently at baseline, she denies any orthopnea, reports exertional dyspnea at baseline, there is no signs of volume overload, but she is +1.3 L since admission, I will continue to monitor  closely without any further diuresis today given soft blood pressure, no hypoxia, and no dyspnea or any other evidence of volume overload.  Hypertension:  -Blood pressure is soft, so all her antihypertensive medication has been discontinued including hydrochlorothiazide, Cozaar and amlodipine  -I have discussed with the patient, likely she will not need any antihypertensive medications upon discharge, but they can be resumed gradually if her blood pressure started to increase .   Asthma: Stable.  Current oxygen saturation 98% on room air -Bronchodilators, Singulair -As needed Mucinex   Hypothyroid -Synthroid   Acute renal failure superimposed on stage 2 chronic kidney disease ([place]):  -Recent baseline creatinine 0.77 on 08/28/2020.  Her creatinine is 1.20, BUN 18. -Hold off HCTZ and Cozaar   Normocytic anemia: Hemoglobin 11.2 on 09/02/2020.   -Follow-up with CBC   Type II diabetes mellitus with renal manifestations: Recent A1c 6.2, well controlled.  Patient taking metformin -Sliding scale insulin    Hypokalemia -Repleted       DVT prophylaxis: Lovenox Code Status: Full Family Communication: none at bedside Disposition: Per primary orthopedic team, patient will need SNF placement per PT evaluation.     Consultants:  [facility] for medical management   Subjective:  Patient with fever 100.5 overnight, she denies any cough or dysuria.  No dyspnea at rest, but dyspnea with activity, no orthopnea.  Objective: Vitals:   09/05/20 0753 09/05/20 1142 09/05/20 1143 09/05/20 1557  BP: 124/65 121/72 121/72 122/65  Pulse: 97 91 91 93  Resp: 17 17 17 16   Temp: 98.7 F (37.1 C) 98.3 F (36.8 C) 98.3 F (36.8  C) 99.8 F (37.7 C)  TempSrc:      SpO2: 96%  96% 100%  Weight:      Height:        Intake/Output Summary (Last 24 hours) at 09/05/2020 1621 Last data filed at 09/05/2020 0348 Gross per 24 hour  Intake 0 ml  Output 1200 ml  Net -1200 ml   Filed Weights    09/02/20 0703  Weight: 116.4 kg    Examination:  Awake Alert, Oriented X 3, No new F.N deficits, Normal affect Symmetrical Chest wall movement, Good air movement bilaterally, CTAB RRR,No Gallops,Rubs or new Murmurs, No Parasternal Heave +ve B.Sounds, Abd Soft, No tenderness, No rebound - guarding or rigidity. Right lower extremity nebulizer, intact pulses distally bilaterally.   Data Reviewed: I have personally reviewed following labs and imaging studies  CBC: Recent Labs  Lab 09/02/20 0707 09/03/20 0419 09/04/20 0401 09/05/20 0448  WBC  --  9.6 12.0* 11.3*  HGB 11.2* 9.8* 10.1* 9.2*  HCT 33.0* 30.6* 31.2* 27.8*  MCV  --  91.1 91.0 89.4  PLT  --  195 208 366    Basic Metabolic Panel: Recent Labs  Lab 09/02/20 0707 09/03/20 0419 09/04/20 0401 09/05/20 0448  NA 138 134* 134* 137  K 3.0* 4.0 3.7 3.4*  CL 102 105 102 103  CO2  --  24 24 27   GLUCOSE 125* 131* 121* 128*  BUN 15 18 14 10   CREATININE 1.20* 1.20* 0.90 0.80  CALCIUM  --  7.7* 8.3* 8.2*    GFR: Estimated Creatinine Clearance: 98.1 mL/min (by C-G formula based on SCr of 0.8 mg/dL).  Liver Function Tests: No results for input(s): AST, ALT, ALKPHOS, BILITOT, PROT, ALBUMIN in the last 168 hours.  CBG: Recent Labs  Lab 09/04/20 1829 09/04/20 2039 09/05/20 0848 09/05/20 1142 09/05/20 1559  GLUCAP 168* 141* 116* 154* 97     Recent Results (from the past 240 hour(s))  Surgical pcr screen     Status: None   Collection Time: 08/28/20 10:31 AM   Specimen: Nasal Mucosa; Nasal Swab  Result Value Ref Range Status   MRSA, PCR NEGATIVE NEGATIVE Final   Staphylococcus aureus NEGATIVE NEGATIVE Final    Comment: (NOTE) The Xpert SA Assay (FDA approved for NASAL specimens in patients 67 years of age and older), is one component of a comprehensive surveillance program. It is not intended to diagnose infection nor to guide or monitor treatment. Performed at [facility], [address],  [place], [place] [address]   Urine Culture     Status: Abnormal   Collection Time: 08/28/20 10:31 AM   Specimen: Urine, Random  Result Value Ref Range Status   Specimen Description   Final    URINE, RANDOM Performed at [facility], [address] [place], [place] [address]    Special Requests   Final    NONE Performed at [facility], [address] [place], [place] [address]    Culture (A)  Final    >=100,000 COLONIES/mL STREPTOCOCCUS AGALACTIAE TESTING AGAINST S. AGALACTIAE NOT ROUTINELY PERFORMED DUE TO PREDICTABILITY OF AMP/PEN/VAN SUSCEPTIBILITY. Performed at [facility], [place] [address] [place], [place] [address]    Report Status 08/30/2020 FINAL  Final  SARS CORONAVIRUS 2 (TAT 6-24 HRS) Nasopharyngeal Nasopharyngeal Swab     Status: None   Collection Time: 08/29/20 11:29 AM   Specimen: Nasopharyngeal Swab  Result Value Ref Range Status   SARS Coronavirus 2 NEGATIVE NEGATIVE Final  Comment: (NOTE) SARS-CoV-2 target nucleic acids are NOT DETECTED.  The SARS-CoV-2 RNA is generally detectable in upper and lower respiratory specimens during the acute phase of infection. Negative results do not preclude SARS-CoV-2 infection, do not rule out co-infections with other pathogens, and should not be used as the sole basis for treatment or other patient management decisions. Negative results must be combined with clinical observations, patient history, and epidemiological information. The expected result is Negative.  Fact Sheet for Patients: SugarRoll.be  Fact Sheet for Healthcare Providers: https://www.woods-mathews.com/  This test is not yet approved or cleared by the [place] FDA and  has been authorized for detection and/or diagnosis of SARS-CoV-2 by FDA under an Emergency Use Authorization (EUA). This EUA will remain  in effect (meaning this test can be used) for the duration of the COVID-19  declaration under Se ction 564(b)(1) of the Act, 21 U.S.C. section 360bbb-3(b)(1), unless the authorization is terminated or revoked sooner.  Performed at [facility], [place] [address] [place], [place] [address]          Radiology Studies: DG Chest Port 1 View  Result Date: 09/04/2020 CLINICAL DATA:  Fever.  Shortness of breath. EXAM: PORTABLE CHEST 1 VIEW COMPARISON:  Aug 14, 2020. FINDINGS: Enlarged cardiac silhouette. Prominent right paratracheal stripe likely related to vascular structure accentuated by patient rotation. Pulmonary vascular congestion no consolidation. No visible pleural effusions or pneumothorax on this single AP radiograph. IMPRESSION: Cardiomegaly and pulmonary vascular congestion without overt pulmonary edema. Electronically Signed   By: Margaretha Sheffield MD   On: 09/04/2020 08:14        Scheduled Meds:  cholecalciferol  1,000 Units Oral Daily   docusate sodium  100 mg Oral BID   enoxaparin (LOVENOX) injection  40 mg Subcutaneous Q24H   insulin aspart  0-5 Units Subcutaneous QHS   insulin aspart  0-9 Units Subcutaneous TID WC   levothyroxine  150 mcg Oral QAC breakfast   mometasone-formoterol  2 puff Inhalation BID   montelukast  10 mg Oral QHS   potassium chloride  10 mEq Oral Daily   traMADol  50 mg Oral Q6H   Continuous Infusions:  methocarbamol (ROBAXIN) IV     promethazine (PHENERGAN) injection (IM or IVPB)       LOS: 2 days       Phillips Climes, MD [facility]   To contact the attending provider between 7A-7P or the covering provider during after hours 7P-7A, please log into the web site [email or web address] and access using universal [place] password for that web site. If you do not have the password, please call the hospital operator.  09/05/2020, 4:21 PM

## 2020-09-05 NOTE — TOC Progression Note (Signed)
Transition of Care [facility]) - Progression Note    Patient Details  Name: Solenne Manwarren MRN: [id number] Date of Birth: 1960-06-08  Transition of Care [facility]) CM/SW [place], RN Phone Number: 09/05/2020, 3:09 PM  Clinical Narrative:     Met with the patient in the room, we discuss DC plan, she stated that she was thinking about going to rehab only because she has 13 steps to go upstairs in her home, her brother fixed the house so that she can remain on the main floor, her bed is set up on the main floor and she has a bathroom on the main floor, she stated that her brother will be helping her, she understnads that she does not have Home health set up and plans to go to Out patient PT at [facility], they will set up the appointment, she stated that she has transportation, she needs a RW and a 3 in 1 and that will be brought into the room by [employer]. 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 She stated that she does not have additional needs       Expected Discharge Plan and Services                                                 Social Determinants of Health (SDOH) Interventions    Readmission Risk Interventions No flowsheet data found.

## 2020-09-05 NOTE — Progress Notes (Signed)
Physical Therapy Treatment Patient Details Name: Katherine Estes MRN: [id number] DOB: 24-Feb-1961 Today's Date: 09/05/2020    History of Present Illness      PT Comments    Pt received seated in recliner upon arrival to room.  Pt agreeable to therapy.  Pt was able to transfer to standing with increased effort and time.  Pt was able to ambulate further today and made it to the therapy gym where stair training was attempted.  Pt was able to get to the first step, but no further and had significant difficulty with performing the one step.  +2 assistance was required for safety in performing task.  Pt was fatigued following attempt and was transferred back to recliner and back to room.  All needs met following treatment session.  Pt educated on current recommendations of SNF and reasoning behind recommendation.  Pt is unable to get up the 13 steps that she has at her house to get to her bedroom and has limited caregiver support at home.  Pt also demonstrated decreased sensation of her R 3-5 digits in her foot.  Pt is also currently having urinary incontinence issues as well that are new to this episode of care.  PT contacted nursing and surgeon about current symptoms via secure chat.    Current discharge plans to SNF remain appropriate at this time.  Pt will continue to benefit from skilled therapy in order to address deficits listed below.    Follow Up Recommendations  SNF     Equipment Recommendations  Rolling walker with 5" wheels;3in1 (PT)    Recommendations for Other Services       Precautions / Restrictions Precautions Precautions: Knee;Fall Required Braces or Orthoses: Knee Immobilizer - Right Restrictions Weight Bearing Restrictions: Yes RLE Weight Bearing: Weight bearing as tolerated    Mobility  Bed Mobility               General bed mobility comments: Pt received in recliner and left upright in recliner following treatment.    Transfers Overall transfer level:  Needs assistance Equipment used: Rolling walker (2 wheeled) Transfers: Sit to/from Stand   Stand pivot transfers: Min guard          Ambulation/Gait Ambulation/Gait assistance: Modified independent (Device/Increase time) Gait Distance (Feet): 120 Feet Assistive device: Rolling walker (2 wheeled) Gait Pattern/deviations: Step-through pattern;Decreased step length - left;Decreased stance time - right;Decreased stride length;Step-to pattern Gait velocity: decreased   General Gait Details: Pt able to ambulate to therapy gym with one standing rest break.   Stairs Stairs: Yes Stairs assistance: Mod assist;+2 physical assistance;+2 safety/equipment Stair Management: One rail Right;Step to pattern;Sideways Number of Stairs: 1 General stair comments: Pt with difficulty performing, and leaving body weight on the R LE when lifting the L LE to go up to the next step.  Pt unsafe to perform at this time.   Wheelchair Mobility    Modified Rankin (Stroke Patients Only)       Balance Overall balance assessment: Modified Independent                                          Cognition Arousal/Alertness: Awake/alert Behavior During Therapy: WFL for tasks assessed/performed Overall Cognitive Status: Within Functional Limits for tasks assessed  Exercises      General Comments        Pertinent Vitals/Pain Pain Assessment: 0-10 Pain Score: 6  Pain Location: Medial R Knee Pain Descriptors / Indicators: Throbbing;Grimacing;Guarding;Sharp Pain Intervention(s): Limited activity within patient's tolerance;Monitored during session;Premedicated before session;Repositioned;Ice applied    Home Living                      Prior Function            PT Goals (current goals can now be found in the care plan section) Acute Rehab PT Goals Patient Stated Goal: To go home PT Goal Formulation: With  patient Time For Goal Achievement: 09/16/20 Potential to Achieve Goals: Good Progress towards PT goals: Not progressing toward goals - comment    Frequency    BID      PT Plan Discharge plan needs to be updated    Co-evaluation              AM-PAC PT "6 Clicks" Mobility   Outcome Measure  Help needed turning from your back to your side while in a flat bed without using bedrails?: None Help needed moving from lying on your back to sitting on the side of a flat bed without using bedrails?: None Help needed moving to and from a bed to a chair (including a wheelchair)?: A Little Help needed standing up from a chair using your arms (e.g., wheelchair or bedside chair)?: A Little Help needed to walk in hospital room?: A Lot Help needed climbing 3-5 steps with a railing? : A Lot 6 Click Score: 18    End of Session Equipment Utilized During Treatment: Gait belt Activity Tolerance: Patient tolerated treatment well Patient left: with call bell/phone within reach;in bed;with bed alarm set Nurse Communication: Mobility status PT Visit Diagnosis: Unsteadiness on feet (R26.81);Other abnormalities of gait and mobility (R26.89);Muscle weakness (generalized) (M62.81);Difficulty in walking, not elsewhere classified (R26.2);Pain Pain - Right/Left: Right Pain - part of body: Knee     Time: 0920-1005 PT Time Calculation (min) (ACUTE ONLY): 45 min  Charges:  $Gait Training: 38-52 mins                     Gwenlyn Saran, PT, DPT 09/05/20, 1:08 PM    Katherine Estes 09/05/2020, 12:51 PM

## 2020-09-05 NOTE — Progress Notes (Signed)
Occupational Therapy Treatment Patient Details Name: Katherine Estes MRN: [id number] DOB: [birth year] Today's Date: 09/05/2020    History of present illness Pt is s/p R TKA on 09/02/20.   OT comments  Pt seen for OT tx this date. Pt received in recliner, agreeable to OT. Pt demo'd decreased recall of previous OT education. Additional education provided to support recall and carryover. Pt educated in AE/DME, falls prevention, polar care mgt; and positioning of RLE to support maximal resting knee extension. Pt verbalized understanding and reports her brother has prepared space on the main floor of the home for her to use upon discharge, however, pt does endorse she is not progressing as well as expected. Pt also endorsing impaired sensation (pins and needles) in 3rd-5th digits on R toe, new since surgery. RN, MD notified. Pt not progressing as expected towards goals. Discharge recommendations updated to reflect slow progress.    Follow Up Recommendations  SNF    Equipment Recommendations  3 in 1 bedside commode;Other (comment) (reacher)    Recommendations for Other Services      Precautions / Restrictions Precautions Precautions: Knee;Fall Required Braces or Orthoses: Knee Immobilizer - Right Restrictions Weight Bearing Restrictions: Yes RLE Weight Bearing: Weight bearing as tolerated       Mobility Bed Mobility               General bed mobility comments: NT, up in recliner at start and finish    Transfers Overall transfer level: Needs assistance Equipment used: Rolling walker (2 wheeled) Transfers: Sit to/from Stand   Stand pivot transfers: Min guard            Balance Overall balance assessment: Modified Independent                                         ADL either performed or assessed with clinical judgement   ADL Overall ADL's : Needs assistance/impaired                     Lower Body Dressing: Moderate assistance;Maximal  assistance Lower Body Dressing Details (indicate cue type and reason): Pt educated in AE for LB dressing to support improved independence                     Vision       Perception     Praxis      Cognition Arousal/Alertness: Awake/alert Behavior During Therapy: WFL for tasks assessed/performed Overall Cognitive Status: Within Functional Limits for tasks assessed                                          Exercises Other Exercises Other Exercises: Pt c decreased recall of previous OT education; additional education provided to support recall and carryover; pt educated in AE/DME, falls prevention, polar care mgt; and positioning of RLE to support maximal resting knee extension   Shoulder Instructions       General Comments Pt endorsing decreased sensation "pins and needles" feeling in 3rd-5th digits of R foot and difficulty with ankle movement + pain with WBing; MD notified    Pertinent Vitals/ Pain       Pain Assessment: 0-10 Pain Score: 7  Pain Location: Medial R Knee Pain Descriptors / Indicators: Throbbing;Grimacing;Guarding;Sharp Pain Intervention(s): Limited  activity within patient's tolerance;Monitored during session;Repositioned;Patient requesting pain meds-RN notified;Ice applied  Home Living                                          Prior Functioning/Environment              Frequency  Min 1X/week        Progress Toward Goals  OT Goals(current goals can now be found in the care plan section)  Progress towards OT goals: Not progressing toward goals - comment  Acute Rehab OT Goals Patient Stated Goal: To go home OT Goal Formulation: With patient Time For Goal Achievement: 09/17/20  Plan Frequency remains appropriate;Discharge plan needs to be updated    Co-evaluation                 AM-PAC OT "6 Clicks" Daily Activity     Outcome Measure   Help from another person eating meals?: None Help from  another person taking care of personal grooming?: None Help from another person toileting, which includes using toliet, bedpan, or urinal?: A Little Help from another person bathing (including washing, rinsing, drying)?: A Little Help from another person to put on and taking off regular upper body clothing?: None Help from another person to put on and taking off regular lower body clothing?: A Lot 6 Click Score: 20    End of Session    OT Visit Diagnosis: Unsteadiness on feet (R26.81);Muscle weakness (generalized) (M62.81);Other abnormalities of gait and mobility (R26.89)   Activity Tolerance Patient tolerated treatment well   Patient Left in chair;with call bell/phone within reach;with chair alarm set;Other (comment) (polar care in place, rolled towel under R ankle)   Nurse Communication Patient requests pain meds        Time: 5749-3552 OT Time Calculation (min): 27 min  Charges: OT General Charges $OT Visit: 1 Visit OT Treatments $Self Care/Home Management : 23-37 mins  Hanley Hays, MPH, MS, OTR/L ascom [phone] 09/05/20, 1:38 PM

## 2020-09-06 DIAGNOSIS — I10 Essential (primary) hypertension: Secondary | ICD-10-CM | POA: Diagnosis not present

## 2020-09-06 DIAGNOSIS — Z96651 Presence of right artificial knee joint: Secondary | ICD-10-CM | POA: Diagnosis not present

## 2020-09-06 LAB — CBC
HCT: 28.5 % — ABNORMAL LOW (ref 36.0–46.0)
Hemoglobin: 9.2 g/dL — ABNORMAL LOW (ref 12.0–15.0)
MCH: 29.1 pg (ref 26.0–34.0)
MCHC: 32.3 g/dL (ref 30.0–36.0)
MCV: 90.2 fL (ref 80.0–100.0)
Platelets: 262 10*3/uL (ref 150–400)
RBC: 3.16 MIL/uL — ABNORMAL LOW (ref 3.87–5.11)
RDW: 14.7 % (ref 11.5–15.5)
WBC: 11.5 10*3/uL — ABNORMAL HIGH (ref 4.0–10.5)
nRBC: 0 % (ref 0.0–0.2)

## 2020-09-06 LAB — BASIC METABOLIC PANEL
Anion gap: 6 (ref 5–15)
BUN: 11 mg/dL (ref 6–20)
CO2: 29 mmol/L (ref 22–32)
Calcium: 8.3 mg/dL — ABNORMAL LOW (ref 8.9–10.3)
Chloride: 101 mmol/L (ref 98–111)
Creatinine, Ser: 0.81 mg/dL (ref 0.44–1.00)
GFR, Estimated: >60 mL/min (ref >60)
Glucose, Bld: 122 mg/dL — ABNORMAL HIGH (ref 70–99)
Potassium: 4 mmol/L (ref 3.5–5.1)
Sodium: 136 mmol/L (ref 135–145)

## 2020-09-06 LAB — GLUCOSE, CAPILLARY
Glucose-Capillary: 106 mg/dL — ABNORMAL HIGH (ref 70–99)
Glucose-Capillary: 173 mg/dL — ABNORMAL HIGH (ref 70–99)
Glucose-Capillary: 85 mg/dL (ref 70–99)
Glucose-Capillary: 121 mg/dL — ABNORMAL HIGH (ref 70–99)

## 2020-09-06 LAB — PROCALCITONIN: Procalcitonin: 0.22 ng/mL

## 2020-09-06 MED ORDER — PANTOPRAZOLE SODIUM 40 MG PO TBEC
40.0000 mg | DELAYED_RELEASE_TABLET | Freq: Every day | ORAL | Status: DC
  Administered 2020-09-06 – 2020-09-08 (×3): 40 mg via ORAL
  Filled 2020-09-06 (×2): qty 1

## 2020-09-06 MED ORDER — HYDROCHLOROTHIAZIDE 25 MG PO TABS
25.0000 mg | ORAL_TABLET | Freq: Every day | ORAL | Status: DC
  Administered 2020-09-06 – 2020-09-08 (×3): 25 mg via ORAL
  Filled 2020-09-06 (×3): qty 1

## 2020-09-06 MED ORDER — PANTOPRAZOLE SODIUM 40 MG PO TBEC: 40.0000 mg | DELAYED_RELEASE_TABLET | Freq: Every day | ORAL | 0 refills | Status: DC

## 2020-09-06 NOTE — Progress Notes (Signed)
Physical Therapy Treatment Patient Details Name: Katherine Estes MRN: [id number] DOB: 07-12-1960 Today's Date: 09/06/2020    History of Present Illness Pt is s/p R TKA on 09/02/20.    PT Comments     Patient responded to second session of PT today focusing on reviewing/performing ROM activities and importance in rehab. Measured Knee today and patient is pain limited with AROM- improved sit to stand this visit with decreased VC and physical assist and improving heel to toe sequencing with no loss of balance. The patient will benefit from further skilled PT intervention to continue to progress toward goals. Recommending HHPT to continue with ROM, Strengthening, and mobility for continued progress in her rehabilitation.   Follow Up Recommendations  Home health PT     Equipment Recommendations  Rolling walker with 5" wheels;3in1 (PT)    Recommendations for Other Services       Precautions / Restrictions Precautions Precautions: Knee;Fall Required Braces or Orthoses: Knee Immobilizer - Right Restrictions Weight Bearing Restrictions: Yes RLE Weight Bearing: Weight bearing as tolerated    Mobility  Bed Mobility Overal bed mobility: Needs Assistance Bed Mobility: Sit to Supine       Sit to supine: Min assist;Mod assist   General bed mobility comments: ModA required for R LE navigation into the bed. Patient Response: Cooperative  Transfers Overall transfer level: Needs assistance Equipment used: Rolling walker (2 wheeled) Transfers: Sit to/from Stand Sit to Stand: Min guard         General transfer comment: VC to position Right LE out in front to avoid pain with bending but be mindful of flexing as much as possible to assist with transfer with as minimal pain as possible. Paitent verbalized understanding.  Ambulation/Gait Ambulation/Gait assistance: Supervision Gait Distance (Feet): 70 Feet Assistive device: Rolling walker (2 wheeled) Gait Pattern/deviations:  Step-through pattern;Decreased step length - left;Decreased stance time - right;Decreased stride length;Step-to pattern Gait velocity: <1.0 m/s   General Gait Details: Decreased heel to toe with heavy UE support- VC for gait sequencing with some improvement- improved heel strike upon cueing.   Stairs             Wheelchair Mobility    Modified Rankin (Stroke Patients Only)       Balance Overall balance assessment: Modified Independent                                          Cognition Arousal/Alertness: Awake/alert Behavior During Therapy: WFL for tasks assessed/performed Overall Cognitive Status: Within Functional Limits for tasks assessed                                        Exercises Total Joint Exercises Ankle Circles/Pumps: AROM;Strengthening;Both;10 reps;Supine Quad Sets: AROM;Strengthening;Both;10 reps;Supine Gluteal Sets: AROM;Strengthening;Both;10 reps;Supine Heel Slides: Strengthening;Both;10 reps;Supine;AAROM Hip ABduction/ADduction: AROM;Strengthening;Both;10 reps;Supine Straight Leg Raises: AAROM;Strengthening;Right;10 reps Knee Flexion: AAROM;Strengthening;Right;10 reps Goniometric ROM: -12 deg to 72 deg AROM (ext measured in recliner and flex measured sitting at EOB)    General Comments        Pertinent Vitals/Pain Pain Assessment: 0-10 Pain Score: 4  Pain Descriptors / Indicators: Aching Pain Intervention(s): Limited activity within patient's tolerance;Monitored during session;Premedicated before session;Repositioned;Ice applied    Home Living  Prior Function            PT Goals (current goals can now be found in the care plan section) Acute Rehab PT Goals Patient Stated Goal: To go home PT Goal Formulation: With patient Time For Goal Achievement: 09/16/20 Potential to Achieve Goals: Good Progress towards PT goals: Progressing toward goals    Frequency    BID       PT Plan Current plan remains appropriate    Co-evaluation              AM-PAC PT "6 Clicks" Mobility   Outcome Measure  Help needed turning from your back to your side while in a flat bed without using bedrails?: None Help needed moving from lying on your back to sitting on the side of a flat bed without using bedrails?: None Help needed moving to and from a bed to a chair (including a wheelchair)?: A Little Help needed standing up from a chair using your arms (e.g., wheelchair or bedside chair)?: A Little Help needed to walk in hospital room?: A Little Help needed climbing 3-5 steps with a railing? : A Lot 6 Click Score: 19    End of Session Equipment Utilized During Treatment: Gait belt Activity Tolerance: Patient tolerated treatment well;No increased pain Patient left: in bed;with call bell/phone within reach;with bed alarm set;with family/visitor present;with SCD's reapplied Nurse Communication: Mobility status PT Visit Diagnosis: Unsteadiness on feet (R26.81);Other abnormalities of gait and mobility (R26.89);Muscle weakness (generalized) (M62.81);Difficulty in walking, not elsewhere classified (R26.2);Pain Pain - Right/Left: Right Pain - part of body: Knee     Time: 1937-9024 PT Time Calculation (min) (ACUTE ONLY): 42 min  Charges:  $Gait Training: 8-22 mins $Therapeutic Exercise: 23-37 mins                       Lewis Moccasin, PT 09/06/2020, 5:20 PM

## 2020-09-06 NOTE — Plan of Care (Signed)
  Problem: Health Behavior/Discharge Planning: Goal: Ability to manage health-related needs will improve Outcome: Progressing   

## 2020-09-06 NOTE — Discharge Summary (Signed)
PROGRESS NOTE    Katherine Estes  VVO:[id number] DOB: 09/24/1960 DOA: 09/02/2020 PCP: [facility] [employer]    No chief complaint on file.   Brief Narrative:   Katherine Estes is an 60 y.o. female hypertension, diabetes mellitus, asthma, anemia, kidney stone, CKD stage II, hypothyroidism, who was admitted by Dr. Mack Guise of Ortho for right knee replacement on 6/14. Pt developed vertigo after surgery.  So [facility] hospitalist consulted to evaluate, MRI with no acute findings, her symptoms has resolved.  Assessment & Plan:   Principal Problem:   S/P TKR (total knee replacement) using cement, right Active Problems:   Hypothyroid   Hypertension   Asthma   Acute renal failure superimposed on stage 2 chronic kidney disease (HCC)   Vertigo   Normocytic anemia   Type II diabetes mellitus with renal manifestations (HCC)     S/P TKR (total knee replacement) using cement, right: -pain management per primary Ortho team -Patient is on Lovenox for DVT prophylaxis   Vertigo:  -This appears to be has resolved, improved with IV fluids, holding antihypertensive medications, MRI brain with no acute findings.   -She denies any recurrence of her symptoms  -She is not orthostatic  Fever  -She is nontoxic-appearing, no evidence of infection, UA is negative, chest x-ray with no pneumonia. -Procalcitonin is reassuring, within normal limit and trending down, no indication for antibiotics, no evidence for infection, this is more likely postoperative fever.  Dyspnea -Chronic dyspnea on exertion, this is at her baseline, this is most likely due to deconditioning, she denies any orthopnea, she did receive 1 dose of 20 mg Lasix 6/16 given evidence of some vascular congestion on chest x-ray. -Currently at baseline, she denies any orthopnea, reports exertional dyspnea at baseline, there is no signs of volume overload, but she is +1.3 L since admission, I will continue to monitor closely  without any further diuresis today given soft blood pressure, no hypoxia, and no dyspnea or any other evidence of volume overload.  GERD -Patient does report cough, upon laying supine, findings significant for GERD, she is started on Protonix.  Hypertension:  -Blood pressure is soft, so all her antihypertensive medication has been discontinued including hydrochlorothiazide, Cozaar and amlodipine  -Now blood pressure started to increase we will resume her back on hydrochlorothiazide.  Asthma: Stable.  Current oxygen saturation 98% on room air -Bronchodilators, Singulair -As needed Mucinex   Hypothyroid -Synthroid   Acute renal failure superimposed on stage 2 chronic kidney disease ([place]):  -Recent baseline creatinine 0.77 on 08/28/2020.  Her creatinine is 1.20, BUN 18.   Normocytic anemia: Hemoglobin 11.2 on 09/02/2020.   -Follow-up with CBC   Type II diabetes mellitus with renal manifestations: Recent A1c 6.2, well controlled.  Patient taking metformin -Sliding scale insulin    Hypokalemia -Repleted     DVT prophylaxis: Lovenox Code Status: Full Family Communication: none at bedside Disposition: Per primary orthopedic team.     Consultants:  [facility] for medical management   Subjective:  Patient is afebrile, she does report some cough while laying supine, denies any fever or chills.  Objective: Vitals:   09/05/20 1956 09/05/20 2317 09/06/20 0329 09/06/20 0805  BP: 137/63 116/69 131/73 135/71  Pulse: 97 80 90 88  Resp: 19 16 19 18   Temp: 98.6 F (37 C) 97.9 F (36.6 C) 98.2 F (36.8 C) 99 F (37.2 C)  TempSrc: Oral  Oral   SpO2: 92% 95% 95% 98%  Weight:  Height:        Intake/Output Summary (Last 24 hours) at 09/06/2020 1353 Last data filed at 09/06/2020 1040 Gross per 24 hour  Intake 240 ml  Output 500 ml  Net -260 ml   Filed Weights   09/02/20 0703  Weight: 116.4 kg    Examination:  Awake Alert, Oriented X 3, No new F.N deficits,  Normal affect Symmetrical Chest wall movement, Good air movement bilaterally, CTAB RRR,No Gallops,Rubs or new Murmurs, No Parasternal Heave +ve B.Sounds, Abd Soft, No tenderness, No rebound - guarding or rigidity. No Cyanosis, right lower extremity in immobilizer.   Data Reviewed: I have personally reviewed following labs and imaging studies  CBC: Recent Labs  Lab 09/02/20 0707 09/03/20 0419 09/04/20 0401 09/05/20 0448 09/06/20 0419  WBC  --  9.6 12.0* 11.3* 11.5*  HGB 11.2* 9.8* 10.1* 9.2* 9.2*  HCT 33.0* 30.6* 31.2* 27.8* 28.5*  MCV  --  91.1 91.0 89.4 90.2  PLT  --  195 208 229 272    Basic Metabolic Panel: Recent Labs  Lab 09/02/20 0707 09/03/20 0419 09/04/20 0401 09/05/20 0448 09/06/20 0419  NA 138 134* 134* 137 136  K 3.0* 4.0 3.7 3.4* 4.0  CL 102 105 102 103 101  CO2  --  24 24 27 29   GLUCOSE 125* 131* 121* 128* 122*  BUN 15 18 14 10 11   CREATININE 1.20* 1.20* 0.90 0.80 0.81  CALCIUM  --  7.7* 8.3* 8.2* 8.3*    GFR: Estimated Creatinine Clearance: 96.9 mL/min (by C-G formula based on SCr of 0.81 mg/dL).  Liver Function Tests: No results for input(s): AST, ALT, ALKPHOS, BILITOT, PROT, ALBUMIN in the last 168 hours.  CBG: Recent Labs  Lab 09/05/20 1559 09/05/20 1706 09/05/20 2039 09/06/20 0831 09/06/20 1223  GLUCAP 97 108* 170* 106* 173*     Recent Results (from the past 240 hour(s))  Surgical pcr screen     Status: None   Collection Time: 08/28/20 10:31 AM   Specimen: Nasal Mucosa; Nasal Swab  Result Value Ref Range Status   MRSA, PCR NEGATIVE NEGATIVE Final   Staphylococcus aureus NEGATIVE NEGATIVE Final    Comment: (NOTE) The Xpert SA Assay (FDA approved for NASAL specimens in patients 85 years of age and older), is one component of a comprehensive surveillance program. It is not intended to diagnose infection nor to guide or monitor treatment. Performed at [facility], [address], [place], [place] [address]   Urine  Culture     Status: Abnormal   Collection Time: 08/28/20 10:31 AM   Specimen: Urine, Random  Result Value Ref Range Status   Specimen Description   Final    URINE, RANDOM Performed at [facility], [address] [place], [place] [address]    Special Requests   Final    NONE Performed at [facility], [place] [address] [place], [place] [address]    Culture (A)  Final    >=100,000 COLONIES/mL STREPTOCOCCUS AGALACTIAE TESTING AGAINST S. AGALACTIAE NOT ROUTINELY PERFORMED DUE TO PREDICTABILITY OF AMP/PEN/VAN SUSCEPTIBILITY. Performed at [facility], [place] [address] [place], [place] [address]    Report Status 08/30/2020 FINAL  Final  SARS CORONAVIRUS 2 (TAT 6-24 HRS) Nasopharyngeal Nasopharyngeal Swab     Status: None   Collection Time: 08/29/20 11:29 AM   Specimen: Nasopharyngeal Swab  Result Value Ref Range Status   SARS Coronavirus 2 NEGATIVE NEGATIVE Final    Comment: (NOTE) SARS-CoV-2 target nucleic acids  are NOT DETECTED.  The SARS-CoV-2 RNA is generally detectable in upper and lower respiratory specimens during the acute phase of infection. Negative results do not preclude SARS-CoV-2 infection, do not rule out co-infections with other pathogens, and should not be used as the sole basis for treatment or other patient management decisions. Negative results must be combined with clinical observations, patient history, and epidemiological information. The expected result is Negative.  Fact Sheet for Patients: SugarRoll.be  Fact Sheet for Healthcare Providers: https://www.woods-mathews.com/  This test is not yet approved or cleared by the [place] FDA and  has been authorized for detection and/or diagnosis of SARS-CoV-2 by FDA under an Emergency Use Authorization (EUA). This EUA will remain  in effect (meaning this test can be used) for the duration of the COVID-19 declaration under Se ction 564(b)(1) of  the Act, 21 U.S.C. section 360bbb-3(b)(1), unless the authorization is terminated or revoked sooner.  Performed at [facility], [place] [address], [place], [place] [address]          Radiology Studies: No results found.      Scheduled Meds:  cholecalciferol  1,000 Units Oral Daily   docusate sodium  100 mg Oral BID   enoxaparin (LOVENOX) injection  40 mg Subcutaneous Q24H   hydrochlorothiazide  25 mg Oral Daily   insulin aspart  0-5 Units Subcutaneous QHS   insulin aspart  0-9 Units Subcutaneous TID WC   levothyroxine  150 mcg Oral QAC breakfast   mometasone-formoterol  2 puff Inhalation BID   montelukast  10 mg Oral QHS   potassium chloride  10 mEq Oral Daily   traMADol  50 mg Oral Q6H   Continuous Infusions:  methocarbamol (ROBAXIN) IV     promethazine (PHENERGAN) injection (IM or IVPB)       LOS: 3 days       Phillips Climes, MD [facility]   To contact the attending provider between 7A-7P or the covering provider during after hours 7P-7A, please log into the web site [email or web address] and access using universal [place] password for that web site. If you do not have the password, please call the hospital operator.  09/06/2020, 1:53 PM

## 2020-09-06 NOTE — Progress Notes (Signed)
Subjective:  POD #4 s/p right TKA.   Patient reports right knee pain as mild to moderate.  She is up out of bed to a chair.  She is seen with her physical therapist in the room.  I just observe the patient walking with physical therapy.  The therapist states that she is making progress but he feels she would benefit from more therapy before being discharged home.  Patient states the paresthesias she had in the third fourth and fifth toes on the right foot has improved today.  Objective:   VITALS:   Vitals:   09/05/20 1956 09/05/20 2317 09/06/20 0329 09/06/20 0805  BP: 137/63 116/69 131/73 135/71  Pulse: 97 80 90 88  Resp: 19 16 19 18   Temp: 98.6 F (37 C) 97.9 F (36.6 C) 98.2 F (36.8 C) 99 F (37.2 C)  TempSrc: Oral  Oral   SpO2: 92% 95% 95% 98%  Weight:      Height:        PHYSICAL EXAM: Right lower extremity Neurovascular intact Sensation intact distally Intact pulses distally Dorsiflexion/Plantar flexion intact Aquacel dressing: scant drainage No cellulitis present Compartment soft  LABS  Results for orders placed or performed during the hospital encounter of 09/02/20 (from the past 24 hour(s))  Glucose, capillary     Status: None   Collection Time: 09/05/20  3:59 PM  Result Value Ref Range   Glucose-Capillary 97 70 - 99 mg/dL  Glucose, capillary     Status: Abnormal   Collection Time: 09/05/20  5:06 PM  Result Value Ref Range   Glucose-Capillary 108 (H) 70 - 99 mg/dL  Glucose, capillary     Status: Abnormal   Collection Time: 09/05/20  8:39 PM  Result Value Ref Range   Glucose-Capillary 170 (H) 70 - 99 mg/dL  Procalcitonin     Status: None   Collection Time: 09/06/20  4:19 AM  Result Value Ref Range   Procalcitonin 0.22 ng/mL  CBC     Status: Abnormal   Collection Time: 09/06/20  4:19 AM  Result Value Ref Range   WBC 11.5 (H) 4.0 - 10.5 K/uL   RBC 3.16 (L) 3.87 - 5.11 MIL/uL   Hemoglobin 9.2 (L) 12.0 - 15.0 g/dL   HCT 28.5 (L) 36.0 - 46.0 %   MCV  90.2 80.0 - 100.0 fL   MCH 29.1 26.0 - 34.0 pg   MCHC 32.3 30.0 - 36.0 g/dL   RDW 14.7 11.5 - 15.5 %   Platelets 262 150 - 400 K/uL   nRBC 0.0 0.0 - 0.2 %  Basic metabolic panel     Status: Abnormal   Collection Time: 09/06/20  4:19 AM  Result Value Ref Range   Sodium 136 135 - 145 mmol/L   Potassium 4.0 3.5 - 5.1 mmol/L   Chloride 101 98 - 111 mmol/L   CO2 29 22 - 32 mmol/L   Glucose, Bld 122 (H) 70 - 99 mg/dL   BUN 11 6 - 20 mg/dL   Creatinine, Ser 0.81 0.44 - 1.00 mg/dL   Calcium 8.3 (L) 8.9 - 10.3 mg/dL   GFR, Estimated >60 >60 mL/min   Anion gap 6 5 - 15  Glucose, capillary     Status: Abnormal   Collection Time: 09/06/20  8:31 AM  Result Value Ref Range   Glucose-Capillary 106 (H) 70 - 99 mg/dL    No results found.  Assessment/Plan: 4 Days Post-Op   Principal Problem:   S/P TKR (total knee  replacement) using cement, right Active Problems:   Hypothyroid   Hypertension   Asthma   Acute renal failure superimposed on stage 2 chronic kidney disease (HCC)   Vertigo   Normocytic anemia   Type II diabetes mellitus with renal manifestations ([place])   Patient continues to improve.  The plan will be for discharge home once cleared by physical therapy.  Physical therapist felt that she would benefit from at least 1 more day of PT here in the hospital before discharge.  Continue Lovenox for DVT prophylaxis while she is here in the hospital.  Will reevaluate tomorrow for possible discharge home.   [place] , MD 09/06/2020, 12:16 PM

## 2020-09-06 NOTE — Progress Notes (Signed)
Physical Therapy Treatment Patient Details Name: Katherine Estes MRN: [id number] DOB: May 08, 1960 Today's Date: 09/06/2020    History of Present Illness Pt is s/p R TKA on 09/02/20.    PT Comments    Patient was alert and ready - sitting up in recliner upon arrival. Reports she has minimal pain -rated at a 2/10. Patient able to perform 10 reps of TKR exercises with VC/TC today and much improved gait distance - focusing on heel to toe sequencing. She continues to require min physical assist to lift LE including SLR and hip abd. The patient would benefit from further skilled PT intervention to continue to progress toward goals including improved ROM/strength/mobility. Recommending HHPT since patient will have flat level entry and staying on main floor with assistance available per patient report.    Follow Up Recommendations  Home health PT     Equipment Recommendations  Rolling walker with 5" wheels;3in1 (PT)    Recommendations for Other Services       Precautions / Restrictions Precautions Precautions: Knee;Fall Required Braces or Orthoses: Knee Immobilizer - Right Knee Immobilizer - Right: Other (comment) (Per [employer] with Dr. Wynetta Emery- KI to be on when lying or siting in chair (unless in bed using zero foam) and may take off when performing exercises/walking.) Restrictions Weight Bearing Restrictions: Yes RLE Weight Bearing: Weight bearing as tolerated    Mobility  Bed Mobility                    Transfers Overall transfer level: Needs assistance Equipment used: Rolling walker (2 wheeled) Transfers: Sit to/from Stand Sit to Stand: Min assist            Ambulation/Gait Ambulation/Gait assistance: Supervision Gait Distance (Feet): 80 Feet Assistive device: Rolling walker (2 wheeled) Gait Pattern/deviations: Step-through pattern;Decreased step length - left;Decreased stance time - right;Decreased stride length;Step-to pattern Gait velocity: <1.0  m/s Gait velocity interpretation: <1.31 ft/sec, indicative of household ambulator General Gait Details: Decreased heel to toe with heavy UE support- VC for gait sequencing with some improvement.   Stairs             Wheelchair Mobility    Modified Rankin (Stroke Patients Only)       Balance Overall balance assessment: Modified Independent                                          Cognition Arousal/Alertness: Awake/alert Behavior During Therapy: WFL for tasks assessed/performed Overall Cognitive Status: Within Functional Limits for tasks assessed                                        Exercises Total Joint Exercises Ankle Circles/Pumps: AROM;Strengthening;Both;10 reps;Supine Quad Sets: AROM;Strengthening;Both;10 reps;Supine Gluteal Sets: AROM;Strengthening;Both;10 reps;Supine Heel Slides: Strengthening;Both;10 reps;Supine;AAROM Hip ABduction/ADduction: AROM;Strengthening;Both;10 reps;Supine Straight Leg Raises: AAROM;Strengthening;Right;10 reps Knee Flexion: AAROM;Strengthening;Right;10 reps    General Comments General comments (skin integrity, edema, etc.): Patient reports decreased pins and needles sensation in Right foot today.      Pertinent Vitals/Pain Pain Assessment: 0-10 Pain Score: 2  Pain Location: Medial R Knee Pain Descriptors / Indicators: Aching;Guarding;Throbbing Pain Intervention(s): Limited activity within patient's tolerance;Monitored during session;Premedicated before session;Repositioned;Ice applied    Home Living  Prior Function            PT Goals (current goals can now be found in the care plan section) Acute Rehab PT Goals Patient Stated Goal: To go home PT Goal Formulation: With patient Time For Goal Achievement: 09/16/20 Potential to Achieve Goals: Good Progress towards PT goals: Progressing toward goals    Frequency    BID      PT Plan Current plan remains  appropriate    Co-evaluation              AM-PAC PT "6 Clicks" Mobility   Outcome Measure  Help needed turning from your back to your side while in a flat bed without using bedrails?: None Help needed moving from lying on your back to sitting on the side of a flat bed without using bedrails?: None Help needed moving to and from a bed to a chair (including a wheelchair)?: A Little Help needed standing up from a chair using your arms (e.g., wheelchair or bedside chair)?: A Little Help needed to walk in hospital room?: A Lot Help needed climbing 3-5 steps with a railing? : A Lot 6 Click Score: 18    End of Session Equipment Utilized During Treatment: Gait belt Activity Tolerance: Patient tolerated treatment well Patient left: in chair;with call bell/phone within reach;with chair alarm set;with SCD's reapplied Nurse Communication: Mobility status PT Visit Diagnosis: Unsteadiness on feet (R26.81);Other abnormalities of gait and mobility (R26.89);Muscle weakness (generalized) (M62.81);Difficulty in walking, not elsewhere classified (R26.2);Pain Pain - Right/Left: Right Pain - part of body: Knee     Time: 1100-1142 PT Time Calculation (min) (ACUTE ONLY): 42 min  Charges:  $Gait Training: 8-22 mins $Therapeutic Exercise: 23-37 mins                        Lewis Moccasin, PT 09/06/2020, 12:19 PM

## 2020-09-07 DIAGNOSIS — Z96651 Presence of right artificial knee joint: Secondary | ICD-10-CM | POA: Diagnosis not present

## 2020-09-07 DIAGNOSIS — I10 Essential (primary) hypertension: Secondary | ICD-10-CM | POA: Diagnosis not present

## 2020-09-07 LAB — GLUCOSE, CAPILLARY
Glucose-Capillary: 117 mg/dL — ABNORMAL HIGH (ref 70–99)
Glucose-Capillary: 117 mg/dL — ABNORMAL HIGH (ref 70–99)
Glucose-Capillary: 93 mg/dL (ref 70–99)
Glucose-Capillary: 156 mg/dL — ABNORMAL HIGH (ref 70–99)

## 2020-09-07 MED ORDER — LOSARTAN POTASSIUM 50 MG PO TABS
50.0000 mg | ORAL_TABLET | Freq: Every day | ORAL | Status: DC
  Administered 2020-09-07 – 2020-09-08 (×2): 50 mg via ORAL
  Filled 2020-09-07 (×2): qty 1

## 2020-09-07 NOTE — Progress Notes (Signed)
I have discussed with the patient if she is to be discharged today, she would continue with Cozaar and chlorothiazide, and to stop taking amlodipine, and she was started on Protonix, and that prescription has been sent to her pharmacy. Phillips Climes MD

## 2020-09-07 NOTE — Progress Notes (Addendum)
Physical Therapy Treatment Patient Details Name: Katherine Estes MRN: [id number] DOB: 05/20/1960 Today's Date: 09/07/2020    History of Present Illness Pt is s/p R TKA on 09/02/20.    PT Comments    Participated in exercises as described below.  To EOB with mod a x 1.  She is able to transfer to commode  then progress gait 110' with RW and min guard.  She is able to increase distances today and is motivated but needs continued verbal cues for walker position which she does correct.  Remained in recliner and stressed need to increase knee flexion.  Stretching and education provided. Stated she is ready for discharge with no further questions.   Follow Up Recommendations  Home health PT     Equipment Recommendations  Rolling walker with 5" wheels;3in1 (PT)    Recommendations for Other Services       Precautions / Restrictions Precautions Precautions: Knee;Fall Required Braces or Orthoses: Knee Immobilizer - Right Restrictions Weight Bearing Restrictions: Yes RLE Weight Bearing: Weight bearing as tolerated    Mobility  Bed Mobility Overal bed mobility: Needs Assistance Bed Mobility: Supine to Sit     Supine to sit: Min assist          Transfers Overall transfer level: Needs assistance Equipment used: Rolling walker (2 wheeled) Transfers: Sit to/from Stand Sit to Stand: Min guard            Ambulation/Gait Ambulation/Gait assistance: Supervision Gait Distance (Feet): 110 Feet Assistive device: Rolling walker (2 wheeled) Gait Pattern/deviations: Step-through pattern;Decreased step length - left;Decreased stance time - right;Decreased stride length;Step-to pattern     General Gait Details: Cues for walker position.  steps too far into walker box.  Corrected with cues and education.   Stairs         General stair comments: declined stating she has no steps into house and will stay on 1st floor.   Wheelchair Mobility    Modified Rankin (Stroke  Patients Only)       Balance Overall balance assessment: Modified Independent                                          Cognition Arousal/Alertness: Awake/alert Behavior During Therapy: WFL for tasks assessed/performed Overall Cognitive Status: Within Functional Limits for tasks assessed                                        Exercises Total Joint Exercises Ankle Circles/Pumps: AROM;Strengthening;Both;10 reps;Supine Quad Sets: AROM;Strengthening;Both;10 reps;Supine Gluteal Sets: AROM;Strengthening;Both;10 reps;Supine Heel Slides: Strengthening;Both;10 reps;Supine;AAROM Hip ABduction/ADduction: AROM;Strengthening;Both;10 reps;Supine Straight Leg Raises: AAROM;Strengthening;Right;10 reps Knee Flexion: AAROM;Strengthening;Right;10 reps Goniometric ROM: 2-70 - stressed importance of continued knee flexion at home.    General Comments        Pertinent Vitals/Pain Pain Assessment: Faces Faces Pain Scale: Hurts little more Pain Location: Medial R Knee Pain Descriptors / Indicators: Aching Pain Intervention(s): Limited activity within patient's tolerance;Monitored during session;Premedicated before session;Repositioned;Ice applied    Home Living                      Prior Function            PT Goals (current goals can now be found in the care plan section) Progress towards PT goals: Progressing  toward goals    Frequency    BID      PT Plan Current plan remains appropriate    Co-evaluation              AM-PAC PT "6 Clicks" Mobility   Outcome Measure  Help needed turning from your back to your side while in a flat bed without using bedrails?: None Help needed moving from lying on your back to sitting on the side of a flat bed without using bedrails?: None Help needed moving to and from a bed to a chair (including a wheelchair)?: A Little Help needed standing up from a chair using your arms (e.g., wheelchair or  bedside chair)?: A Little Help needed to walk in hospital room?: A Little Help needed climbing 3-5 steps with a railing? : A Lot 6 Click Score: 19    End of Session Equipment Utilized During Treatment: Gait belt Activity Tolerance: Patient tolerated treatment well;No increased pain Patient left: in bed;with call bell/phone within reach;with bed alarm set;with family/visitor present;with SCD's reapplied Nurse Communication: Mobility status PT Visit Diagnosis: Unsteadiness on feet (R26.81);Other abnormalities of gait and mobility (R26.89);Muscle weakness (generalized) (M62.81);Difficulty in walking, not elsewhere classified (R26.2);Pain Pain - Right/Left: Right Pain - part of body: Knee     Time: 0413-6438 PT Time Calculation (min) (ACUTE ONLY): 40 min  Charges:  $Gait Training: 23-37 mins $Therapeutic Exercise: 8-22 mins                    Chesley Noon, PTA 09/07/20, 1:25 PM , 1:20 PM

## 2020-09-07 NOTE — Progress Notes (Signed)
PROGRESS NOTE    Katherine Estes  XLK:[id number] DOB: 01/08/1961 DOA: 09/02/2020 PCP: [facility] [employer]    No chief complaint on file.   Brief Narrative:   Katherine Estes is an 60 y.o. female hypertension, diabetes mellitus, asthma, anemia, kidney stone, CKD stage II, hypothyroidism, who was admitted by Dr. Mack Estes of Ortho for right knee replacement on 6/14. Pt developed vertigo after surgery.  So [facility] hospitalist consulted to evaluate, MRI with no acute findings, her symptoms has resolved.  Assessment & Plan:   Principal Problem:   S/P TKR (total knee replacement) using cement, right Active Problems:   Hypothyroid   Hypertension   Asthma   Acute renal failure superimposed on stage 2 chronic kidney disease (HCC)   Vertigo   Normocytic anemia   Type II diabetes mellitus with renal manifestations (HCC)     S/P TKR (total knee replacement) using cement, right: -pain management per primary Ortho team -Patient is on Lovenox for DVT prophylaxis   Vertigo:  -This appears to be has resolved, improved with IV fluids, holding antihypertensive medications, MRI brain with no acute findings.   -She denies any recurrence of her symptoms  -She is not orthostatic  Fever  -She is nontoxic-appearing, no evidence of infection, UA is negative, chest x-ray with no pneumonia. -Procalcitonin is reassuring, within normal limit and trending down, no indication for antibiotics, no evidence for infection, resolved  Dyspnea -Chronic dyspnea on exertion, this is at her baseline, this is most likely due to deconditioning, she denies any orthopnea, she did receive 1 dose of 20 mg Lasix 6/16 given evidence of some vascular congestion on chest x-ray. -Currently at baseline, she denies any orthopnea, reports exertional dyspnea at baseline, there is no signs of volume overload, but she is +1.3 L since admission, I will continue to monitor closely without any further diuresis today  given soft blood pressure, no hypoxia, and no dyspnea or any other evidence of volume overload.  GERD -Patient does report cough, upon laying supine, findings significant for GERD, she is started on Protonix.  Hypertension:  -Blood pressure is soft, so all her antihypertensive medication has been discontinued including hydrochlorothiazide, Cozaar and amlodipine  -She resumed back on hydrochlorothiazide 6/18, blood pressure remains elevated, so she is back on Cozaar today as well.  Asthma: Stable.  Current oxygen saturation 98% on room air -Bronchodilators, Singulair -As needed Mucinex   Hypothyroid -Synthroid   Acute renal failure superimposed on stage 2 chronic kidney disease ([place]):  -Recent baseline creatinine 0.77 on 08/28/2020.  Her creatinine is 1.20, BUN 18.   Normocytic anemia: Hemoglobin 11.2 on 09/02/2020.   -Follow-up with CBC   Type II diabetes mellitus with renal manifestations: Recent A1c 6.2, well controlled.  Patient taking metformin -Sliding scale insulin    Hypokalemia -Repleted     DVT prophylaxis: Lovenox Code Status: Full Family Communication: none at bedside Disposition: Per primary orthopedic team.     Consultants:  [facility] for medical management   Subjective:  Patient is afebrile, she does report some cough while laying supine, denies any fever or chills.  Objective: Vitals:   09/06/20 1932 09/06/20 2316 09/07/20 0501 09/07/20 0745  BP: 129/73 (!) 144/76 (!) 144/83 (!) 142/85  Pulse: 92 88 85 80  Resp: 18 18 18 16   Temp: 98.2 F (36.8 C) 98.1 F (36.7 C) 98.4 F (36.9 C) 98.5 F (36.9 C)  TempSrc: Oral Oral Oral Oral  SpO2: 96% 96% 97% 96%  Weight:  Height:        Intake/Output Summary (Last 24 hours) at 09/07/2020 1428 Last data filed at 09/07/2020 1023 Gross per 24 hour  Intake 360 ml  Output 1150 ml  Net -790 ml   Filed Weights   09/02/20 0703  Weight: 116.4 kg    Examination:  Awake Alert, Oriented X 3, No  new F.N deficits, Normal affect Symmetrical Chest wall movement, Good air movement bilaterally, CTAB RRR,No Gallops,Rubs or new Murmurs, No Parasternal Heave +ve B.Sounds, Abd Soft, No tenderness, No rebound - guarding or rigidity. No Cyanosis, right lower right lower extremity on immobilizer      Data Reviewed: I have personally reviewed following labs and imaging studies  CBC: Recent Labs  Lab 09/02/20 0707 09/03/20 0419 09/04/20 0401 09/05/20 0448 09/06/20 0419  WBC  --  9.6 12.0* 11.3* 11.5*  HGB 11.2* 9.8* 10.1* 9.2* 9.2*  HCT 33.0* 30.6* 31.2* 27.8* 28.5*  MCV  --  91.1 91.0 89.4 90.2  PLT  --  195 208 229 193    Basic Metabolic Panel: Recent Labs  Lab 09/02/20 0707 09/03/20 0419 09/04/20 0401 09/05/20 0448 09/06/20 0419  NA 138 134* 134* 137 136  K 3.0* 4.0 3.7 3.4* 4.0  CL 102 105 102 103 101  CO2  --  24 24 27 29   GLUCOSE 125* 131* 121* 128* 122*  BUN 15 18 14 10 11   CREATININE 1.20* 1.20* 0.90 0.80 0.81  CALCIUM  --  7.7* 8.3* 8.2* 8.3*    GFR: Estimated Creatinine Clearance: 96.9 mL/min (by C-G formula based on SCr of 0.81 mg/dL).  Liver Function Tests: No results for input(s): AST, ALT, ALKPHOS, BILITOT, PROT, ALBUMIN in the last 168 hours.  CBG: Recent Labs  Lab 09/06/20 1223 09/06/20 1735 09/06/20 2034 09/07/20 0745 09/07/20 1316  GLUCAP 173* 85 121* 117* 117*     Recent Results (from the past 240 hour(s))  SARS CORONAVIRUS 2 (TAT 6-24 HRS) Nasopharyngeal Nasopharyngeal Swab     Status: None   Collection Time: 08/29/20 11:29 AM   Specimen: Nasopharyngeal Swab  Result Value Ref Range Status   SARS Coronavirus 2 NEGATIVE NEGATIVE Final    Comment: (NOTE) SARS-CoV-2 target nucleic acids are NOT DETECTED.  The SARS-CoV-2 RNA is generally detectable in upper and lower respiratory specimens during the acute phase of infection. Negative results do not preclude SARS-CoV-2 infection, do not rule out co-infections with other pathogens, and  should not be used as the sole basis for treatment or other patient management decisions. Negative results must be combined with clinical observations, patient history, and epidemiological information. The expected result is Negative.  Fact Sheet for Patients: SugarRoll.be  Fact Sheet for Healthcare Providers: https://www.woods-mathews.com/  This test is not yet approved or cleared by the [place] FDA and  has been authorized for detection and/or diagnosis of SARS-CoV-2 by FDA under an Emergency Use Authorization (EUA). This EUA will remain  in effect (meaning this test can be used) for the duration of the COVID-19 declaration under Se ction 564(b)(1) of the Act, 21 U.S.C. section 360bbb-3(b)(1), unless the authorization is terminated or revoked sooner.  Performed at [facility], [place] [address] [place], [place] [address]          Radiology Studies: No results found.      Scheduled Meds:  cholecalciferol  1,000 Units Oral Daily   docusate sodium  100 mg Oral BID   enoxaparin (LOVENOX) injection  40 mg Subcutaneous Q24H  hydrochlorothiazide  25 mg Oral Daily   insulin aspart  0-5 Units Subcutaneous QHS   insulin aspart  0-9 Units Subcutaneous TID WC   levothyroxine  150 mcg Oral QAC breakfast   losartan  50 mg Oral Daily   mometasone-formoterol  2 puff Inhalation BID   montelukast  10 mg Oral QHS   pantoprazole  40 mg Oral Daily   potassium chloride  10 mEq Oral Daily   traMADol  50 mg Oral Q6H   Continuous Infusions:  methocarbamol (ROBAXIN) IV     promethazine (PHENERGAN) injection (IM or IVPB)       LOS: 4 days       Phillips Climes, MD [facility]   To contact the attending provider between 7A-7P or the covering provider during after hours 7P-7A, please log into the web site [email or web address] and access using universal [place] password for that web site. If you do not have the password,  please call the hospital operator.  09/07/2020, 2:28 PM

## 2020-09-07 NOTE — Progress Notes (Signed)
  Subjective: Patient seen at approximately 1 PM today. POD #5 s/p right total knee arthroplasty.   Patient reports right knee pain as mild to moderate.  Patient is making progress with physical therapy.  The plan is for her to go home upon discharge.  Objective:   VITALS:   Vitals:   09/06/20 2316 09/07/20 0501 09/07/20 0745 09/07/20 1555  BP: (!) 144/76 (!) 144/83 (!) 142/85 116/81  Pulse: 88 85 80 91  Resp: 18 18 16 18   Temp: 98.1 F (36.7 C) 98.4 F (36.9 C) 98.5 F (36.9 C) 98.5 F (36.9 C)  TempSrc: Oral Oral Oral Oral  SpO2: 96% 97% 96% 98%  Weight:      Height:        PHYSICAL EXAM: Right lower extremity Neurovascular intact Sensation intact distally Intact pulses distally Dorsiflexion/Plantar flexion intact Aquacel dressing: scant drainage No cellulitis present Compartment soft  LABS  Results for orders placed or performed during the hospital encounter of 09/02/20 (from the past 24 hour(s))  Glucose, capillary     Status: Abnormal   Collection Time: 09/06/20  8:34 PM  Result Value Ref Range   Glucose-Capillary 121 (H) 70 - 99 mg/dL  Glucose, capillary     Status: Abnormal   Collection Time: 09/07/20  7:45 AM  Result Value Ref Range   Glucose-Capillary 117 (H) 70 - 99 mg/dL  Glucose, capillary     Status: Abnormal   Collection Time: 09/07/20  1:16 PM  Result Value Ref Range   Glucose-Capillary 117 (H) 70 - 99 mg/dL  Glucose, capillary     Status: None   Collection Time: 09/07/20  4:30 PM  Result Value Ref Range   Glucose-Capillary 93 70 - 99 mg/dL    No results found.  Assessment/Plan: 5 Days Post-Op   Principal Problem:   S/P TKR (total knee replacement) using cement, right Active Problems:   Hypothyroid   Hypertension   Asthma   Acute renal failure superimposed on stage 2 chronic kidney disease (HCC)   Vertigo   Normocytic anemia   Type II diabetes mellitus with renal manifestations ([place])  Patient is progressing postop.  I feel that she  will be ready for discharge home tomorrow after PT. continue Lovenox and current pain management.      Thornton [place] , MD 09/07/2020, 5:38 PM

## 2020-09-08 LAB — GLUCOSE, CAPILLARY
Glucose-Capillary: 103 mg/dL — ABNORMAL HIGH (ref 70–99)
Glucose-Capillary: 155 mg/dL — ABNORMAL HIGH (ref 70–99)

## 2020-09-08 MED ORDER — ENOXAPARIN SODIUM 40 MG/0.4ML IJ SOSY: 40.0000 mg | PREFILLED_SYRINGE | INTRAMUSCULAR | 0 refills | Status: DC

## 2020-09-08 MED ORDER — DM-GUAIFENESIN ER 30-600 MG PO TB12: 1.0000 | ORAL_TABLET | Freq: Two times a day (BID) | ORAL | 0 refills | Status: DC | PRN

## 2020-09-08 MED ORDER — DOCUSATE SODIUM 100 MG PO CAPS: 100.0000 mg | ORAL_CAPSULE | Freq: Two times a day (BID) | ORAL | 0 refills | Status: DC

## 2020-09-08 MED ORDER — OXYCODONE HCL 5 MG PO TABS: 5.0000 mg | ORAL_TABLET | ORAL | 0 refills | Status: AC | PRN

## 2020-09-08 MED ORDER — ACETAMINOPHEN 325 MG PO TABS: 325.0000 mg | ORAL_TABLET | Freq: Four times a day (QID) | ORAL | 0 refills | Status: AC | PRN

## 2020-09-08 MED ORDER — BISACODYL 5 MG PO TBEC: 10.0000 mg | DELAYED_RELEASE_TABLET | Freq: Every day | ORAL | 0 refills | Status: DC | PRN

## 2020-09-08 NOTE — Progress Notes (Signed)
OT Cancellation Note  Patient Details Name: Katherine Estes MRN: [id number] DOB: [birth year]   Cancelled Treatment:    Reason Eval/Treat Not Completed: Patient declined, no reason specified. Upon attempt, pt up in recliner, denies needs at this time. Reports eager to return home today. Politely declines additional OT tx at this time. Will re-attempt at later date/time as appropriate.   Hanley Hays, MPH, MS, OTR/L ascom [phone] 09/08/20, 11:49 AM

## 2020-09-08 NOTE — Discharge Summary (Signed)
Physician Discharge Summary  Patient ID: Katherine Estes MRN: [id number] DOB/AGE: [birth year] 60 y.o.  Admit date: 09/02/2020 Discharge date: 09/08/2020  Admission Diagnoses:  Right Knee Osteoarthritis S/P TKR (total knee replacement) using cement, right  Discharge Diagnoses:  Right Knee Osteoarthritis Principal Problem:   S/P TKR (total knee replacement) using cement, right Active Problems:   Hypothyroid   Hypertension   Asthma   Acute renal failure superimposed on stage 2 chronic kidney disease (HCC)   Vertigo   Normocytic anemia   Type II diabetes mellitus with renal manifestations ([place])   Past Medical History:  Diagnosis Date   Allergy    Anemia    Arthritis    Asthma    Diabetes mellitus without complication ([place])    H/O nephrolithotomy with removal of calculi    Hypertension    Hypothyroidism    Osteoporosis    Thyroid disease     Surgeries: Procedure(s): RIGHT TOTAL KNEE ARTHROPLASTY on 09/02/2020   Consultants (if any):   Discharged Condition: Improved  Hospital Course: Katherine Estes is an 60 y.o. female who was admitted 09/02/2020 with a diagnosis of  Right Knee Osteoarthritis S/P TKR (total knee replacement) using cement, right and went to the operating room on 09/02/2020 and underwent an uncomplicated right total knee arthroplasty.    She was given perioperative antibiotics:  Anti-infectives (From admission, onward)    Start     Dose/Rate Route Frequency Ordered Stop   09/02/20 1400  ceFAZolin (ANCEF) IVPB 2g/100 mL premix        2 g 200 mL/hr over 30 Minutes Intravenous Every 6 hours 09/02/20 1242 09/02/20 2113   09/02/20 0600  ceFAZolin (ANCEF) IVPB 2g/100 mL premix        2 g 200 mL/hr over 30 Minutes Intravenous On call to O.R. 09/02/20 0236 09/02/20 0848   09/02/20 0245  clindamycin (CLEOCIN) IVPB 900 mg        900 mg 100 mL/hr over 30 Minutes Intravenous  Once 09/02/20 0235 09/02/20 0757     .  She was given sequential compression  devices, early ambulation, and Lovenox for DVT prophylaxis.  Patient had dizziness/vertigo postop and was evaluated by the medical team.  MRI was negative for intracranial abnormality.  Adjustments were made to the patient's antihypertensive medications. As the patient's hospitalization was uneventful.  She had slow progress with physical therapy due to postoperative pain.  Patient was made for discharge home with outpatient physical therapy by postop day #6.  She benefited maximally from the hospital stay and there were no complications.    Recent vital signs:  Vitals:   09/08/20 0521 09/08/20 0826  BP: 126/76 134/84  Pulse: 85 87  Resp: 18 18  Temp: 98.1 F (36.7 C) 97.6 F (36.4 C)  SpO2: 96% 100%    Recent laboratory studies:  Lab Results  Component Value Date   HGB 9.2 (L) 09/06/2020   HGB 9.2 (L) 09/05/2020   HGB 10.1 (L) 09/04/2020   Lab Results  Component Value Date   WBC 11.5 (H) 09/06/2020   PLT 262 09/06/2020   Lab Results  Component Value Date   INR 0.9 08/28/2020   Lab Results  Component Value Date   NA 136 09/06/2020   K 4.0 09/06/2020   CL 101 09/06/2020   CO2 29 09/06/2020   BUN 11 09/06/2020   CREATININE 0.81 09/06/2020   GLUCOSE 122 (H) 09/06/2020    Discharge Medications:   Allergies as of 09/08/2020  Reactions   Cinnamon    "not good"   Penicillins Hives, Rash        Medication List     STOP taking these medications    amLODipine 5 MG tablet Commonly known as: NORVASC   predniSONE 10 MG tablet Commonly known as: DELTASONE       TAKE these medications    acetaminophen 325 MG tablet Commonly known as: TYLENOL Take 1-2 tablets (325-650 mg total) by mouth every 6 (six) hours as needed for mild pain (pain score 1-3 or temp > 100.5).   albuterol 108 (90 Base) MCG/ACT inhaler Commonly known as: VENTOLIN HFA Inhale 1-2 puffs into the lungs every 6 (six) hours as needed for wheezing or shortness of breath.   benzonatate  200 MG capsule Commonly known as: TESSALON Take 200 mg by mouth 3 (three) times daily as needed for cough.   bisacodyl 5 MG EC tablet Commonly known as: DULCOLAX Take 2 tablets (10 mg total) by mouth daily as needed for moderate constipation.   cholecalciferol 1000 units tablet Commonly known as: VITAMIN D Take 1,000 Units by mouth daily.   dextromethorphan-guaiFENesin 30-600 MG 12hr tablet Commonly known as: MUCINEX DM Take 1 tablet by mouth 2 (two) times daily as needed for up to 15 doses for cough.   docusate sodium 100 MG capsule Commonly known as: COLACE Take 1 capsule (100 mg total) by mouth 2 (two) times daily.   enoxaparin 40 MG/0.4ML injection Commonly known as: LOVENOX Inject 0.4 mLs (40 mg total) into the skin daily. Start taking on: September 09, 2020   Fluticasone-Salmeterol 500-50 MCG/DOSE Aepb Commonly known as: ADVAIR Inhale 1 puff into the lungs daily as needed (shortness of breath or wheezing).   hydrochlorothiazide 25 MG tablet Commonly known as: HYDRODIURIL Take 25 mg by mouth daily.   levothyroxine 150 MCG tablet Commonly known as: SYNTHROID Take 150 mcg by mouth daily before breakfast.   losartan 50 MG tablet Commonly known as: COZAAR Take 50 mg by mouth daily.   metFORMIN 500 MG tablet Commonly known as: GLUCOPHAGE Take 500 mg by mouth daily with breakfast.   montelukast 10 MG tablet Commonly known as: SINGULAIR Take 10 mg by mouth at bedtime.   oxyCODONE 5 MG immediate release tablet Commonly known as: Oxy IR/ROXICODONE Take 1-2 tablets (5-10 mg total) by mouth every 4 (four) hours as needed for moderate pain (pain score 4-6).   pantoprazole 40 MG tablet Commonly known as: PROTONIX Take 1 tablet (40 mg total) by mouth daily.   potassium chloride 10 MEQ tablet Commonly known as: KLOR-CON Take 10 mEq by mouth daily.   triamcinolone cream 0.1 % Commonly known as: KENALOG Apply 1 application topically 2 (two) times daily.                Durable Medical Equipment  (From admission, onward)           Start     Ordered   09/05/20 1509  For home use only DME Walker rolling  Once       Question Answer Comment  Walker: With 5 Inch Wheels   Patient needs a walker to treat with the following condition Weakness      09/05/20 1508            Diagnostic Studies: DG Chest 2 View  Result Date: 08/14/2020 CLINICAL DATA:  Shortness of breath and cough EXAM: CHEST - 2 VIEW COMPARISON:  03/27/2018 FINDINGS: Cardiac shadow is stable. Mild central vascular  congestion is noted without edema. The lungs are well aerated bilaterally. No focal infiltrate or effusion is seen. No acute bony abnormality is noted. IMPRESSION: Mild central vascular congestion without edema. No other focal abnormality is noted. Electronically Signed   By: Inez Catalina M.D.   On: 08/14/2020 08:46   MR BRAIN WO CONTRAST  Result Date: 09/03/2020 CLINICAL DATA:  Dizziness. EXAM: MRI HEAD WITHOUT CONTRAST TECHNIQUE: Multiplanar, multiecho pulse sequences of the brain and surrounding structures were obtained without intravenous contrast. COMPARISON:  None. FINDINGS: Intermittently motion limited study.  Within this limitation: Brain: No acute infarction, hemorrhage, hydrocephalus, extra-axial collection or mass lesion. Vascular: Major arterial flow voids are maintained at the skull base. Skull and upper cervical spine: Normal marrow signal. Sinuses/Orbits: Mild ethmoid air cell mucosal thickening. Otherwise, clear sinuses. Unremarkable orbits. Other: No mastoid effusions. IMPRESSION: No evidence of acute intracranial abnormality on this motion limited study. Electronically Signed   By: Margaretha Sheffield MD   On: 09/03/2020 15:27   DG Chest Port 1 View  Result Date: 09/04/2020 CLINICAL DATA:  Fever.  Shortness of breath. EXAM: PORTABLE CHEST 1 VIEW COMPARISON:  Aug 14, 2020. FINDINGS: Enlarged cardiac silhouette. Prominent right paratracheal stripe likely related  to vascular structure accentuated by patient rotation. Pulmonary vascular congestion no consolidation. No visible pleural effusions or pneumothorax on this single AP radiograph. IMPRESSION: Cardiomegaly and pulmonary vascular congestion without overt pulmonary edema. Electronically Signed   By: Margaretha Sheffield MD   On: 09/04/2020 08:14   DG Knee Right Port  Result Date: 09/02/2020 CLINICAL DATA:  Status post right knee replacement today. EXAM: PORTABLE RIGHT KNEE - 1-2 VIEW COMPARISON:  None. FINDINGS: Total arthroplasty is in place. No hardware complication or bony abnormality is seen. Gas in the soft tissues and surgical staples noted. IMPRESSION: Status post right knee replacement.  No acute finding. Electronically Signed   By: Inge Rise M.D.   On: 09/02/2020 12:45    Disposition: Discharge disposition: 01-Home or Self Care       Discharge Instructions     Call MD / Call 911   Complete by: As directed    If you experience chest pain or shortness of breath, CALL 911 and be transported to the hospital emergency room.  If you develope a fever above 101 F, pus (white drainage) or increased drainage or redness at the wound, or calf pain, call your surgeon's office.   Constipation Prevention   Complete by: As directed    Drink plenty of fluids.  Prune juice may be helpful.  You may use a stool softener, such as Colace (over the counter) 100 mg twice a day.  Use MiraLax (over the counter) for constipation as needed.   Diet general   Complete by: As directed    Discharge instructions   Complete by: As directed    The patient may continue to bear weight on the right lower extremity with use of a walker. The patient should continue to use TED stockings until follow-up. Patient should remove the TED stockings at night for sleep. The patient needs to continue to elevate the right lower extremity whenever possible. The knee immobilizer should be used at night. The patient may remove the knee  immobilizer to perform exercises or sit in a chair during the day.  Patient should not place a pillow under their knee. The Polar Care may be used by the patient for comfort.  The dressing should remain on until follow up in the office.  The patient must cover the right knee dressing/incision during showers with a plastic bag or Saran wrap.  The patient will take lovenox 40 mg injection once  a day for blood clot prevention and continue to work on knee range of motion exercises at home as instructed by physical therapy until follow-up in the office.   Driving restrictions   Complete by: As directed    No driving for 8-45 weeks   Increase activity slowly as tolerated   Complete by: As directed    Lifting restrictions   Complete by: As directed    No lifting for 12-16 weeks   Post-operative opioid taper instructions:   Complete by: As directed    POST-OPERATIVE OPIOID TAPER INSTRUCTIONS: It is important to wean off of your opioid medication as soon as possible. If you do not need pain medication after your surgery it is ok to stop day one. Opioids include: Codeine, Hydrocodone(Norco, Vicodin), Oxycodone(Percocet, oxycontin) and hydromorphone amongst others.  Long term and even short term use of opiods can cause: Increased pain response Dependence Constipation Depression Respiratory depression And more.  Withdrawal symptoms can include Flu like symptoms Nausea, vomiting And more Techniques to manage these symptoms Hydrate well Eat regular healthy meals Stay active Use relaxation techniques(deep breathing, meditating, yoga) Do Not substitute Alcohol to help with tapering If you have been on opioids for less than two weeks and do not have pain than it is ok to stop all together.  Plan to wean off of opioids This plan should start within one week post op of your joint replacement. Maintain the same interval or time between taking each dose and first decrease the dose.  Cut the total  daily intake of opioids by one tablet each day Next start to increase the time between doses. The last dose that should be eliminated is the evening dose.             Signed: Thornton Park ,MD 09/08/2020, 3:07 PM

## 2020-09-08 NOTE — Progress Notes (Signed)
  Subjective:  POD #6 s/p right knee arthroplasty.   Patient reports right knee pain as mild.    Objective:   VITALS:   Vitals:   09/07/20 1555 09/07/20 2111 09/08/20 0521 09/08/20 0826  BP: 116/81 114/66 126/76 134/84  Pulse: 91 89 85 87  Resp: 18 18 18 18   Temp: 98.5 F (36.9 C) 98.2 F (36.8 C) 98.1 F (36.7 C) 97.6 F (36.4 C)  TempSrc: Oral Oral Oral   SpO2: 98% 97% 96% 100%  Weight:      Height:        PHYSICAL EXAM: Right lower extremity Neurovascular intact Sensation intact distally Intact pulses distally Dorsiflexion/Plantar flexion intact Aquacel dressing: scant drainage No cellulitis present Compartment soft  LABS  Results for orders placed or performed during the hospital encounter of 09/02/20 (from the past 24 hour(s))  Glucose, capillary     Status: None   Collection Time: 09/07/20  4:30 PM  Result Value Ref Range   Glucose-Capillary 93 70 - 99 mg/dL  Glucose, capillary     Status: Abnormal   Collection Time: 09/07/20  9:08 PM  Result Value Ref Range   Glucose-Capillary 156 (H) 70 - 99 mg/dL   Comment 1 Notify RN    Comment 2 Document in Chart   Glucose, capillary     Status: Abnormal   Collection Time: 09/08/20  7:44 AM  Result Value Ref Range   Glucose-Capillary 103 (H) 70 - 99 mg/dL  Glucose, capillary     Status: Abnormal   Collection Time: 09/08/20 11:42 AM  Result Value Ref Range   Glucose-Capillary 155 (H) 70 - 99 mg/dL    No results found.  Assessment/Plan: 6 Days Post-Op   Principal Problem:   S/P TKR (total knee replacement) using cement, right Active Problems:   Hypothyroid   Hypertension   Asthma   Acute renal failure superimposed on stage 2 chronic kidney disease (HCC)   Vertigo   Normocytic anemia   Type II diabetes mellitus with renal manifestations ([place])   Patient has improved steadily postop.  She is now ready for discharge home.  She will be discharged on Lovenox 40 mg daily for DVT prophylaxis.  Patient will  have her antihypertensive medications adjusted per the hospitalist.  He will follow-up in our office with me on 09-15-2020 11:30 AM   Thornton Park , MD 09/08/2020, 2:53 PM

## 2020-09-08 NOTE — Progress Notes (Signed)
Physical Therapy Treatment Patient Details Name: Katherine Estes MRN: [id number] DOB: 1960-08-17 Today's Date: 09/08/2020    History of Present Illness Pt is s/p R TKA on 09/02/20.    PT Comments    Pt able to complete lap around unit today with slow but step through gait.  Participated in exercises as described below.  Stressed importance of knee flexion at home.  Discussed home safety, ex's and questions answered.   Follow Up Recommendations  Home health PT     Equipment Recommendations  Rolling walker with 5" wheels;3in1 (PT)    Recommendations for Other Services       Precautions / Restrictions Precautions Precautions: Knee;Fall Required Braces or Orthoses: Knee Immobilizer - Right Restrictions Weight Bearing Restrictions: Yes RLE Weight Bearing: Weight bearing as tolerated    Mobility  Bed Mobility               General bed mobility comments: in reclienr before and after    Transfers Overall transfer level: Needs assistance Equipment used: Rolling walker (2 wheeled) Transfers: Sit to/from Stand Sit to Stand: Min guard;Supervision            Ambulation/Gait Ambulation/Gait assistance: Supervision Gait Distance (Feet): 220 Feet Assistive device: Rolling walker (2 wheeled) Gait Pattern/deviations: Step-through pattern;Decreased step length - right;Decreased step length - left     General Gait Details: gait progressed to step through pattern.  did better with walker position with no cues needed today.   Stairs         General stair comments: pt now has bed on 1st floor until she feels comfortable with stairs.   Wheelchair Mobility    Modified Rankin (Stroke Patients Only)       Balance Overall balance assessment: Modified Independent                                          Cognition Arousal/Alertness: Awake/alert Behavior During Therapy: WFL for tasks assessed/performed Overall Cognitive Status: Within  Functional Limits for tasks assessed                                        Exercises Total Joint Exercises Ankle Circles/Pumps: AROM;Strengthening;Both;10 reps;Supine Quad Sets: AROM;Strengthening;Both;10 reps;Supine Gluteal Sets: AROM;Strengthening;Both;10 reps;Supine Heel Slides: Strengthening;Both;10 reps;Supine;AAROM Hip ABduction/ADduction: AROM;Strengthening;Both;10 reps;Supine Straight Leg Raises: AAROM;Strengthening;Right;10 reps Knee Flexion: AAROM;Strengthening;Right;10 reps Goniometric ROM: 2-70 - self limits due to pain    General Comments        Pertinent Vitals/Pain Pain Assessment: Faces Faces Pain Scale: Hurts little more Pain Location: Medial R Knee Pain Descriptors / Indicators: Aching Pain Intervention(s): Limited activity within patient's tolerance;Monitored during session;Premedicated before session;Repositioned;Ice applied    Home Living                      Prior Function            PT Goals (current goals can now be found in the care plan section) Progress towards PT goals: Progressing toward goals    Frequency    BID      PT Plan Current plan remains appropriate    Co-evaluation              AM-PAC PT "6 Clicks" Mobility   Outcome Measure  Help needed turning from your  back to your side while in a flat bed without using bedrails?: None Help needed moving from lying on your back to sitting on the side of a flat bed without using bedrails?: None Help needed moving to and from a bed to a chair (including a wheelchair)?: A Little Help needed standing up from a chair using your arms (e.g., wheelchair or bedside chair)?: A Little Help needed to walk in hospital room?: A Little Help needed climbing 3-5 steps with a railing? : A Lot 6 Click Score: 19    End of Session Equipment Utilized During Treatment: Gait belt Activity Tolerance: Patient tolerated treatment well;No increased pain Patient left: in  bed;with call bell/phone within reach;with bed alarm set;with family/visitor present;with SCD's reapplied Nurse Communication: Mobility status PT Visit Diagnosis: Unsteadiness on feet (R26.81);Other abnormalities of gait and mobility (R26.89);Muscle weakness (generalized) (M62.81);Difficulty in walking, not elsewhere classified (R26.2);Pain Pain - Right/Left: Right Pain - part of body: Knee     Time: 3700-5259 PT Time Calculation (min) (ACUTE ONLY): 33 min  Charges:  $Gait Training: 8-22 mins $Therapeutic Exercise: 8-22 mins                    Chesley Noon, PTA 09/08/20, 12:45 PM 09/08/2020, 12:43 PM

## 2020-09-08 NOTE — TOC Progression Note (Signed)
Transition of Care [facility]) - Progression Note    Patient Details  Name: Katherine Estes MRN: [id number] Date of Birth: 1960/05/29  Transition of Care [facility]) CM/SW [place], RN Phone Number: 09/08/2020, 3:34 PM  Clinical Narrative:     Patient will DC home with Outpatient PT set up, has transportation and Brother will help her       Expected Discharge Plan and Services           Expected Discharge Date: 09/08/20                                     Social Determinants of Health (SDOH) Interventions    Readmission Risk Interventions No flowsheet data found.

## 2020-09-15 ENCOUNTER — Emergency Department: Payer: Medicaid Other

## 2020-09-15 ENCOUNTER — Other Ambulatory Visit: Payer: Self-pay

## 2020-09-15 ENCOUNTER — Encounter: Payer: Self-pay | Admitting: Radiology

## 2020-09-15 ENCOUNTER — Inpatient Hospital Stay
Admission: EM | Admit: 2020-09-15 | Discharge: 2020-09-19 | DRG: 872 | Disposition: A | Discharge status: Home / Self Care | Payer: Medicaid Other | Attending: Internal Medicine | Admitting: Internal Medicine

## 2020-09-15 DIAGNOSIS — D649 Anemia, unspecified: Secondary | ICD-10-CM | POA: Diagnosis present

## 2020-09-15 DIAGNOSIS — A419 Sepsis, unspecified organism: Principal | ICD-10-CM | POA: Diagnosis present

## 2020-09-15 DIAGNOSIS — N179 Acute kidney failure, unspecified: Secondary | ICD-10-CM | POA: Diagnosis present

## 2020-09-15 DIAGNOSIS — Z6841 Body Mass Index (BMI) 40.0 and over, adult: Secondary | ICD-10-CM

## 2020-09-15 DIAGNOSIS — E876 Hypokalemia: Secondary | ICD-10-CM | POA: Diagnosis present

## 2020-09-15 DIAGNOSIS — Z87891 Personal history of nicotine dependence: Secondary | ICD-10-CM

## 2020-09-15 DIAGNOSIS — M7981 Nontraumatic hematoma of soft tissue: Secondary | ICD-10-CM | POA: Diagnosis present

## 2020-09-15 DIAGNOSIS — Z880 Allergy status to penicillin: Secondary | ICD-10-CM

## 2020-09-15 DIAGNOSIS — M25461 Effusion, right knee: Secondary | ICD-10-CM | POA: Diagnosis present

## 2020-09-15 DIAGNOSIS — E890 Postprocedural hypothyroidism: Secondary | ICD-10-CM | POA: Diagnosis present

## 2020-09-15 DIAGNOSIS — Z79899 Other long term (current) drug therapy: Secondary | ICD-10-CM

## 2020-09-15 DIAGNOSIS — J45909 Unspecified asthma, uncomplicated: Secondary | ICD-10-CM | POA: Diagnosis present

## 2020-09-15 DIAGNOSIS — Z91018 Allergy to other foods: Secondary | ICD-10-CM

## 2020-09-15 DIAGNOSIS — Z79890 Hormone replacement therapy (postmenopausal): Secondary | ICD-10-CM

## 2020-09-15 DIAGNOSIS — M7989 Other specified soft tissue disorders: Secondary | ICD-10-CM

## 2020-09-15 DIAGNOSIS — Z7984 Long term (current) use of oral hypoglycemic drugs: Secondary | ICD-10-CM

## 2020-09-15 DIAGNOSIS — E119 Type 2 diabetes mellitus without complications: Secondary | ICD-10-CM | POA: Diagnosis present

## 2020-09-15 DIAGNOSIS — I10 Essential (primary) hypertension: Secondary | ICD-10-CM | POA: Diagnosis present

## 2020-09-15 DIAGNOSIS — M810 Age-related osteoporosis without current pathological fracture: Secondary | ICD-10-CM | POA: Diagnosis present

## 2020-09-15 DIAGNOSIS — Z8249 Family history of ischemic heart disease and other diseases of the circulatory system: Secondary | ICD-10-CM

## 2020-09-15 DIAGNOSIS — D75839 Thrombocytosis, unspecified: Secondary | ICD-10-CM | POA: Diagnosis present

## 2020-09-15 DIAGNOSIS — E1129 Type 2 diabetes mellitus with other diabetic kidney complication: Secondary | ICD-10-CM

## 2020-09-15 DIAGNOSIS — Z96651 Presence of right artificial knee joint: Secondary | ICD-10-CM | POA: Diagnosis present

## 2020-09-15 DIAGNOSIS — Z20822 Contact with and (suspected) exposure to covid-19: Secondary | ICD-10-CM | POA: Diagnosis present

## 2020-09-15 LAB — DIFFERENTIAL
Abs Immature Granulocytes: 0.21 K/uL — ABNORMAL HIGH (ref 0.00–0.07)
Basophils Absolute: 0 K/uL (ref 0.0–0.1)
Basophils Relative: 0 %
Eosinophils Absolute: 0.1 K/uL (ref 0.0–0.5)
Eosinophils Relative: 0 %
Immature Granulocytes: 1 %
Lymphocytes Relative: 3 %
Lymphs Abs: 0.5 K/uL — ABNORMAL LOW (ref 0.7–4.0)
Monocytes Absolute: 0.4 K/uL (ref 0.1–1.0)
Monocytes Relative: 2 %
Neutro Abs: 18.1 K/uL — ABNORMAL HIGH (ref 1.7–7.7)
Neutrophils Relative %: 94 %

## 2020-09-15 LAB — LACTIC ACID, PLASMA: Lactic Acid, Venous: 2.4 mmol/L (ref 0.5–1.9)

## 2020-09-15 LAB — CBC
HCT: 33.7 % — ABNORMAL LOW (ref 36.0–46.0)
Hemoglobin: 10.8 g/dL — ABNORMAL LOW (ref 12.0–15.0)
MCH: 28.9 pg (ref 26.0–34.0)
MCHC: 32 g/dL (ref 30.0–36.0)
MCV: 90.1 fL (ref 80.0–100.0)
Platelets: 440 10*3/uL — ABNORMAL HIGH (ref 150–400)
RBC: 3.74 MIL/uL — ABNORMAL LOW (ref 3.87–5.11)
RDW: 14.9 % (ref 11.5–15.5)
WBC: 19.2 10*3/uL — ABNORMAL HIGH (ref 4.0–10.5)
nRBC: 0 % (ref 0.0–0.2)

## 2020-09-15 LAB — LIPASE, BLOOD: Lipase: 28 U/L (ref 11–51)

## 2020-09-15 MED ORDER — ONDANSETRON HCL 4 MG/2ML IJ SOLN
4.0000 mg | Freq: Once | INTRAMUSCULAR | Status: AC
  Administered 2020-09-16: 4 mg via INTRAVENOUS
  Filled 2020-09-15: qty 2

## 2020-09-15 MED ORDER — LACTATED RINGERS IV BOLUS (SEPSIS)
1000.0000 mL | Freq: Once | INTRAVENOUS | Status: AC
  Administered 2020-09-15: 1000 mL via INTRAVENOUS

## 2020-09-15 MED ORDER — METRONIDAZOLE 500 MG/100ML IV SOLN
500.0000 mg | Freq: Once | INTRAVENOUS | Status: AC
  Administered 2020-09-15: 500 mg via INTRAVENOUS
  Filled 2020-09-15: qty 100

## 2020-09-15 MED ORDER — LACTATED RINGERS IV BOLUS (SEPSIS)
1000.0000 mL | Freq: Once | INTRAVENOUS | Status: AC
  Administered 2020-09-16: 1000 mL via INTRAVENOUS

## 2020-09-15 MED ORDER — LACTATED RINGERS IV BOLUS (SEPSIS)
500.0000 mL | Freq: Once | INTRAVENOUS | Status: AC
  Administered 2020-09-16: 500 mL via INTRAVENOUS

## 2020-09-15 MED ORDER — VANCOMYCIN HCL IN DEXTROSE 1-5 GM/200ML-% IV SOLN: 1000.0000 mg | Freq: Once | INTRAVENOUS | Status: DC

## 2020-09-15 MED ORDER — SODIUM CHLORIDE 0.9 % IV SOLN
2.0000 g | Freq: Once | INTRAVENOUS | Status: AC
  Administered 2020-09-16: 2 g via INTRAVENOUS
  Filled 2020-09-15: qty 2

## 2020-09-15 MED ORDER — VANCOMYCIN HCL 2000 MG/400ML IV SOLN
2000.0000 mg | Freq: Once | INTRAVENOUS | Status: AC
  Administered 2020-09-16: 2000 mg via INTRAVENOUS
  Filled 2020-09-15: qty 400

## 2020-09-15 MED ORDER — LACTATED RINGERS IV SOLN: INTRAVENOUS | Status: DC

## 2020-09-15 MED ORDER — ACETAMINOPHEN 500 MG PO TABS
1000.0000 mg | ORAL_TABLET | Freq: Once | ORAL | Status: AC
  Administered 2020-09-15: 1000 mg via ORAL
  Filled 2020-09-15: qty 2

## 2020-09-15 NOTE — ED Notes (Signed)
ED Provider at bedside.Pt returned from xray

## 2020-09-15 NOTE — Progress Notes (Signed)
CODE SEPSIS - PHARMACY COMMUNICATION  **Broad Spectrum Antibiotics should be administered within 1 hour of Sepsis diagnosis**  Time Code Sepsis Called/Page Received: 2315  Antibiotics Ordered: Aztreonam and Vancomycin  Time of 1st antibiotic administration: 0004  Renda Rolls, PharmD, MBA 09/15/2020 11:30 PM

## 2020-09-15 NOTE — ED Provider Notes (Signed)
[facility] Emergency Department Provider Note   ____________________________________________   Event Date/Time   First MD Initiated Contact with Patient 09/15/20 2310     (approximate)  I have reviewed the triage vital signs and the nursing notes.   HISTORY  Chief Complaint Nausea    HPI Katherine Estes is a 60 y.o. female who presents to the ED from home with a chief complaint of fever, nausea and generalized malaise.  Patient had right TKR 09/02/2020.  Has been taking Lovenox.  Symptoms began approximately 6 PM after taking her Lovenox shot.  She did have a follow-up by Dr. Mack Guise today with staple removal and told wound was healing well.  Endorses slight cough.  Denies chest pain, shortness of breath, abdominal pain, vomiting, diarrhea.  States urine looks dark.     Past Medical History:  Diagnosis Date   Allergy    Anemia    Arthritis    Asthma    Diabetes mellitus without complication ([place])    H/O nephrolithotomy with removal of calculi    Hypertension    Hypothyroidism    Osteoporosis    Thyroid disease     Patient Active Problem List   Diagnosis Date Noted   Type II diabetes mellitus with renal manifestations ([place]) 09/03/2020   Asthma    Acute renal failure superimposed on stage 2 chronic kidney disease (HCC)    Vertigo    Normocytic anemia    S/P TKR (total knee replacement) using cement, right 09/02/2020   S/P TKR (total knee replacement) using cement 09/02/2020   Nephrolithiasis 06/13/2019   Sepsis ([place]) 05/29/2017   Bacteremia 05/26/2017   Spongiotic dermatitis 08/06/2014   Nipple discharge 06/26/2014   Papule of skin 06/26/2014   Cervical high risk HPV (human papillomavirus) test positive 03/29/2013   Hypothyroid 03/27/2013   Hypertension 03/27/2013   Migraine headache 03/27/2013   Asthma, moderate persistent 03/27/2013   Allergic rhinitis 03/27/2013   Irregular menses 12/24/2010   Fibroids 12/24/2010    Past  Surgical History:  Procedure Laterality Date   BLADDER SURGERY     BLADDER LIFTED   BREAST BIOPSY Left    neg   BREAST SURGERY     LUMP EXCISION LEFT BREAST.   CESAREAN SECTION     COLONOSCOPY WITH PROPOFOL N/A 04/11/2020   Procedure: COLONOSCOPY WITH PROPOFOL;  Surgeon: Jonathon Bellows, MD;  Location: [facility] ENDOSCOPY;  Service: Endoscopy;  Laterality: N/A;   THYROIDECTOMY     TONSILLECTOMY     TOTAL KNEE ARTHROPLASTY Right 09/02/2020   Procedure: RIGHT TOTAL KNEE ARTHROPLASTY;  Surgeon: Thornton Park, MD;  Location: [facility] ORS;  Service: Orthopedics;  Laterality: Right;   WISDOM TOOTH EXTRACTION      Prior to Admission medications   Medication Sig Start Date End Date Taking? Authorizing Provider  acetaminophen (TYLENOL) 325 MG tablet Take 1-2 tablets (325-650 mg total) by mouth every 6 (six) hours as needed for mild pain (pain score 1-3 or temp > 100.5). 09/08/20 10/08/20  Thornton Park, MD  albuterol (VENTOLIN HFA) 108 (90 Base) MCG/ACT inhaler Inhale 1-2 puffs into the lungs every 6 (six) hours as needed for wheezing or shortness of breath.    [provider]  benzonatate (TESSALON) 200 MG capsule Take 200 mg by mouth 3 (three) times daily as needed for cough.    [provider]  bisacodyl (DULCOLAX) 5 MG EC tablet Take 2 tablets (10 mg total) by mouth daily as needed for moderate constipation. 09/08/20  Thornton Park, MD  cholecalciferol (VITAMIN D) 1000 units tablet Take 1,000 Units by mouth daily.    [provider]  dextromethorphan-guaiFENesin (MUCINEX DM) 30-600 MG 12hr tablet Take 1 tablet by mouth 2 (two) times daily as needed for up to 15 doses for cough. 09/08/20   Thornton Park, MD  docusate sodium (COLACE) 100 MG capsule Take 1 capsule (100 mg total) by mouth 2 (two) times daily. 09/08/20   Thornton Park, MD  enoxaparin (LOVENOX) 40 MG/0.4ML injection Inject 0.4 mLs (40 mg total) into the skin daily. 09/09/20 10/09/20  Thornton Park, MD   Fluticasone-Salmeterol (ADVAIR) 500-50 MCG/DOSE AEPB Inhale 1 puff into the lungs daily as needed (shortness of breath or wheezing).    [provider]  hydrochlorothiazide (HYDRODIURIL) 25 MG tablet Take 25 mg by mouth daily.    [provider]  levothyroxine (SYNTHROID) 150 MCG tablet Take 150 mcg by mouth daily before breakfast.    [provider]  losartan (COZAAR) 50 MG tablet Take 50 mg by mouth daily.    [provider]  metFORMIN (GLUCOPHAGE) 500 MG tablet Take 500 mg by mouth daily with breakfast.    [provider]  montelukast (SINGULAIR) 10 MG tablet Take 10 mg by mouth at bedtime.    [provider]  oxyCODONE (OXY IR/ROXICODONE) 5 MG immediate release tablet Take 1-2 tablets (5-10 mg total) by mouth every 4 (four) hours as needed for moderate pain (pain score 4-6). 09/08/20   Thornton Park, MD  pantoprazole (PROTONIX) 40 MG tablet Take 1 tablet (40 mg total) by mouth daily. 09/06/20 10/06/20  Elgergawy, Silver Huguenin, MD  potassium chloride (KLOR-CON) 10 MEQ tablet Take 10 mEq by mouth daily. 01/21/20   [provider]  sulfamethoxazole-trimethoprim (BACTRIM DS) 800-160 MG tablet Take 1 tablet by mouth 2 (two) times daily. 09/15/20   [provider]  triamcinolone cream (KENALOG) 0.1 % Apply 1 application topically 2 (two) times daily.    [provider]    Allergies Cinnamon and Penicillins  Family History  Problem Relation Age of Onset   Arthritis Mother    Asthma Mother    Hypertension Mother    Cancer Maternal Grandmother        THYRIOD   Breast cancer Neg Hx     Social History Social History   Tobacco Use   Smoking status: Former    Packs/day: 0.25    Years: 3.00    Pack years: 0.75    Types: Cigarettes    Quit date: 05/21/1991    Years since quitting: 29.3   Smokeless tobacco: Never   Tobacco comments:    she only smoked when she drank  Vaping Use   Vaping Use: Never used  Substance  Use Topics   Alcohol use: Yes    Alcohol/week: 0.0 standard drinks    Comment: socially    Drug use: No    Review of Systems  Constitutional: Positive for fever/chills Eyes: No visual changes. ENT: No sore throat. Cardiovascular: Denies chest pain. Respiratory: Positive for cough.  Denies shortness of breath. Gastrointestinal: No abdominal pain.  Positive for nausea, no vomiting.  No diarrhea.  No constipation. Genitourinary: Positive for dark urine.  Negative for dysuria. Musculoskeletal: Negative for back pain. Skin: Negative for rash. Neurological: Negative for headaches, focal weakness or numbness.   ____________________________________________   PHYSICAL EXAM:  VITAL SIGNS: ED Triage Vitals  Enc Vitals Group     BP 09/15/20 2304 (!) 89/58  Pulse Rate 09/15/20 2300 (!) 118     Resp 09/15/20 2300 20     Temp 09/15/20 2300 (!) 103 F (39.4 C)     Temp Source 09/15/20 2300 Oral     SpO2 09/15/20 2300 94 %     Weight 09/15/20 2301 230 lb (104.3 kg)     Height 09/15/20 2301 5\' 6"  (1.676 m)     Head Circumference --      Peak Flow --      Pain Score 09/15/20 2301 0     Pain Loc --      Pain Edu? --      Excl. in [place]? --     Constitutional: Alert and oriented.  Weak appearing and in mild acute distress. Eyes: Conjunctivae are normal. PERRL. EOMI. Head: Atraumatic. Nose: No congestion/rhinnorhea. Mouth/Throat: Mucous membranes are mildly dry. Neck: No stridor.  Supple neck without meningismus.   Cardiovascular: Tachycardic rate, regular rhythm. Grossly normal heart sounds.  Good peripheral circulation. Respiratory: Increased respiratory effort.  No retractions. Lungs slightly diminished bibasilarly. Gastrointestinal: Soft and nontender to light or deep palpation. No distention. No abdominal bruits. No CVA tenderness. Musculoskeletal: Right knee dressing removed to reveal moderate effusion.  Limited range of motion secondary to swelling.  No purulence expressed.   Slightly warm to the touch.  No erythema.  Palpable distal pulses.  Neurologic: Alert and oriented x3.  CN II to XII grossly intact. Normal speech and language. No gross focal neurologic deficits are appreciated.  Skin:  Skin is warm, dry and intact. No rash noted.  No petechiae. Psychiatric: Mood and affect are normal. Speech and behavior are normal.  ____________________________________________   LABS (all labs ordered are listed, but only abnormal results are displayed)  Labs Reviewed  CBC - Abnormal; Notable for the following components:      Result Value   WBC 19.2 (*)    RBC 3.74 (*)    Hemoglobin 10.8 (*)    HCT 33.7 (*)    Platelets 440 (*)    All other components within normal limits  COMPREHENSIVE METABOLIC PANEL - Abnormal; Notable for the following components:   Potassium 2.9 (*)    CO2 21 (*)    Glucose, Bld 134 (*)    BUN 30 (*)    Creatinine, Ser 1.45 (*)    Calcium 7.7 (*)    Albumin 2.9 (*)    GFR, Estimated 42 (*)    All other components within normal limits  LACTIC ACID, PLASMA - Abnormal; Notable for the following components:   Lactic Acid, Venous 2.4 (*)    All other components within normal limits  DIFFERENTIAL - Abnormal; Notable for the following components:   Neutro Abs 18.1 (*)    Lymphs Abs 0.5 (*)    Abs Immature Granulocytes 0.21 (*)    All other components within normal limits  RESP PANEL BY RT-PCR (FLU A&B, COVID) ARPGX2  CULTURE, BLOOD (ROUTINE X 2)  CULTURE, BLOOD (ROUTINE X 2)  URINE CULTURE  PROCALCITONIN  LIPASE, BLOOD  LACTIC ACID, PLASMA  URINALYSIS, COMPLETE (UACMP) WITH MICROSCOPIC  PROTIME-INR  CORTISOL-AM, BLOOD  PROCALCITONIN  BASIC METABOLIC PANEL  CBC  HIV ANTIBODY (ROUTINE TESTING W REFLEX)   ____________________________________________  EKG  ED ECG REPORT I, Rhyan Radler J, the attending physician, personally viewed and interpreted this ECG.   Date: 09/16/2020  EKG Time: 2313  Rate: 116  Rhythm: sinus  tachycardia  Axis: LAD  Intervals:left anterior fascicular block  ST&T Change:  Nonspecific  ____________________________________________  RADIOLOGY Cecilie Kicks J, personally viewed and evaluated these images (plain radiographs) as part of my medical decision making, as well as reviewing the written report by the radiologist.  ED MD interpretation: No acute cardiopulmonary process, moderate to large joint effusion right knee  Official radiology report(s): DG Chest 2 View  Result Date: 09/15/2020 CLINICAL DATA:  Generalized weakness. Recent right knee replacement. EXAM: CHEST - 2 VIEW COMPARISON:  September 04, 2020 FINDINGS: The heart size and mediastinal contours are within normal limits. Both lungs are clear. The visualized skeletal structures are unremarkable. IMPRESSION: No active cardiopulmonary disease. Electronically Signed   By: Virgina Norfolk M.D.   On: 09/15/2020 23:42   DG Knee Complete 4 Views Right  Result Date: 09/15/2020 CLINICAL DATA:  Recent right knee replacement with fever. EXAM: RIGHT KNEE - COMPLETE 4+ VIEW COMPARISON:  September 02, 2020 FINDINGS: A right knee replacement is seen without evidence of surrounding lucency to suggest the presence of hardware loosening or infection. No evidence of an acute fracture or dislocation. A moderate to large joint effusion is seen. IMPRESSION: 1. Intact right knee replacement. 2. Moderate to large joint effusion. Electronically Signed   By: Virgina Norfolk M.D.   On: 09/15/2020 23:43    ____________________________________________   PROCEDURES  Procedure(s) performed (including Critical Care):  .1-3 Lead EKG Interpretation  Date/Time: 09/16/2020 12:05 AM Performed by: Paulette Blanch, MD Authorized by: Paulette Blanch, MD     Interpretation: abnormal     ECG rate:  116   ECG rate assessment: tachycardic     Rhythm: sinus tachycardia     Ectopy: none     Conduction: normal   Comments:     Placed on cardiac monitor to evaluate for  arrhythmias   CRITICAL CARE Performed by: Paulette Blanch   Total critical care time: 45 minutes  Critical care time was exclusive of separately billable procedures and treating other patients.  Critical care was necessary to treat or prevent imminent or life-threatening deterioration.  Critical care was time spent personally by me on the following activities: development of treatment plan with patient and/or surrogate as well as nursing, discussions with consultants, evaluation of patient's response to treatment, examination of patient, obtaining history from patient or surrogate, ordering and performing treatments and interventions, ordering and review of laboratory studies, ordering and review of radiographic studies, pulse oximetry and re-evaluation of patient's condition.  ____________________________________________   INITIAL IMPRESSION / ASSESSMENT AND PLAN / ED COURSE  As part of my medical decision making, I reviewed the following data within the [place] notes reviewed and incorporated, Labs reviewed, EKG interpreted, Old chart reviewed, Radiograph reviewed, Discussed with admitting physician, and Notes from prior ED visits     60 year old female presenting with fever, generalized malaise and nausea; recent right TKR.  Differential diagnosis includes but is not limited to sepsis, CAP, UTI, septic joint, viral process such as COVID-19, etc.  Code sepsis initiated based on patient's vital signs.  30 cc/kilo lactated Ringer's ordered; broad-spectrum IV antibiotics will be infused.  Will obtain knee x-rays along with chest x-ray, sepsis order set, UA.  Anticipate hospitalization.   Clinical Course as of 09/16/20 0214  Tue Sep 16, 2020  0045 Noted elevated lactic acid.  Awaiting UA.  Will discuss with hospitalist services for admission. [JS]    Clinical Course User Index [JS] Paulette Blanch, MD     ____________________________________________   FINAL  CLINICAL IMPRESSION(S) /  ED DIAGNOSES  Final diagnoses:  Sepsis, due to unspecified organism, unspecified whether acute organ dysfunction present ([place])  AKI (acute kidney injury) ([place])  Hypokalemia  Effusion of right knee     ED Discharge Orders     None        Note:  This document was prepared using Dragon voice recognition software and may include unintentional dictation errors.    Paulette Blanch, MD 09/16/20 [phone]

## 2020-09-15 NOTE — Progress Notes (Signed)
PHARMACY -  BRIEF ANTIBIOTIC NOTE   Pharmacy has received consult(s) for Aztreonam and Vancomcyin from an ED provider.  The patient's profile has been reviewed for ht/wt/allergies/indication/available labs.    One time order(s) placed for Aztreonam 2 gm and Vancomycin 2 gm per pt wt 104.3 kg.  Further antibiotics/pharmacy consults should be ordered by admitting physician if indicated.                       Renda Rolls, PharmD, [facility] 09/15/2020 11:31 PM

## 2020-09-15 NOTE — ED Triage Notes (Signed)
Pt states around 1800 after taking lovenox started feeling nauseous. Pt states she is taking it due to TKR 6/14, pt also co general malaise. Pt did have follow up today by ortho and had staples removed, was told incision looked normal.

## 2020-09-15 NOTE — ED Triage Notes (Signed)
EMS brings pt in from home for c/o generalized weakness, nausea; currently on lovenox post TKR

## 2020-09-15 NOTE — Sepsis Progress Note (Signed)
Monitoring for code sepsis protocol. 

## 2020-09-16 DIAGNOSIS — J45909 Unspecified asthma, uncomplicated: Secondary | ICD-10-CM | POA: Diagnosis present

## 2020-09-16 DIAGNOSIS — E1169 Type 2 diabetes mellitus with other specified complication: Secondary | ICD-10-CM | POA: Diagnosis not present

## 2020-09-16 DIAGNOSIS — Z8249 Family history of ischemic heart disease and other diseases of the circulatory system: Secondary | ICD-10-CM | POA: Diagnosis not present

## 2020-09-16 DIAGNOSIS — E890 Postprocedural hypothyroidism: Secondary | ICD-10-CM | POA: Diagnosis present

## 2020-09-16 DIAGNOSIS — M25561 Pain in right knee: Secondary | ICD-10-CM | POA: Diagnosis not present

## 2020-09-16 DIAGNOSIS — Z79890 Hormone replacement therapy (postmenopausal): Secondary | ICD-10-CM | POA: Diagnosis not present

## 2020-09-16 DIAGNOSIS — M25461 Effusion, right knee: Secondary | ICD-10-CM

## 2020-09-16 DIAGNOSIS — Z7984 Long term (current) use of oral hypoglycemic drugs: Secondary | ICD-10-CM | POA: Diagnosis not present

## 2020-09-16 DIAGNOSIS — E876 Hypokalemia: Secondary | ICD-10-CM

## 2020-09-16 DIAGNOSIS — Z6841 Body Mass Index (BMI) 40.0 and over, adult: Secondary | ICD-10-CM | POA: Diagnosis not present

## 2020-09-16 DIAGNOSIS — N179 Acute kidney failure, unspecified: Secondary | ICD-10-CM | POA: Diagnosis present

## 2020-09-16 DIAGNOSIS — I10 Essential (primary) hypertension: Secondary | ICD-10-CM | POA: Diagnosis present

## 2020-09-16 DIAGNOSIS — Z96651 Presence of right artificial knee joint: Secondary | ICD-10-CM | POA: Diagnosis present

## 2020-09-16 DIAGNOSIS — D649 Anemia, unspecified: Secondary | ICD-10-CM | POA: Diagnosis present

## 2020-09-16 DIAGNOSIS — E119 Type 2 diabetes mellitus without complications: Secondary | ICD-10-CM | POA: Diagnosis present

## 2020-09-16 DIAGNOSIS — Z87891 Personal history of nicotine dependence: Secondary | ICD-10-CM | POA: Diagnosis not present

## 2020-09-16 DIAGNOSIS — D75839 Thrombocytosis, unspecified: Secondary | ICD-10-CM | POA: Diagnosis present

## 2020-09-16 DIAGNOSIS — M810 Age-related osteoporosis without current pathological fracture: Secondary | ICD-10-CM | POA: Diagnosis present

## 2020-09-16 DIAGNOSIS — Z880 Allergy status to penicillin: Secondary | ICD-10-CM | POA: Diagnosis not present

## 2020-09-16 DIAGNOSIS — Z91018 Allergy to other foods: Secondary | ICD-10-CM | POA: Diagnosis not present

## 2020-09-16 DIAGNOSIS — M7981 Nontraumatic hematoma of soft tissue: Secondary | ICD-10-CM | POA: Diagnosis present

## 2020-09-16 DIAGNOSIS — Z79899 Other long term (current) drug therapy: Secondary | ICD-10-CM | POA: Diagnosis not present

## 2020-09-16 DIAGNOSIS — N12 Tubulo-interstitial nephritis, not specified as acute or chronic: Secondary | ICD-10-CM | POA: Diagnosis not present

## 2020-09-16 DIAGNOSIS — A419 Sepsis, unspecified organism: Secondary | ICD-10-CM | POA: Diagnosis present

## 2020-09-16 DIAGNOSIS — Z20822 Contact with and (suspected) exposure to covid-19: Secondary | ICD-10-CM | POA: Diagnosis present

## 2020-09-16 LAB — BASIC METABOLIC PANEL
Anion gap: 9 (ref 5–15)
BUN: 34 mg/dL — ABNORMAL HIGH (ref 6–20)
CO2: 25 mmol/L (ref 22–32)
Calcium: 8.2 mg/dL — ABNORMAL LOW (ref 8.9–10.3)
Chloride: 102 mmol/L (ref 98–111)
Creatinine, Ser: 1.99 mg/dL — ABNORMAL HIGH (ref 0.44–1.00)
GFR, Estimated: 28 mL/min — ABNORMAL LOW (ref >60)
Glucose, Bld: 122 mg/dL — ABNORMAL HIGH (ref 70–99)
Potassium: 4.3 mmol/L (ref 3.5–5.1)
Sodium: 136 mmol/L (ref 135–145)

## 2020-09-16 LAB — COMPREHENSIVE METABOLIC PANEL
ALT: 20 U/L (ref 0–44)
AST: 29 U/L (ref 15–41)
Albumin: 2.9 g/dL — ABNORMAL LOW (ref 3.5–5.0)
Alkaline Phosphatase: 88 U/L (ref 38–126)
Anion gap: 9 (ref 5–15)
BUN: 30 mg/dL — ABNORMAL HIGH (ref 6–20)
CO2: 21 mmol/L — ABNORMAL LOW (ref 22–32)
Calcium: 7.7 mg/dL — ABNORMAL LOW (ref 8.9–10.3)
Chloride: 106 mmol/L (ref 98–111)
Creatinine, Ser: 1.45 mg/dL — ABNORMAL HIGH (ref 0.44–1.00)
GFR, Estimated: 42 mL/min — ABNORMAL LOW (ref >60)
Glucose, Bld: 134 mg/dL — ABNORMAL HIGH (ref 70–99)
Potassium: 2.9 mmol/L — ABNORMAL LOW (ref 3.5–5.1)
Sodium: 136 mmol/L (ref 135–145)
Total Bilirubin: 0.7 mg/dL (ref 0.3–1.2)
Total Protein: 7.6 g/dL (ref 6.5–8.1)

## 2020-09-16 LAB — LACTIC ACID, PLASMA
Lactic Acid, Venous: 2.7 mmol/L (ref 0.5–1.9)
Lactic Acid, Venous: 3.7 mmol/L (ref 0.5–1.9)

## 2020-09-16 LAB — CBC
HCT: 29.5 % — ABNORMAL LOW (ref 36.0–46.0)
Hemoglobin: 9.5 g/dL — ABNORMAL LOW (ref 12.0–15.0)
MCH: 29.2 pg (ref 26.0–34.0)
MCHC: 32.2 g/dL (ref 30.0–36.0)
MCV: 90.8 fL (ref 80.0–100.0)
Platelets: 447 10*3/uL — ABNORMAL HIGH (ref 150–400)
RBC: 3.25 MIL/uL — ABNORMAL LOW (ref 3.87–5.11)
RDW: 15.1 % (ref 11.5–15.5)
WBC: 22.2 10*3/uL — ABNORMAL HIGH (ref 4.0–10.5)
nRBC: 0 % (ref 0.0–0.2)

## 2020-09-16 LAB — URINALYSIS, COMPLETE (UACMP) WITH MICROSCOPIC
Bilirubin Urine: NEGATIVE
Glucose, UA: NEGATIVE mg/dL
Ketones, ur: NEGATIVE mg/dL
Leukocytes,Ua: NEGATIVE
Nitrite: NEGATIVE
Protein, ur: NEGATIVE mg/dL
Specific Gravity, Urine: 1.011 (ref 1.005–1.030)
pH: 5 (ref 5.0–8.0)

## 2020-09-16 LAB — RESP PANEL BY RT-PCR (FLU A&B, COVID) ARPGX2
Influenza A by PCR: NEGATIVE
Influenza B by PCR: NEGATIVE
SARS Coronavirus 2 by RT PCR: NEGATIVE

## 2020-09-16 LAB — CBG MONITORING, ED
Glucose-Capillary: 79 mg/dL (ref 70–99)
Glucose-Capillary: 92 mg/dL (ref 70–99)
Glucose-Capillary: 85 mg/dL (ref 70–99)

## 2020-09-16 LAB — PROCALCITONIN
Procalcitonin: 6.94 ng/mL
Procalcitonin: 29.99 ng/mL

## 2020-09-16 LAB — CORTISOL-AM, BLOOD: Cortisol - AM: 29.5 ug/dL — ABNORMAL HIGH (ref 6.7–22.6)

## 2020-09-16 LAB — PROTIME-INR
INR: 1.1 (ref 0.8–1.2)
Prothrombin Time: 14.2 seconds (ref 11.4–15.2)

## 2020-09-16 LAB — HIV ANTIBODY (ROUTINE TESTING W REFLEX): HIV Screen 4th Generation wRfx: NONREACTIVE

## 2020-09-16 MED ORDER — VANCOMYCIN HCL 1500 MG/300ML IV SOLN
1500.0000 mg | INTRAVENOUS | Status: DC
  Filled 2020-09-16: qty 300

## 2020-09-16 MED ORDER — ONDANSETRON HCL 4 MG/2ML IJ SOLN: 4.0000 mg | Freq: Four times a day (QID) | INTRAMUSCULAR | Status: DC | PRN

## 2020-09-16 MED ORDER — ONDANSETRON HCL 4 MG PO TABS
4.0000 mg | ORAL_TABLET | Freq: Four times a day (QID) | ORAL | Status: DC | PRN
  Administered 2020-09-17: 4 mg via ORAL
  Filled 2020-09-16: qty 1

## 2020-09-16 MED ORDER — ALBUTEROL SULFATE (2.5 MG/3ML) 0.083% IN NEBU
2.5000 mg | INHALATION_SOLUTION | Freq: Four times a day (QID) | RESPIRATORY_TRACT | Status: DC | PRN
  Administered 2020-09-16: 2.5 mg via RESPIRATORY_TRACT
  Filled 2020-09-16: qty 3

## 2020-09-16 MED ORDER — POTASSIUM CHLORIDE CRYS ER 10 MEQ PO TBCR
10.0000 meq | EXTENDED_RELEASE_TABLET | Freq: Every day | ORAL | Status: DC
  Administered 2020-09-16 – 2020-09-19 (×4): 10 meq via ORAL
  Filled 2020-09-16 (×5): qty 1

## 2020-09-16 MED ORDER — LACTATED RINGERS IV SOLN: INTRAVENOUS | Status: DC

## 2020-09-16 MED ORDER — POTASSIUM CHLORIDE 2 MEQ/ML IV SOLN
INTRAVENOUS | Status: DC
  Filled 2020-09-16 (×4): qty 1000

## 2020-09-16 MED ORDER — LEVOTHYROXINE SODIUM 50 MCG PO TABS
150.0000 ug | ORAL_TABLET | Freq: Every day | ORAL | Status: DC
  Administered 2020-09-17 – 2020-09-19 (×3): 150 ug via ORAL
  Filled 2020-09-16 (×3): qty 1

## 2020-09-16 MED ORDER — METRONIDAZOLE 500 MG/100ML IV SOLN
500.0000 mg | Freq: Three times a day (TID) | INTRAVENOUS | Status: DC
  Administered 2020-09-16 – 2020-09-17 (×3): 500 mg via INTRAVENOUS
  Filled 2020-09-16 (×8): qty 100

## 2020-09-16 MED ORDER — MOMETASONE FURO-FORMOTEROL FUM 200-5 MCG/ACT IN AERO: 2.0000 | INHALATION_SPRAY | Freq: Two times a day (BID) | RESPIRATORY_TRACT | Status: DC

## 2020-09-16 MED ORDER — SODIUM CHLORIDE 0.9 % IV SOLN
2.0000 g | Freq: Three times a day (TID) | INTRAVENOUS | Status: AC
  Administered 2020-09-16 – 2020-09-17 (×5): 2 g via INTRAVENOUS
  Filled 2020-09-16 (×5): qty 2

## 2020-09-16 MED ORDER — MAGNESIUM HYDROXIDE 400 MG/5ML PO SUSP: 30.0000 mL | Freq: Every day | ORAL | Status: DC | PRN

## 2020-09-16 MED ORDER — DM-GUAIFENESIN ER 30-600 MG PO TB12
1.0000 | ORAL_TABLET | Freq: Two times a day (BID) | ORAL | Status: DC | PRN
  Administered 2020-09-16: 1 via ORAL
  Filled 2020-09-16: qty 1

## 2020-09-16 MED ORDER — MOMETASONE FURO-FORMOTEROL FUM 200-5 MCG/ACT IN AERO
2.0000 | INHALATION_SPRAY | Freq: Two times a day (BID) | RESPIRATORY_TRACT | Status: DC
  Administered 2020-09-16 – 2020-09-19 (×4): 2 via RESPIRATORY_TRACT
  Filled 2020-09-16: qty 8.8

## 2020-09-16 MED ORDER — INSULIN ASPART 100 UNIT/ML IJ SOLN
0.0000 [IU] | Freq: Three times a day (TID) | INTRAMUSCULAR | Status: DC
  Administered 2020-09-17 – 2020-09-18 (×2): 1 [IU] via SUBCUTANEOUS
  Filled 2020-09-16 (×2): qty 1

## 2020-09-16 MED ORDER — TRAZODONE HCL 50 MG PO TABS
25.0000 mg | ORAL_TABLET | Freq: Every evening | ORAL | Status: DC | PRN
  Administered 2020-09-16: 25 mg via ORAL
  Filled 2020-09-16: qty 1

## 2020-09-16 MED ORDER — MONTELUKAST SODIUM 10 MG PO TABS
10.0000 mg | ORAL_TABLET | Freq: Every day | ORAL | Status: DC
  Administered 2020-09-16 – 2020-09-18 (×3): 10 mg via ORAL
  Filled 2020-09-16 (×3): qty 1

## 2020-09-16 MED ORDER — ALBUTEROL SULFATE HFA 108 (90 BASE) MCG/ACT IN AERS: 1.0000 | INHALATION_SPRAY | Freq: Four times a day (QID) | RESPIRATORY_TRACT | Status: DC | PRN

## 2020-09-16 MED ORDER — VANCOMYCIN HCL IN DEXTROSE 1-5 GM/200ML-% IV SOLN: 1000.0000 mg | Freq: Once | INTRAVENOUS | Status: DC

## 2020-09-16 MED ORDER — AZTREONAM 2 G IJ SOLR
2.0000 g | Freq: Three times a day (TID) | INTRAMUSCULAR | Status: DC
  Filled 2020-09-16: qty 2

## 2020-09-16 MED ORDER — ACETAMINOPHEN 650 MG RE SUPP: 650.0000 mg | Freq: Four times a day (QID) | RECTAL | Status: DC | PRN

## 2020-09-16 MED ORDER — ENOXAPARIN SODIUM 60 MG/0.6ML IJ SOSY
0.5000 mg/kg | PREFILLED_SYRINGE | INTRAMUSCULAR | Status: DC
  Administered 2020-09-16 – 2020-09-19 (×4): 52.5 mg via SUBCUTANEOUS
  Filled 2020-09-16 (×3): qty 0.53
  Filled 2020-09-16: qty 0.6

## 2020-09-16 MED ORDER — LACTATED RINGERS IV BOLUS
1000.0000 mL | Freq: Once | INTRAVENOUS | Status: AC
  Administered 2020-09-16: 1000 mL via INTRAVENOUS

## 2020-09-16 MED ORDER — VANCOMYCIN HCL IN DEXTROSE 1-5 GM/200ML-% IV SOLN: 1000.0000 mg | INTRAVENOUS | Status: DC

## 2020-09-16 MED ORDER — VITAMIN D 25 MCG (1000 UNIT) PO TABS
1000.0000 [IU] | ORAL_TABLET | Freq: Every day | ORAL | Status: DC
  Administered 2020-09-16 – 2020-09-19 (×4): 1000 [IU] via ORAL
  Filled 2020-09-16 (×4): qty 1

## 2020-09-16 MED ORDER — BENZONATATE 100 MG PO CAPS
200.0000 mg | ORAL_CAPSULE | Freq: Three times a day (TID) | ORAL | Status: DC | PRN
  Administered 2020-09-16: 200 mg via ORAL
  Filled 2020-09-16: qty 2

## 2020-09-16 MED ORDER — POTASSIUM CHLORIDE 10 MEQ/100ML IV SOLN
10.0000 meq | Freq: Once | INTRAVENOUS | Status: AC
  Administered 2020-09-16: 10 meq via INTRAVENOUS
  Filled 2020-09-16: qty 100

## 2020-09-16 MED ORDER — ACETAMINOPHEN 325 MG PO TABS
650.0000 mg | ORAL_TABLET | Freq: Four times a day (QID) | ORAL | Status: DC | PRN
Start: 2020-09-16 — End: 2020-09-19
  Administered 2020-09-16 – 2020-09-17 (×2): 650 mg via ORAL
  Filled 2020-09-16 (×2): qty 2

## 2020-09-16 MED ORDER — POTASSIUM CHLORIDE 10 MEQ/100ML IV SOLN
10.0000 meq | Freq: Once | INTRAVENOUS | Status: AC
  Administered 2020-09-16: 10 meq via INTRAVENOUS

## 2020-09-16 NOTE — Progress Notes (Signed)
Pharmacy Antibiotic Note  Katherine Estes is a 60 y.o. female admitted on 09/15/2020 with sepsis from unknown source, potentially from leg wound.  Pharmacy has been consulted for Vancomycin dosing. Febrile (Tmax 103) WBC 19.2>22.2 PCT 6.94>29.99 Lactic acid 2.4>2.7  Plan: Pt given Vancomycin 2 gm once in ED @ 0103 on 09/16/2020. Pt was on vancomycin 1000 mg Q 24 hrs following the loading dose, but due to an increase in SCr (1.45>1.99), the dose was changed to vancomycin 1500 mg Q 48 hrs.  Goal AUC 400-550. Expected AUC: 512.7 SCr used: 1.99, BMI 37.1, Vd used: 0.5   Continue Aztreonam 2 g Q 8 hrs. Recommend to switch to cefepime 2 g Q 12 hrs as patient has tolerated Ancef and ceftriaxone in the past.   Continue Flagyl 500 mg Q 8 hrs.  Pharmacy will continue to follow and will adjust abx dosing whenever warranted.  Height: 5\' 6"  (167.6 cm) Weight: 104.3 kg (230 lb) IBW/kg (Calculated) : 59.3  Temp (24hrs), Avg:100.5 F (38.1 C), Min:99.1 F (37.3 C), Max:103 F (39.4 C)  Recent Labs  Lab 09/15/20 2304 09/15/20 2305 09/16/20 0700  WBC  --  19.2* 22.2*  CREATININE  --  1.45* 1.99*  LATICACIDVEN 2.4*  --  2.7*     Estimated Creatinine Clearance: 37.1 mL/min (A) (by C-G formula based on SCr of 1.99 mg/dL (H)).    Allergies  Allergen Reactions   Cinnamon     "not good"   Penicillins Hives and Rash    Received ceftriaxone in 2019    Antimicrobials this admission: 6/27 Vancomycin >>  6/27 Flagyl >>  6/27 Aztreonam >>    Microbiology results: 06/27 BCx: NG <12h 06/27 UCx: Pending   Thank you for allowing pharmacy to be a part of this patient's care.  Geraldo Docker PharmD Candidate at [facility]  09/16/2020 1:58 PM

## 2020-09-16 NOTE — ED Notes (Signed)
Patient is resting comfortably. 

## 2020-09-16 NOTE — ED Notes (Signed)
Pt given warm blanket, purwick in place. Call bell within reach

## 2020-09-16 NOTE — Sepsis Progress Note (Signed)
Notified bedside nurse of need to draw repeat lactic acid. 

## 2020-09-16 NOTE — ED Notes (Addendum)
Patient expressing concern about swelling to RLE. Patient reports swelling is new. Sock removed, patient is noted to have swelling to the calf on the right leg, pitting 1+. Patient is also noted to be hot to the touch on the RLE, compared to the LLE. Pulses noted to BLE. Dr. Cindi Carbon (ortho consulting on patient) notified of new swelling and heat to RLE. Awaiting response.

## 2020-09-16 NOTE — ED Notes (Signed)
Patient reporting some shortness of breath and headache at this time. Patient denies chest pain. Patient oxygen sat noted to be 97% on RA. Patient respirations even and unlabored. NADN. Patient reassured that her vitals were WNL. Patient repositioned in bed, to allow for increased air exchange. Patient reports slight improvement. Patient to be medicated with PRN medications (see MAR).

## 2020-09-16 NOTE — Progress Notes (Signed)
Pharmacy Antibiotic Note  Katherine Estes is a 60 y.o. female admitted on 09/15/2020 with sepsis from unknown source.  Pharmacy has been consulted for Vancomycin dosing.  Plan: Pt given Vancomycin 2 gm once in ED.  Vancomycin 1000 mg IV Q 24 hrs.  Goal AUC 400-550. Expected AUC: 520.2 SCr used: 1.45, BMI 37.1, Vd used: 0.5   Pharmacy will continue to follow and will adjust abx dosing whenever warranted.  Height: 5\' 6"  (167.6 cm) Weight: 104.3 kg (230 lb) IBW/kg (Calculated) : 59.3  Temp (24hrs), Avg:101.8 F (38.8 C), Min:100.5 F (38.1 C), Max:103 F (39.4 C)  Recent Labs  Lab 09/15/20 2304 09/15/20 2305  WBC  --  19.2*  CREATININE  --  1.45*  LATICACIDVEN 2.4*  --     Estimated Creatinine Clearance: 51 mL/min (A) (by C-G formula based on SCr of 1.45 mg/dL (H)).    Allergies  Allergen Reactions   Cinnamon     "not good"   Penicillins Hives and Rash    Antimicrobials this admission: 6/27 Aztreonam >>  6/27 Vancomycin >>  6/27 Flagyl >>  Microbiology results: 06/27 BCx: Pending 06/27 UCx: Pending   Thank you for allowing pharmacy to be a part of this patient's care.  Renda Rolls, PharmD, [facility] 09/16/2020 1:35 AM

## 2020-09-16 NOTE — Sepsis Progress Note (Signed)
Repeat (second) lactic showed as drawn in Epic at 0300.  By end of code sepsis monitoring, still not resulted.  Messaged bedside RN via secure chat to determine delay in result.  Per RN, lab at bedside now redrawing repeat lactic.  Unknown why 0300 lactic was not resulted per RN.  Monitoring complete.

## 2020-09-16 NOTE — ED Notes (Signed)
Family updated as to patient's status with patient permission.

## 2020-09-16 NOTE — Progress Notes (Signed)
PHARMACIST - PHYSICIAN COMMUNICATION  CONCERNING:  Enoxaparin (Lovenox) for DVT Prophylaxis    RECOMMENDATION: Patient was prescribed enoxaprin 40mg  q24 hours for VTE prophylaxis.   Filed Weights   09/15/20 2301  Weight: 104.3 kg (230 lb)    Body mass index is 37.12 kg/m.  Estimated Creatinine Clearance: 51 mL/min (A) (by C-G formula based on SCr of 1.45 mg/dL (H)).   Based on [place] patient is candidate for enoxaparin 0.5mg /kg TBW SQ every 24 hours based on BMI being >30.  DESCRIPTION: Pharmacy has adjusted enoxaparin dose per [facility] policy.  Patient is now receiving enoxaparin 0.5 mg/kg every 24 hours    Renda Rolls, PharmD, [facility] 09/16/2020 1:38 AM

## 2020-09-16 NOTE — ED Notes (Signed)
New purewick placed on this pt with no issue.

## 2020-09-16 NOTE — Progress Notes (Signed)
Patient ID: [name], female   DOB: 03/17/1961, 60 y.o.   MRN: [id number] This is a no charge note as patient was admitted this AM.  H&P reviewed.  Patient seen and examined.   [name] is a 60 y.o. African-American female with medical history significant for hypertension, hypothyroidism, diabetes mellitus and asthma, who presented to the ER with acute onset of fever with chills and worsening right knee swelling with associated tenderness and mild redness and pain.  She develops a dry cough without wheezing or dyspnea.  Was given IV fluids, IV antibiotics.  Orthopedics did see the patient and did not think her leg was infected they felt it was a hematoma.  Blood cultures and urine cultures pending. Covid negative  EOMI Nad Cta  Regular s1/s2  Soft +bs RLE +swelling, minimal discharge from surgical site. Warm to touch  A/P: Continue iv abx F/u cultures Trend procalcitonin

## 2020-09-16 NOTE — ED Notes (Signed)
Patient resting comfortably. Patient updated on POC and pending lactic acid results. Patient verbalized understanding. Patient repositioned in the bed for sleep. Lights dimmed for patient comfort. Call light within reach to the left of the patient, stretcher in low and locked position.

## 2020-09-16 NOTE — H&P (Addendum)
[place]   PATIENT NAME: Katherine Estes    MR#:  [id number]  DATE OF BIRTH:  Sep 10, 1960  DATE OF ADMISSION:  09/15/2020  PRIMARY CARE PHYSICIAN: [facility] [place]   Patient is coming from: Home  REQUESTING/REFERRING PHYSICIAN: Lurline Hare, MD  CHIEF COMPLAINT:   Chief Complaint  Patient presents with  . Nausea    HISTORY OF PRESENT ILLNESS:  Katherine Estes is a 60 y.o. African-American female with medical history significant for hypertension, hypothyroidism, diabetes mellitus and asthma, who presented to the ER with acute onset of fever with chills and worsening right knee swelling with associated tenderness and mild redness and pain.  She develops a dry cough without wheezing or dyspnea.  No nausea or vomiting or diarrhea or abdominal pain.  No chest pain or palpitations.  No dysuria, oliguria or hematuria or flank pain.  No rhinorrhea or nasal congestion or sore throat or earache.  ED Course: Upon presentation to the ER vital signs are within normal and later heart rate was at 114 and respiratory rate 22.  Labs revealed hypokalemia of 2.9 and a BUN of 30 with creatinine 1.45 previously normal and calcium 7.7.  Albumin was 2.9.  Lactic acid was 2.4 and the rest of 6.94 CBC showed leukocytosis 19.2 with neutrophilia and anemia with thrombocytosis.  UA came back with rare bacteria and only 0-5 WBCs.  Once antigens and COVID-19 PCR came back negative.  Urine and blood cultures were sent.    Imaging: Two-view chest x-ray showed no acute cardiopulmonary disease.  Right knee x-ray showed intact right knee replacement and moderate to large joint effusion.  The patient was given 30 mill per kilogram of IV lactated Ringer bolus followed by infusion, 2 g of IV aztreonam, IV Flagyl and vancomycin, 4 mg of IV Zofran and 21: IV potassium chloride.  She will be admitted to a progressive unit bed for further evaluation and management. PAST MEDICAL HISTORY:   Past Medical  History:  Diagnosis Date  . Allergy   . Anemia   . Arthritis   . Asthma   . Diabetes mellitus without complication ([place])   . H/O nephrolithotomy with removal of calculi   . Hypertension   . Hypothyroidism   . Osteoporosis   . Thyroid disease     PAST SURGICAL HISTORY:   Past Surgical History:  Procedure Laterality Date  . BLADDER SURGERY     BLADDER LIFTED  . BREAST BIOPSY Left    neg  . BREAST SURGERY     LUMP EXCISION LEFT BREAST.  Marland Kitchen CESAREAN SECTION    . COLONOSCOPY WITH PROPOFOL N/A 04/11/2020   Procedure: COLONOSCOPY WITH PROPOFOL;  Surgeon: Jonathon Bellows, MD;  Location: [facility] ENDOSCOPY;  Service: Endoscopy;  Laterality: N/A;  . THYROIDECTOMY    . TONSILLECTOMY    . TOTAL KNEE ARTHROPLASTY Right 09/02/2020   Procedure: RIGHT TOTAL KNEE ARTHROPLASTY;  Surgeon: Thornton Park, MD;  Location: [facility] ORS;  Service: Orthopedics;  Laterality: Right;  . WISDOM TOOTH EXTRACTION      SOCIAL HISTORY:   Social History   Tobacco Use  . Smoking status: Former    Packs/day: 0.25    Years: 3.00    Pack years: 0.75    Types: Cigarettes    Quit date: 05/21/1991    Years since quitting: 29.3  . Smokeless tobacco: Never  . Tobacco comments:    she only smoked when she drank  Substance Use Topics  .  Alcohol use: Yes    Alcohol/week: 0.0 standard drinks    Comment: socially     FAMILY HISTORY:   Family History  Problem Relation Age of Onset  . Arthritis Mother   . Asthma Mother   . Hypertension Mother   . Cancer Maternal Grandmother        THYRIOD  . Breast cancer Neg Hx     DRUG ALLERGIES:   Allergies  Allergen Reactions  . Cinnamon     "not good"  . Penicillins Hives and Rash    REVIEW OF SYSTEMS:   ROS As per history of present illness. All pertinent systems were reviewed above. Constitutional, HEENT, cardiovascular, respiratory, GI, GU, musculoskeletal, neuro, psychiatric, endocrine, integumentary and hematologic systems were reviewed and are otherwise  negative/unremarkable except for positive findings mentioned above in the HPI.   MEDICATIONS AT HOME:   Prior to Admission medications   Medication Sig Start Date End Date Taking? Authorizing Provider  acetaminophen (TYLENOL) 325 MG tablet Take 1-2 tablets (325-650 mg total) by mouth every 6 (six) hours as needed for mild pain (pain score 1-3 or temp > 100.5). 09/08/20 10/08/20  Thornton Park, MD  albuterol (VENTOLIN HFA) 108 (90 Base) MCG/ACT inhaler Inhale 1-2 puffs into the lungs every 6 (six) hours as needed for wheezing or shortness of breath.    [provider]  benzonatate (TESSALON) 200 MG capsule Take 200 mg by mouth 3 (three) times daily as needed for cough.    [provider]  bisacodyl (DULCOLAX) 5 MG EC tablet Take 2 tablets (10 mg total) by mouth daily as needed for moderate constipation. 09/08/20   Thornton Park, MD  cholecalciferol (VITAMIN D) 1000 units tablet Take 1,000 Units by mouth daily.    [provider]  dextromethorphan-guaiFENesin (MUCINEX DM) 30-600 MG 12hr tablet Take 1 tablet by mouth 2 (two) times daily as needed for up to 15 doses for cough. 09/08/20   Thornton Park, MD  docusate sodium (COLACE) 100 MG capsule Take 1 capsule (100 mg total) by mouth 2 (two) times daily. 09/08/20   Thornton Park, MD  enoxaparin (LOVENOX) 40 MG/0.4ML injection Inject 0.4 mLs (40 mg total) into the skin daily. 09/09/20 10/09/20  Thornton Park, MD  Fluticasone-Salmeterol (ADVAIR) 500-50 MCG/DOSE AEPB Inhale 1 puff into the lungs daily as needed (shortness of breath or wheezing).    [provider]  hydrochlorothiazide (HYDRODIURIL) 25 MG tablet Take 25 mg by mouth daily.    [provider]  levothyroxine (SYNTHROID) 150 MCG tablet Take 150 mcg by mouth daily before breakfast.    [provider]  losartan (COZAAR) 50 MG tablet Take 50 mg by mouth daily.    [provider]  metFORMIN (GLUCOPHAGE) 500 MG tablet Take 500  mg by mouth daily with breakfast.    [provider]  montelukast (SINGULAIR) 10 MG tablet Take 10 mg by mouth at bedtime.    [provider]  oxyCODONE (OXY IR/ROXICODONE) 5 MG immediate release tablet Take 1-2 tablets (5-10 mg total) by mouth every 4 (four) hours as needed for moderate pain (pain score 4-6). 09/08/20   Thornton Park, MD  pantoprazole (PROTONIX) 40 MG tablet Take 1 tablet (40 mg total) by mouth daily. 09/06/20 10/06/20  Elgergawy, Silver Huguenin, MD  potassium chloride (KLOR-CON) 10 MEQ tablet Take 10 mEq by mouth daily. 01/21/20   [provider]  sulfamethoxazole-trimethoprim (BACTRIM DS) 800-160 MG tablet Take 1 tablet by mouth 2 (two) times daily. 09/15/20  [provider]  triamcinolone cream (KENALOG) 0.1 % Apply 1 application topically 2 (two) times daily.    [provider]      VITAL SIGNS:  Blood pressure (!) 128/57, pulse (!) 103, temperature (!) 100.5 F (38.1 C), temperature source Oral, resp. rate (!) 23, height 5\' 6"  (1.676 m), weight 104.3 kg, last menstrual period 10/03/2015, SpO2 95 %.  PHYSICAL EXAMINATION:  Physical Exam  GENERAL:  60 y.o.-year-old African-American female patient lying in the bed with no acute distress.  EYES: Pupils equal, round, reactive to light and accommodation. No scleral icterus. Extraocular muscles intact.  HEENT: Head atraumatic, normocephalic. Oropharynx and nasopharynx clear.  NECK:  Supple, no jugular venous distention. No thyroid enlargement, no tenderness.  LUNGS: Normal breath sounds bilaterally, no wheezing, rales,rhonchi or crepitation. No use of accessory muscles of respiration.  CARDIOVASCULAR: Regular rate and rhythm, S1, S2 normal. No murmurs, rubs, or gallops.  ABDOMEN: Soft, nondistended, nontender. Bowel sounds present. No organomegaly or mass.  EXTREMITIES: No pedal edema, cyanosis, or clubbing.  NEUROLOGIC: Cranial nerves II through XII are intact. Muscle strength 5/5 in all  extremities. Sensation intact. Gait not checked.  PSYCHIATRIC: The patient is alert and oriented x 3.  Normal affect and good eye contact. SKIN: No obvious rash, lesion, or ulcer.  Musculoskeletal: Right knee swelling with lateral tenderness and erythema and generalized swelling with mild decreased range of motion.  Steri-Strips were intact.    LABORATORY PANEL:   CBC Recent Labs  Lab 09/15/20 2305  WBC 19.2*  HGB 10.8*  HCT 33.7*  PLT 440*   ------------------------------------------------------------------------------------------------------------------  Chemistries  Recent Labs  Lab 09/15/20 2305  NA 136  K 2.9*  CL 106  CO2 21*  GLUCOSE 134*  BUN 30*  CREATININE 1.45*  CALCIUM 7.7*  AST 29  ALT 20  ALKPHOS 88  BILITOT 0.7   ------------------------------------------------------------------------------------------------------------------  Cardiac Enzymes No results for input(s): TROPONINI in the last 168 hours. ------------------------------------------------------------------------------------------------------------------  RADIOLOGY:  DG Chest 2 View  Result Date: 09/15/2020 CLINICAL DATA:  Generalized weakness. Recent right knee replacement. EXAM: CHEST - 2 VIEW COMPARISON:  September 04, 2020 FINDINGS: The heart size and mediastinal contours are within normal limits. Both lungs are clear. The visualized skeletal structures are unremarkable. IMPRESSION: No active cardiopulmonary disease. Electronically Signed   By: Virgina Norfolk M.D.   On: 09/15/2020 23:42   DG Knee Complete 4 Views Right  Result Date: 09/15/2020 CLINICAL DATA:  Recent right knee replacement with fever. EXAM: RIGHT KNEE - COMPLETE 4+ VIEW COMPARISON:  September 02, 2020 FINDINGS: A right knee replacement is seen without evidence of surrounding lucency to suggest the presence of hardware loosening or infection. No evidence of an acute fracture or dislocation. A moderate to large joint effusion is  seen. IMPRESSION: 1. Intact right knee replacement. 2. Moderate to large joint effusion. Electronically Signed   By: Virgina Norfolk M.D.   On: 09/15/2020 23:43      IMPRESSION AND PLAN:  Active Problems:   Sepsis ([place])  1.  Sepsis possibly secondary to likely infected right knee with moderate to large effusion status post right total knee arthroplasty, rule out bacteremia. - The patient will be admitted to a progressive unit bed. - We will continue antibiotic therapy with IV aztreonam, vancomycin and Flagyl. - Pain management will be provided. - Orthopedic consultation will be obtained. - Dr. Harlow Mares was notified about the patient.  2.  Acute kidney injury. - The patient will be hydrated with  IV normal saline and will follow BMP.  3.  Hypokalemia. - Replace potassium and check magnesium level.  4.  Type II diabetes mellitus. - We will place the patient on supplement coverage with NovoLog and hold metformin.  5.  Hypothyroidism. - We will continue Synthroid.  6.  Asthma without exacerbation. - We will continue his inhaler and continue the patient on as needed as needed bronchodilator therapy.  DVT prophylaxis: Lovenox. Code Status: full code. Family Communication:  The plan of care was discussed in details with the patient (and family). I answered all questions. The patient agreed to proceed with the above mentioned plan. Further management will depend upon hospital course. Disposition Plan: Back to previous home environment Consults called: none. All the records are reviewed and case discussed with ED provider.  Status is: Inpatient  Remains inpatient appropriate because:Ongoing active pain requiring inpatient pain management, Ongoing diagnostic testing needed not appropriate for outpatient work up, Unsafe d/c plan, IV treatments appropriate due to intensity of illness or inability to take PO, and Inpatient level of care appropriate due to severity of illness  Dispo: The  patient is from: Home              Anticipated d/c is to: Home              Patient currently is not medically stable to d/c.   Difficult to place patient No      TOTAL TIME TAKING CARE OF THIS PATIENT: 55 minutes.    Christel Mormon M.D on 09/16/2020 at 1:28 AM  [facility]   From 7 PM-7 AM, contact night-coverage [email or web address]  CC: Primary care physician; [employer]

## 2020-09-16 NOTE — ED Notes (Addendum)
Per Dr. Donney Rankins, the patient needs to elevate her RLE as much as possible, and ice needs to be applied. Dr. Mack Guise also suggested thigh high TED hose when available. Patient notified. Ice applied.   "You can remove the ace wrap to apply TED stockings but do not remove the aquacel dressings." - Dr. Mack Guise

## 2020-09-16 NOTE — ED Notes (Signed)
Patient reports improvement to SOB and headache at this time. Patient updated on POC, and delay in inpatient bed assignment.

## 2020-09-16 NOTE — Consult Note (Signed)
ORTHOPAEDIC CONSULTATION  REQUESTING PHYSICIAN: Nolberto Hanlon, MD  Chief Complaint: Nausea and chills  HPI: Katherine Estes is a 60 y.o. female s/p right total knee arthroplasty on 09/02/2020.  I saw the patient in my office yesterday.  Patient was doing well at the time.  Her staples were removed from her incision and Steri-Strips were applied with a new bandage.  Patient explains to me today that she began feeling poorly after her Lovenox injection last night.  She states that she felt nausea, lethargy and chills.  She presented to the ER with the symptoms.  Patient met sepsis criteria based on her lactic acid.  She was hypokalemic with a CBC of 19.2.  She was found to be tachycardic with a heart rate of 114 and a respiratory rate of 22.  Past Medical History:  Diagnosis Date   Allergy    Anemia    Arthritis    Asthma    Diabetes mellitus without complication ([place])    H/O nephrolithotomy with removal of calculi    Hypertension    Hypothyroidism    Osteoporosis    Thyroid disease    Past Surgical History:  Procedure Laterality Date   BLADDER SURGERY     BLADDER LIFTED   BREAST BIOPSY Left    neg   BREAST SURGERY     LUMP EXCISION LEFT BREAST.   CESAREAN SECTION     COLONOSCOPY WITH PROPOFOL N/A 04/11/2020   Procedure: COLONOSCOPY WITH PROPOFOL;  Surgeon: Jonathon Bellows, MD;  Location: [facility] ENDOSCOPY;  Service: Endoscopy;  Laterality: N/A;   THYROIDECTOMY     TONSILLECTOMY     TOTAL KNEE ARTHROPLASTY Right 09/02/2020   Procedure: RIGHT TOTAL KNEE ARTHROPLASTY;  Surgeon: Thornton Park, MD;  Location: [facility] ORS;  Service: Orthopedics;  Laterality: Right;   WISDOM TOOTH EXTRACTION     Social History   Socioeconomic History   Marital status: Widowed    Spouse name: Not on file   Number of children: Not on file   Years of education: Not on file   Highest education level: Not on file  Occupational History   Not on file  Tobacco Use   Smoking status: Former    Packs/day:  0.25    Years: 3.00    Pack years: 0.75    Types: Cigarettes    Quit date: 05/21/1991    Years since quitting: 29.3   Smokeless tobacco: Never   Tobacco comments:    she only smoked when she drank  Vaping Use   Vaping Use: Never used  Substance and Sexual Activity   Alcohol use: Yes    Alcohol/week: 0.0 standard drinks    Comment: socially    Drug use: No   Sexual activity: Never  Other Topics Concern   Not on file  Social History Narrative   Not on file   Social Determinants of Health   Financial Resource Strain: Not on file  Food Insecurity: Not on file  Transportation Needs: Not on file  Physical Activity: Not on file  Stress: Not on file  Social Connections: Not on file   Family History  Problem Relation Age of Onset   Arthritis Mother    Asthma Mother    Hypertension Mother    Cancer Maternal Grandmother        THYRIOD   Breast cancer Neg Hx    Allergies  Allergen Reactions   Cinnamon     "not good"   Penicillins Hives and Rash  Received ceftriaxone in 2019   Prior to Admission medications   Medication Sig Start Date End Date Taking? Authorizing Provider  acetaminophen (TYLENOL) 325 MG tablet Take 1-2 tablets (325-650 mg total) by mouth every 6 (six) hours as needed for mild pain (pain score 1-3 or temp > 100.5). 09/08/20 10/08/20 Yes Thornton Park, MD  cholecalciferol (VITAMIN D) 1000 units tablet Take 1,000 Units by mouth daily.   Yes [provider]  enoxaparin (LOVENOX) 40 MG/0.4ML injection Inject 0.4 mLs (40 mg total) into the skin daily. 09/09/20 10/09/20 Yes Thornton Park, MD  Fluticasone-Salmeterol (ADVAIR) 500-50 MCG/DOSE AEPB Inhale 1 puff into the lungs daily as needed (shortness of breath or wheezing).   Yes [provider]  hydrochlorothiazide (HYDRODIURIL) 25 MG tablet Take 25 mg by mouth daily.   Yes [provider]  levothyroxine (SYNTHROID) 150 MCG tablet Take 150 mcg by mouth daily before breakfast.   Yes  [provider]  losartan (COZAAR) 50 MG tablet Take 50 mg by mouth daily.   Yes [provider]  metFORMIN (GLUCOPHAGE) 500 MG tablet Take 500 mg by mouth daily with breakfast.   Yes [provider]  montelukast (SINGULAIR) 10 MG tablet Take 10 mg by mouth at bedtime.   Yes [provider]  oxyCODONE (OXY IR/ROXICODONE) 5 MG immediate release tablet Take 1-2 tablets (5-10 mg total) by mouth every 4 (four) hours as needed for moderate pain (pain score 4-6). 09/08/20  Yes Thornton Park, MD  pantoprazole (PROTONIX) 40 MG tablet Take 1 tablet (40 mg total) by mouth daily. 09/06/20 10/06/20 Yes Elgergawy, Silver Huguenin, MD  potassium chloride (KLOR-CON) 10 MEQ tablet Take 10 mEq by mouth daily. 01/21/20  Yes [provider]  sulfamethoxazole-trimethoprim (BACTRIM DS) 800-160 MG tablet Take 1 tablet by mouth 2 (two) times daily. 09/15/20  Yes [provider]  albuterol (VENTOLIN HFA) 108 (90 Base) MCG/ACT inhaler Inhale 1-2 puffs into the lungs every 6 (six) hours as needed for wheezing or shortness of breath.    [provider]   DG Chest 2 View  Result Date: 09/15/2020 CLINICAL DATA:  Generalized weakness. Recent right knee replacement. EXAM: CHEST - 2 VIEW COMPARISON:  September 04, 2020 FINDINGS: The heart size and mediastinal contours are within normal limits. Both lungs are clear. The visualized skeletal structures are unremarkable. IMPRESSION: No active cardiopulmonary disease. Electronically Signed   By: Virgina Norfolk M.D.   On: 09/15/2020 23:42   DG Knee Complete 4 Views Right  Result Date: 09/15/2020 CLINICAL DATA:  Recent right knee replacement with fever. EXAM: RIGHT KNEE - COMPLETE 4+ VIEW COMPARISON:  September 02, 2020 FINDINGS: A right knee replacement is seen without evidence of surrounding lucency to suggest the presence of hardware loosening or infection. No evidence of an acute fracture or dislocation. A moderate to large joint effusion  is seen. IMPRESSION: 1. Intact right knee replacement. 2. Moderate to large joint effusion. Electronically Signed   By: Virgina Norfolk M.D.   On: 09/15/2020 23:43    Positive ROS: All other systems have been reviewed and were otherwise negative with the exception of those mentioned in the HPI and as above.  Physical Exam: General: Alert, low energy but no acute distress  MUSCULOSKELETAL: Right knee: Patient's bandages have been removed.  Certain Steri-Strips have also been removed.  Patient has slight opening of the proximal portion of her incision where the Steri-Strips have been removed.  There is no active drainage or fluctuance.  There is  resolving ecchymosis diffusely around the right knee with faint erythema around the knee extending into the lower leg.  Her leg compartments are soft and compressible.  She has palpable pedal pulses, intact sensation light touch and intact motor function distally.  She had no significant pain with gentle passive range of motion from 0 to 45 degrees of flexion.  Assessment: Right knee without evidence of septic knee  Plan: I have reviewed the patient's x-rays of the right knee from her admission overnight.  This shows the total knee arthroplasty components are well-positioned.  Radiology report states the patient has a moderate to large effusion which is likely postop hematoma.  Patient has ecchymosis around the right knee with faint erythema both consistent with underlying hematoma.  However given the patient's elevated white count, I decided to aspirate the knee to determine if there is an intra-articular infection.  Patient was prepped with Hibiclens over the lateral right knee.  Patient was preinjected with 10 cc of 1% lidocaine plain in the subcutaneous tissue.  After a few minutes the patient was reprepped and an 18-gauge spinal needle with a 20 cc syringe was inserted through a lateral approach into the right knee joint with the patient's knee fully  extended.  No fluid to be aspirated on multiple passes into the intra-articular portion of the knee joint.  A single drop of clotted bloody fluid was all that was aspirated in the syringe.  There is no evidence of purulence.  I personally reapplied Steri-Strips with Aquacel dressings over the incision.  Patient was rewrapped with an Ace wrap to protect the incision from exposure during this hospitalization.  Patient may begin physical therapy when medically appropriate.  Continue IV antibiotics as prescribed by the hospitalist service.  Continue abscess with work-up.  Urine cultures and blood cultures are pending.  I will continue to follow.   Thornton Park, MD    09/16/2020 1:28 PM

## 2020-09-17 ENCOUNTER — Encounter: Payer: Self-pay | Admitting: Family Medicine

## 2020-09-17 ENCOUNTER — Inpatient Hospital Stay: Payer: Medicaid Other

## 2020-09-17 DIAGNOSIS — A419 Sepsis, unspecified organism: Principal | ICD-10-CM

## 2020-09-17 DIAGNOSIS — M25561 Pain in right knee: Secondary | ICD-10-CM

## 2020-09-17 DIAGNOSIS — N179 Acute kidney failure, unspecified: Secondary | ICD-10-CM

## 2020-09-17 LAB — BASIC METABOLIC PANEL
Anion gap: 6 (ref 5–15)
BUN: 23 mg/dL — ABNORMAL HIGH (ref 6–20)
CO2: 26 mmol/L (ref 22–32)
Calcium: 8.3 mg/dL — ABNORMAL LOW (ref 8.9–10.3)
Chloride: 105 mmol/L (ref 98–111)
Creatinine, Ser: 1.35 mg/dL — ABNORMAL HIGH (ref 0.44–1.00)
GFR, Estimated: 45 mL/min — ABNORMAL LOW (ref >60)
Glucose, Bld: 98 mg/dL (ref 70–99)
Potassium: 3.5 mmol/L (ref 3.5–5.1)
Sodium: 137 mmol/L (ref 135–145)

## 2020-09-17 LAB — CBC
HCT: 27.2 % — ABNORMAL LOW (ref 36.0–46.0)
Hemoglobin: 8.7 g/dL — ABNORMAL LOW (ref 12.0–15.0)
MCH: 28.6 pg (ref 26.0–34.0)
MCHC: 32 g/dL (ref 30.0–36.0)
MCV: 89.5 fL (ref 80.0–100.0)
Platelets: 344 10*3/uL (ref 150–400)
RBC: 3.04 MIL/uL — ABNORMAL LOW (ref 3.87–5.11)
RDW: 15.1 % (ref 11.5–15.5)
WBC: 7.5 10*3/uL (ref 4.0–10.5)
nRBC: 0 % (ref 0.0–0.2)

## 2020-09-17 LAB — TSH: TSH: 0.648 u[IU]/mL (ref 0.350–4.500)

## 2020-09-17 LAB — URINE CULTURE: Culture: NO GROWTH

## 2020-09-17 LAB — CBG MONITORING, ED: Glucose-Capillary: 79 mg/dL (ref 70–99)

## 2020-09-17 LAB — LACTIC ACID, PLASMA
Lactic Acid, Venous: 1.1 mmol/L (ref 0.5–1.9)
Lactic Acid, Venous: 1.6 mmol/L (ref 0.5–1.9)

## 2020-09-17 LAB — PROCALCITONIN: Procalcitonin: 18.18 ng/mL

## 2020-09-17 LAB — GLUCOSE, CAPILLARY
Glucose-Capillary: 91 mg/dL (ref 70–99)
Glucose-Capillary: 133 mg/dL — ABNORMAL HIGH (ref 70–99)
Glucose-Capillary: 100 mg/dL — ABNORMAL HIGH (ref 70–99)

## 2020-09-17 MED ORDER — OXYCODONE-ACETAMINOPHEN 5-325 MG PO TABS
1.0000 | ORAL_TABLET | ORAL | Status: DC | PRN
  Administered 2020-09-17 – 2020-09-18 (×4): 1 via ORAL
  Filled 2020-09-17 (×4): qty 1

## 2020-09-17 MED ORDER — SODIUM CHLORIDE 0.9 % IV SOLN
2.0000 g | Freq: Three times a day (TID) | INTRAVENOUS | Status: AC
  Administered 2020-09-17: 2 g via INTRAVENOUS
  Filled 2020-09-17: qty 2

## 2020-09-17 MED ORDER — SODIUM CHLORIDE 0.9 % IV SOLN
2.0000 g | Freq: Two times a day (BID) | INTRAVENOUS | Status: DC
  Administered 2020-09-18: 2 g via INTRAVENOUS
  Filled 2020-09-17 (×2): qty 2

## 2020-09-17 MED ORDER — VANCOMYCIN HCL 1750 MG/350ML IV SOLN
1750.0000 mg | INTRAVENOUS | Status: DC
  Administered 2020-09-17 – 2020-09-18 (×2): 1750 mg via INTRAVENOUS
  Filled 2020-09-17 (×3): qty 350

## 2020-09-17 MED ORDER — SODIUM CHLORIDE 0.9 % IV SOLN
2.0000 g | Freq: Two times a day (BID) | INTRAVENOUS | Status: DC
  Filled 2020-09-17 (×2): qty 2

## 2020-09-17 MED ORDER — IOHEXOL 300 MG/ML  SOLN
100.0000 mL | Freq: Once | INTRAMUSCULAR | Status: AC | PRN
  Administered 2020-09-17: 100 mL via INTRAVENOUS

## 2020-09-17 MED ORDER — IOHEXOL 9 MG/ML PO SOLN
500.0000 mL | ORAL | Status: AC
  Administered 2020-09-17 (×2): 500 mL via ORAL

## 2020-09-17 MED ORDER — MORPHINE SULFATE (PF) 2 MG/ML IV SOLN: 1.0000 mg | INTRAVENOUS | Status: DC | PRN

## 2020-09-17 NOTE — ED Notes (Signed)
Informed RN bed assigned 

## 2020-09-17 NOTE — Progress Notes (Signed)
Pharmacy Antibiotic Note  Katherine Estes is a 60 y.o. female admitted on 09/15/2020 with sepsis from unknown source, potentially from leg wound.  Pharmacy has been consulted for Vancomycin dosing. Febrile (Tmax 103) >Afeb (on APAP) WBC 19.2>22.2>NNL PCT 6.94>29.99>18.18 Lactic acid 2.4>3.7>1.6  Plan: MRSA PCR ordered; Bcx/Ucx remain NGTD.  Pt given Vancomycin 2 gm once in ED @ 0103 on 09/16/2020. SCr (1.45>1.99>1.35), modified vancomycin 1500mg  Q48hrs > 1750mg  q24hr.  Goal AUC 400-550. Expected AUC: 474.8; cmax 37.9; cmin 9.8 SCr used: 1.35, BMI 42.6, Vd used: 0.5; dosing adj BW (BMI >40)  Continues Aztreonam 2 g Q 8 hrs. Recommend to switch to cefepime 2 g Q 12 hrs as patient has tolerated Ancef and ceftriaxone in the past.   Continue Flagyl 500 mg Q 8 hrs.  Pharmacy will continue to follow and will adjust abx dosing whenever warranted.  Height: 5\' 6"  (167.6 cm) Weight: 119.7 kg (263 lb 14.3 oz) IBW/kg (Calculated) : 59.3  Temp (24hrs), Avg:100 F (37.8 C), Min:98.5 F (36.9 C), Max:102.2 F (39 C)  Recent Labs  Lab 09/15/20 2304 09/15/20 2305 09/16/20 0700 09/16/20 2218 09/17/20 0212 09/17/20 0716  WBC  --  19.2* 22.2*  --   --   --   CREATININE  --  1.45* 1.99*  --   --  1.35*  LATICACIDVEN 2.4*  --  2.7* 3.7* 1.1 1.6     Estimated Creatinine Clearance: 59.1 mL/min (A) (by C-G formula based on SCr of 1.35 mg/dL (H)).    Allergies  Allergen Reactions   Cinnamon     "not good"   Penicillins Hives and Rash    Received ceftriaxone in 2019    Antimicrobials this admission: 6/27 Vancomycin >>  6/27 Flagyl >>  6/27 Aztreonam >>    Microbiology results: 06/27 BCx: NGTD 06/27 UCx: NGTD  Thank you for allowing pharmacy to be a part of this patient's care.  Lorna Dibble, [facility] 09/17/2020 9:50 AM

## 2020-09-17 NOTE — ED Notes (Signed)
Pharmacy contacted about out of stock Flagyl IV. New bag of ice re-applied to patient's right knee. Right leg continues to be elevated on pillow. Patient call light within reach, as well as water, and other personal items. Patient denies needs at this time.

## 2020-09-17 NOTE — ED Notes (Signed)
Patient notified of change in bed assignment and continued wait.

## 2020-09-17 NOTE — Progress Notes (Signed)
PROGRESS NOTE    Katherine Estes  UVO:[id number] DOB: May 22, 1960 DOA: 09/15/2020 PCP: [facility], [place]:   Active Problems:   Sepsis ([place])   Sepsis: met criteria w/ fever, leukocytosis & likely infected right knee w/ recent hx of right total knee arthroplasty. Continue on IV vanco, flagyl. Continue on IVFs. ID consulted.   Right knee pain: w/ likely hematoma. Aspirate of right knee showed only clotted bloody fluid as per ortho surg. Continue on IV vanco, flagyl & cefepime. ID consulted. Blood cxs NGTD.  Recent total right knee arthroplasty. Ortho surg following and recs apprec  Leukocytosis: likely secondary to above. Continue on IV abxs   AKI: Cr is trending down from day prior. Continue on IVFs. Avoid nephrotoxic meds   Hypokalemia: WNL today   DM2: likely poorly controlled. Continue on SSI w/ accuchecks   Hypothyroidism: continue on synthroid   Asthma: unknown severity and/or stage. Without exacerbation. Continue on bronchodilators   Morbid obesity: BMI 42.5. Complicates overall care & prognosis   DVT prophylaxis: lovenox  Code Status:  full  Family Communication:  Disposition Plan: depends on PT/OT recs (not consulted yet)  Level of care: Progressive Cardiac  Status is: Inpatient  Remains inpatient appropriate because:Ongoing diagnostic testing needed not appropriate for outpatient work up, Unsafe d/c plan, IV treatments appropriate due to intensity of illness or inability to take PO, and Inpatient level of care appropriate due to severity of illness  Dispo: The patient is from: Home              Anticipated d/c is to: Home              Patient currently is not medically stable to d/c.   Difficult to place patient: unclear   Consultants:  ID Ortho surg   Procedures:   Antimicrobials: vanco, flagyl & cefepime    Subjective: Pt c/o right knee pain  Objective: Vitals:   09/17/20 0736 09/17/20 0801 09/17/20 0813  09/17/20 1145  BP: 115/75  (!) 123/54 (!) 106/54  Pulse: 86  86 86  Resp: _0 Temp:   98.5 F (36.9 C) 98.6 F (37 C)  TempSrc:      SpO2: 100%  98% 100%  Weight:  119.7 kg    Height:  5' 6" (1.676 m)      Intake/Output Summary (Last 24 hours) at 09/17/2020 1412 Last data filed at 09/17/2020 1020 Gross per 24 hour  Intake 240 ml  Output 2900 ml  Net -2660 ml   Filed Weights   09/15/20 2301 09/17/20 0801  Weight: 104.3 kg 119.7 kg    Examination:  General exam: Appears calm and comfortable  Respiratory system: Clear to auscultation. Respiratory effort normal. Cardiovascular system: S1 & S2 +. No rubs, gallops or clicks.  Gastrointestinal system: Abdomen is obese soft and nontender.  Normal bowel sounds heard. Central nervous system: Alert and oriented. Moves all extremities  Psychiatry: Judgement and insight appear normal. Flat mood and affect    Data Reviewed: I have personally reviewed following labs and imaging studies  CBC: Recent Labs  Lab 09/15/20 2305 09/16/20 0700  WBC 19.2* 22.2*  NEUTROABS 18.1*  --   HGB 10.8* 9.5*  HCT 33.7* 29.5*  MCV 90.1 90.8  PLT 440* 742*   Basic Metabolic Panel: Recent Labs  Lab 09/15/20 2305 09/16/20 0700 09/17/20 0716  NA 136 136 137  K 2.9* 4.3 3.5  CL 106 102  105  CO2 21* 25 26  GLUCOSE 134* 122* 98  BUN 30* 34* 23*  CREATININE 1.45* 1.99* 1.35*  CALCIUM 7.7* 8.2* 8.3*   GFR: Estimated Creatinine Clearance: 59.1 mL/min (A) (by C-G formula based on SCr of 1.35 mg/dL (H)). Liver Function Tests: Recent Labs  Lab 09/15/20 2305  AST 29  ALT 20  ALKPHOS 88  BILITOT 0.7  PROT 7.6  ALBUMIN 2.9*   Recent Labs  Lab 09/15/20 2305  LIPASE 28   No results for input(s): AMMONIA in the last 168 hours. Coagulation Profile: Recent Labs  Lab 09/16/20 0700  INR 1.1   Cardiac Enzymes: No results for input(s): CKTOTAL, CKMB, CKMBINDEX, TROPONINI in the last 168 hours. BNP (last 3 results) No results for  input(s): PROBNP in the last 8760 hours. HbA1C: No results for input(s): HGBA1C in the last 72 hours. CBG: Recent Labs  Lab 09/16/20 1254 09/16/20 1723 09/16/20 2208 09/17/20 0719 09/17/20 1144  GLUCAP 79 92 85 79 91   Lipid Profile: No results for input(s): CHOL, HDL, LDLCALC, TRIG, CHOLHDL, LDLDIRECT in the last 72 hours. Thyroid Function Tests: Recent Labs    09/17/20 0716  TSH 0.648   Anemia Panel: No results for input(s): VITAMINB12, FOLATE, FERRITIN, TIBC, IRON, RETICCTPCT in the last 72 hours. Sepsis Labs: Recent Labs  Lab 09/15/20 2305 09/16/20 0700 09/16/20 2218 09/17/20 0212 09/17/20 0716  PROCALCITON 6.94 29.99  --   --  18.18  LATICACIDVEN  --  2.7* 3.7* 1.1 1.6    Recent Results (from the past 240 hour(s))  Resp Panel by RT-PCR (Flu A&B, Covid) Nasopharyngeal Swab     Status: None   Collection Time: 09/15/20 11:48 PM   Specimen: Nasopharyngeal Swab; Nasopharyngeal(NP) swabs in vial transport medium  Result Value Ref Range Status   SARS Coronavirus 2 by RT PCR NEGATIVE NEGATIVE Final    Comment: (NOTE) SARS-CoV-2 target nucleic acids are NOT DETECTED.  The SARS-CoV-2 RNA is generally detectable in upper respiratory specimens during the acute phase of infection. The lowest concentration of SARS-CoV-2 viral copies this assay can detect is 138 copies/mL. A negative result does not preclude SARS-Cov-2 infection and should not be used as the sole basis for treatment or other patient management decisions. A negative result may occur with  improper specimen collection/handling, submission of specimen other than nasopharyngeal swab, presence of viral mutation(s) within the areas targeted by this assay, and inadequate number of viral copies(<138 copies/mL). A negative result must be combined with clinical observations, patient history, and epidemiological information. The expected result is Negative.  Fact Sheet for Patients:   https://www.fda.gov/media/152166/download  Fact Sheet for Healthcare Providers:  https://www.fda.gov/media/152162/download  This test is no t yet approved or cleared by the [place] FDA and  has been authorized for detection and/or diagnosis of SARS-CoV-2 by FDA under an Emergency Use Authorization (EUA). This EUA will remain  in effect (meaning this test can be used) for the duration of the COVID-19 declaration under Section 564(b)(1) of the Act, 21 U.S.C.section 360bbb-3(b)(1), unless the authorization is terminated  or revoked sooner.       Influenza A by PCR NEGATIVE NEGATIVE Final   Influenza B by PCR NEGATIVE NEGATIVE Final    Comment: (NOTE) The Xpert Xpress SARS-CoV-2/FLU/RSV plus assay is intended as an aid in the diagnosis of influenza from Nasopharyngeal swab specimens and should not be used as a sole basis for treatment. Nasal washings and aspirates are unacceptable for Xpert Xpress SARS-CoV-2/FLU/RSV testing.  Fact Sheet   for Patients: https://www.fda.gov/media/152166/download  Fact Sheet for Healthcare Providers: https://www.fda.gov/media/152162/download  This test is not yet approved or cleared by the [place] FDA and has been authorized for detection and/or diagnosis of SARS-CoV-2 by FDA under an Emergency Use Authorization (EUA). This EUA will remain in effect (meaning this test can be used) for the duration of the COVID-19 declaration under Section 564(b)(1) of the Act, 21 U.S.C. section 360bbb-3(b)(1), unless the authorization is terminated or revoked.  Performed at [facility], [address] [place], [place] [address]   Culture, blood (routine x 2)     Status: None (Preliminary result)   Collection Time: 09/15/20 11:48 PM   Specimen: BLOOD  Result Value Ref Range Status   Specimen Description BLOOD BLOOD LEFT HAND  Final   Special Requests   Final    BOTTLES DRAWN AEROBIC ONLY Blood Culture results may not be optimal due to an  inadequate volume of blood received in culture bottles   Culture   Final    NO GROWTH 1 DAY Performed at [facility], [address] [place], [place] [address]    Report Status PENDING  Incomplete  Culture, blood (routine x 2)     Status: None (Preliminary result)   Collection Time: 09/15/20 11:48 PM   Specimen: BLOOD  Result Value Ref Range Status   Specimen Description BLOOD RIGHT ANTECUBITAL  Final   Special Requests   Final    BOTTLES DRAWN AEROBIC AND ANAEROBIC Blood Culture adequate volume   Culture   Final    NO GROWTH 1 DAY Performed at [facility], [address] [place], [place] [address]    Report Status PENDING  Incomplete  Urine culture     Status: None   Collection Time: 09/15/20 11:48 PM   Specimen: Urine, Random  Result Value Ref Range Status   Specimen Description   Final    URINE, RANDOM Performed at [facility], [address] [place], [place] [address]    Special Requests   Final    NONE Performed at [facility], [address] , [place] [address]    Culture   Final    NO GROWTH Performed at [facility], [address] ,  [address]    Report Status 09/17/2020 FINAL  Final         Radiology Studies: DG Chest 2 View  Result Date: 09/15/2020 CLINICAL DATA:  Generalized weakness. Recent right knee replacement. EXAM: CHEST - 2 VIEW COMPARISON:  September 04, 2020 FINDINGS: The heart size and mediastinal contours are within normal limits. Both lungs are clear. The visualized skeletal structures are unremarkable. IMPRESSION: No active cardiopulmonary disease. Electronically Signed   By: Thaddeus  Houston M.D.   On: 09/15/2020 23:42   DG Knee Complete 4 Views Right  Result Date: 09/15/2020 CLINICAL DATA:  Recent right knee replacement with fever. EXAM: RIGHT KNEE - COMPLETE 4+ VIEW COMPARISON:  September 02, 2020 FINDINGS: A right knee replacement is seen without evidence of surrounding  lucency to suggest the presence of hardware loosening or infection. No evidence of an acute fracture or dislocation. A moderate to large joint effusion is seen. IMPRESSION: 1. Intact right knee replacement. 2. Moderate to large joint effusion. Electronically Signed   By: Thaddeus  Houston M.D.   On: 09/15/2020 23:43        Scheduled Meds:  cholecalciferol  1,000 Units Oral Daily   enoxaparin (LOVENOX) injection  0.5 mg/kg Subcutaneous Q24H     insulin aspart  0-9 Units Subcutaneous TID AC & HS   levothyroxine  150 mcg Oral Q0600   mometasone-formoterol  2 puff Inhalation BID   montelukast  10 mg Oral QHS   potassium chloride  10 mEq Oral Daily   Continuous Infusions:  aztreonam 200 mL/hr at 09/17/20 0829   ceFEPime (MAXIPIME) IV     lactated ringers 125 mL/hr at 09/16/20 2100   metronidazole Stopped (09/16/20 1715)   vancomycin       LOS: 1 day    Time spent:33 mins     Wyvonnia Dusky, MD [facility] Pager [phone]  If 7PM-7AM, please contact night-coverage 09/17/2020, 2:12 PM

## 2020-09-17 NOTE — ED Notes (Signed)
Dr. Sabino Gasser notified of patient temp, and medication per [facility] of Tylenol PRN.

## 2020-09-17 NOTE — Progress Notes (Signed)
Subjective:  Patient seen at 1 pm today.   Patient lying in hospital bed.   Denied significant right knee pain.  Overall states she feels better.  WBC 7.5 today down from 22.    Objective:   VITALS:   Vitals:   09/17/20 0813 09/17/20 1145 09/17/20 1717 09/17/20 1719  BP: (!) 123/54 (!) 106/54 (!) 99/53 (!) 99/51  Pulse: 86 86 85 87  Resp: 18 19 18    Temp: 98.5 F (36.9 C) 98.6 F (37 C) 98.2 F (36.8 C)   TempSrc:   Oral   SpO2: 98% 100% 98%   Weight:      Height:        PHYSICAL EXAM: Right lower extremity Neurovascular intact Sensation intact distally Intact pulses distally Dorsiflexion/Plantar flexion intact Dressing C/D/I Compartments soft  LABS  Results for orders placed or performed during the hospital encounter of 09/15/20 (from the past 24 hour(s))  CBG monitoring, ED     Status: None   Collection Time: 09/16/20 10:08 PM  Result Value Ref Range   Glucose-Capillary 85 70 - 99 mg/dL  Lactic acid, plasma     Status: Abnormal   Collection Time: 09/16/20 10:18 PM  Result Value Ref Range   Lactic Acid, Venous 3.7 (HH) 0.5 - 1.9 mmol/L  Lactic acid, plasma     Status: None   Collection Time: 09/17/20  2:12 AM  Result Value Ref Range   Lactic Acid, Venous 1.1 0.5 - 1.9 mmol/L  TSH     Status: None   Collection Time: 09/17/20  7:16 AM  Result Value Ref Range   TSH 0.648 0.350 - 4.500 uIU/mL  Procalcitonin     Status: None   Collection Time: 09/17/20  7:16 AM  Result Value Ref Range   Procalcitonin 18.18 ng/mL  Lactic acid, plasma     Status: None   Collection Time: 09/17/20  7:16 AM  Result Value Ref Range   Lactic Acid, Venous 1.6 0.5 - 1.9 mmol/L  Basic metabolic panel     Status: Abnormal   Collection Time: 09/17/20  7:16 AM  Result Value Ref Range   Sodium 137 135 - 145 mmol/L   Potassium 3.5 3.5 - 5.1 mmol/L   Chloride 105 98 - 111 mmol/L   CO2 26 22 - 32 mmol/L   Glucose, Bld 98 70 - 99 mg/dL   BUN 23 (H) 6 - 20 mg/dL   Creatinine, Ser 1.35  (H) 0.44 - 1.00 mg/dL   Calcium 8.3 (L) 8.9 - 10.3 mg/dL   GFR, Estimated 45 (L) >60 mL/min   Anion gap 6 5 - 15  CBG monitoring, ED     Status: None   Collection Time: 09/17/20  7:19 AM  Result Value Ref Range   Glucose-Capillary 79 70 - 99 mg/dL  Glucose, capillary     Status: None   Collection Time: 09/17/20 11:44 AM  Result Value Ref Range   Glucose-Capillary 91 70 - 99 mg/dL  CBC     Status: Abnormal   Collection Time: 09/17/20  2:28 PM  Result Value Ref Range   WBC 7.5 4.0 - 10.5 K/uL   RBC 3.04 (L) 3.87 - 5.11 MIL/uL   Hemoglobin 8.7 (L) 12.0 - 15.0 g/dL   HCT 27.2 (L) 36.0 - 46.0 %   MCV 89.5 80.0 - 100.0 fL   MCH 28.6 26.0 - 34.0 pg   MCHC 32.0 30.0 - 36.0 g/dL   RDW 15.1 11.5 - 15.5 %  Platelets 344 150 - 400 K/uL   nRBC 0.0 0.0 - 0.2 %  Glucose, capillary     Status: Abnormal   Collection Time: 09/17/20  5:15 PM  Result Value Ref Range   Glucose-Capillary 133 (H) 70 - 99 mg/dL    DG Chest 2 View  Result Date: 09/15/2020 CLINICAL DATA:  Generalized weakness. Recent right knee replacement. EXAM: CHEST - 2 VIEW COMPARISON:  September 04, 2020 FINDINGS: The heart size and mediastinal contours are within normal limits. Both lungs are clear. The visualized skeletal structures are unremarkable. IMPRESSION: No active cardiopulmonary disease. Electronically Signed   By: Virgina Norfolk M.D.   On: 09/15/2020 23:42   DG Knee Complete 4 Views Right  Result Date: 09/15/2020 CLINICAL DATA:  Recent right knee replacement with fever. EXAM: RIGHT KNEE - COMPLETE 4+ VIEW COMPARISON:  September 02, 2020 FINDINGS: A right knee replacement is seen without evidence of surrounding lucency to suggest the presence of hardware loosening or infection. No evidence of an acute fracture or dislocation. A moderate to large joint effusion is seen. IMPRESSION: 1. Intact right knee replacement. 2. Moderate to large joint effusion. Electronically Signed   By: Virgina Norfolk M.D.   On: 09/15/2020 23:43     Assessment/Plan:     Active Problems:   Sepsis ([place])  Patient has no definitive evidence of a right knee infection at this time.  Aspiration of the knee did not yield fluid other than a drop of blood.   I suspect a large post-op hematoma in the knee as cause for swelling and ecchymosis.  Patient has no pain with right knee motion between 0-45 degrees.  I would recommend continuing antibiotics as prescribed and will reorder PT to start for this patient while an inpatient.  Continue to elevate right lower extremity.   May apply ice to right knee.  Will order duplex ultrasound of the right leg.      Thornton Park , MD 09/17/2020, 5:59 PM

## 2020-09-17 NOTE — ED Notes (Signed)
Patient reports chills. Temperature checked. Patient medicated per MAR.

## 2020-09-17 NOTE — Consult Note (Signed)
NAME: Katherine Estes  DOB: Oct 20, 1960  MRN: [id number]  Date/Time: 09/17/2020 11:38 AM  REQUESTING PROVIDER: Dr.Williams Subjective:  REASON FOR CONSULT: Sepsis ? Katherine Estes is a 60 y.o. female with a history of Diabetes mellitus, hypertension, hypothyroidism, recent right TKA on 09/02/2020 presented to the ED via EMS from home on 09/15/2020 with generalized weakness, nausea after taking a Lovenox shot.  She has a history of left renal calculi needing nephrolithotomy in 2021. Patient had seen her orthopedic surgeon on 09/15/2020 and he had removed the sutures and had felt the knee was doing fine. Patient says she does not have much pain in the knee.  But has pain in the right foot.  Says since she had the nerve block for surgery the foot has been numb. She does not have cough She does not have dysuria but says her urine was dark. She does not have abdominal pain She has no chest pain She is no headache She has 2 turtles at home. Patient states that in March 2019 she was working in [employer] at [facility] and had sudden onset weakness like the presentation this time and was admitted and diagnosed with pneumonia and bacteremia secondary to pneumococcus she was given IV ceftriaxone then.   In the ED her temperature was 103, BP 117/52 pulse of 114, respiratory 22 and sats of 93%.  Labs revealed a WBC of 19.2, Hb of 10.8, platelet of 440 and creatinine of 1.45.  Blood culture was sent.  She was started on vancomycin and aztreonam and Flagyl. X-ray of the right knee was done and that showed effusion.  Orthopedic surgeon saw her and try to aspirate the knee but there was no fluid. I am asked to see the patient for sepsis.  Past Medical History:  Diagnosis Date   Allergy    Anemia    Arthritis    Asthma    Diabetes mellitus without complication ([place])    H/O nephrolithotomy with removal of calculi    Hypertension    Hypothyroidism    Osteoporosis    Thyroid disease     Past Surgical  History:  Procedure Laterality Date   BLADDER SURGERY     BLADDER LIFTED   BREAST BIOPSY Left    neg   BREAST SURGERY     LUMP EXCISION LEFT BREAST.   CESAREAN SECTION     COLONOSCOPY WITH PROPOFOL N/A 04/11/2020   Procedure: COLONOSCOPY WITH PROPOFOL;  Surgeon: Jonathon Bellows, MD;  Location: [facility] ENDOSCOPY;  Service: Endoscopy;  Laterality: N/A;   THYROIDECTOMY     TONSILLECTOMY     TOTAL KNEE ARTHROPLASTY Right 09/02/2020   Procedure: RIGHT TOTAL KNEE ARTHROPLASTY;  Surgeon: Thornton Park, MD;  Location: [facility] ORS;  Service: Orthopedics;  Laterality: Right;   WISDOM TOOTH EXTRACTION      Social History   Socioeconomic History   Marital status: Widowed    Spouse name: Not on file   Number of children: Not on file   Years of education: Not on file   Highest education level: Not on file  Occupational History   Not on file  Tobacco Use   Smoking status: Former    Packs/day: 0.25    Years: 3.00    Pack years: 0.75    Types: Cigarettes    Quit date: 05/21/1991    Years since quitting: 29.3   Smokeless tobacco: Never   Tobacco comments:    she only smoked when she drank  Vaping Use   Vaping  Use: Never used  Substance and Sexual Activity   Alcohol use: Yes    Alcohol/week: 0.0 standard drinks    Comment: socially    Drug use: No   Sexual activity: Never  Other Topics Concern   Not on file  Social History Narrative   Not on file   Social Determinants of Health   Financial Resource Strain: Not on file  Food Insecurity: Not on file  Transportation Needs: Not on file  Physical Activity: Not on file  Stress: Not on file  Social Connections: Not on file  Intimate Partner Violence: Not on file    Family History  Problem Relation Age of Onset   Arthritis Mother    Asthma Mother    Hypertension Mother    Cancer Maternal Grandmother        THYRIOD   Breast cancer Neg Hx    Allergies  Allergen Reactions   Cinnamon     "not good"   Penicillins Hives and Rash     Received ceftriaxone in 2019   I? Current Facility-Administered Medications  Medication Dose Route Frequency Provider Last Rate Last Admin   acetaminophen (TYLENOL) tablet 650 mg  650 mg Oral Q6H PRN Mansy, Jan A, MD   650 mg at 09/17/20 0101   Or   acetaminophen (TYLENOL) suppository 650 mg  650 mg Rectal Q6H PRN Mansy, Jan A, MD       albuterol (PROVENTIL) (2.5 MG/3ML) 0.083% nebulizer solution 2.5 mg  2.5 mg Nebulization Q6H PRN Renda Rolls, RPH   2.5 mg at 09/16/20 1713   aztreonam (AZACTAM) 2 g in sodium chloride 0.9 % 100 mL IVPB  2 g Intravenous Q8H BelueAlver Sorrow, RPH 200 mL/hr at 09/17/20 0829 Restarted at 09/17/20 0829   benzonatate (TESSALON) capsule 200 mg  200 mg Oral TID PRN Mansy, Jan A, MD   200 mg at 09/16/20 1714   cholecalciferol (VITAMIN D3) tablet 1,000 Units  1,000 Units Oral Daily Mansy, Jan A, MD   1,000 Units at 09/17/20 1031   dextromethorphan-guaiFENesin (Zena DM) 30-600 MG per 12 hr tablet 1 tablet  1 tablet Oral BID PRN Mansy, Jan A, MD   1 tablet at 09/16/20 1714   enoxaparin (LOVENOX) injection 52.5 mg  0.5 mg/kg Subcutaneous Q24H Mansy, Jan A, MD   52.5 mg at 09/17/20 0716   insulin aspart (novoLOG) injection 0-9 Units  0-9 Units Subcutaneous TID AC & HS Mansy, Jan A, MD       lactated ringers infusion   Intravenous Continuous Nolberto Hanlon, MD 125 mL/hr at 09/16/20 2100 New Bag at 09/16/20 2100   levothyroxine (SYNTHROID) tablet 150 mcg  150 mcg Oral Q0600 Mansy, Jan A, MD   150 mcg at 09/17/20 1610   magnesium hydroxide (MILK OF MAGNESIA) suspension 30 mL  30 mL Oral Daily PRN Mansy, Jan A, MD       metroNIDAZOLE (FLAGYL) IVPB 500 mg  500 mg Intravenous Q8H Mansy, Arvella Merles, MD   Stopped at 09/16/20 1715   mometasone-formoterol (DULERA) 200-5 MCG/ACT inhaler 2 puff  2 puff Inhalation BID Renda Rolls, RPH   2 puff at 09/16/20 1719   montelukast (SINGULAIR) tablet 10 mg  10 mg Oral QHS Mansy, Jan A, MD   10 mg at 09/16/20 2210   ondansetron (ZOFRAN) tablet  4 mg  4 mg Oral Q6H PRN Mansy, Jan A, MD       Or   ondansetron [facility]) injection 4 mg  4 mg Intravenous Q6H PRN Mansy, Jan A, MD       potassium chloride (KLOR-CON) CR tablet 10 mEq  10 mEq Oral Daily Mansy, Jan A, MD   10 mEq at 09/17/20 1031   traZODone (DESYREL) tablet 25 mg  25 mg Oral QHS PRN Mansy, Jan A, MD   25 mg at 09/16/20 2210   vancomycin (VANCOREADY) IVPB 1750 mg/350 mL  1,750 mg Intravenous Q24H Beers, Brandon D, RPH         Abtx:  Anti-infectives (From admission, onward)    Start     Dose/Rate Route Frequency Ordered Stop   09/18/20 0000  vancomycin (VANCOREADY) IVPB 1500 mg/300 mL  Status:  Discontinued        1,500 mg 150 mL/hr over 120 Minutes Intravenous Every 48 hours 09/16/20 1357 09/17/20 0957   09/17/20 1030  vancomycin (VANCOREADY) IVPB 1750 mg/350 mL        1,750 mg 175 mL/hr over 120 Minutes Intravenous Every 24 hours 09/17/20 0957     09/17/20 0100  vancomycin (VANCOCIN) IVPB 1000 mg/200 mL premix  Status:  Discontinued        1,000 mg 200 mL/hr over 60 Minutes Intravenous Every 24 hours 09/16/20 0651 09/16/20 1357   09/16/20 0800  metroNIDAZOLE (FLAGYL) IVPB 500 mg        500 mg 100 mL/hr over 60 Minutes Intravenous Every 8 hours 09/16/20 0127     09/16/20 0700  aztreonam (AZACTAM) injection 2 g  Status:  Discontinued        2 g Intravenous Every 8 hours 09/16/20 0127 09/16/20 0340   09/16/20 0700  aztreonam (AZACTAM) 2 g in sodium chloride 0.9 % 100 mL IVPB        2 g 200 mL/hr over 30 Minutes Intravenous Every 8 hours 09/16/20 0341     09/16/20 0130  vancomycin (VANCOCIN) IVPB 1000 mg/200 mL premix  Status:  Discontinued        1,000 mg 200 mL/hr over 60 Minutes Intravenous  Once 09/16/20 0127 09/16/20 0137   09/15/20 2330  aztreonam (AZACTAM) 2 g in sodium chloride 0.9 % 100 mL IVPB        2 g 200 mL/hr over 30 Minutes Intravenous  Once 09/15/20 2315 09/16/20 0055   09/15/20 2330  metroNIDAZOLE (FLAGYL) IVPB 500 mg        500 mg 100 mL/hr over 60  Minutes Intravenous  Once 09/15/20 2315 09/16/20 0107   09/15/20 2330  vancomycin (VANCOCIN) IVPB 1000 mg/200 mL premix  Status:  Discontinued        1,000 mg 200 mL/hr over 60 Minutes Intravenous  Once 09/15/20 2315 09/15/20 2328   09/15/20 2330  vancomycin (VANCOREADY) IVPB 2000 mg/400 mL        2,000 mg 200 mL/hr over 120 Minutes Intravenous  Once 09/15/20 2328 09/16/20 0305       REVIEW OF SYSTEMS:  Const:  fever, negative chills, negative weight loss Eyes: negative diplopia or visual changes, negative eye pain ENT: negative coryza, negative sore throat Resp: negative cough, hemoptysis, dyspnea Cards: negative for chest pain, palpitations, lower extremity edema GU: negative for frequency, dysuria and hematuria GI: Negative for abdominal pain, diarrhea, bleeding, constipation Skin: negative for rash and pruritus Heme: negative for easy bruising and gum/nose bleeding MS: generalized weakness Neurolo:, dizziness, no vertigo, memory problems  Psych: negative for feelings of anxiety, depression  Endocrine:  thyroid, diabetes Allergy/Immunology-patient states that her mom told her she had penicillin  allergy as a child.  But she has taken amoxicillin.  She is also taken ceftriaxone before. Objective:  VITALS:  BP (!) 123/54 (BP Location: Right Arm)   Pulse 86   Temp 98.5 F (36.9 C)   Resp 18   Ht 5\' 6"  (1.676 m)   Wt 119.7 kg   LMP 10/03/2015 (Approximate)   SpO2 98%   BMI 42.59 kg/m  PHYSICAL EXAM:  General: Alert, cooperative, no distress, appears stated age.  Head: Normocephalic, without obvious abnormality, atraumatic. Eyes: Conjunctivae clear, anicteric sclerae. Pupils are equal ENT Nares normal. No drainage or sinus tenderness. Lips, mucosa, and tongue normal. No Thrush Neck: Supple, symmetrical, no adenopathy, thyroid: non tender no carotid bruit and no JVD. Back: No CVA tenderness. Lungs: Clear to auscultation bilaterally. No Wheezing or Rhonchi. No  rales. Heart: Regular rate and rhythm, no murmur, rub or gallop. Abdomen: Soft, non-tender,not distended. Bowel sounds normal. No masses Extremities: Right knee TKA site looks fine.  The suture line is approximated.  There is a little dehiscence.  But there is no erythema or obvious discharge. The right foot is swollen and tender to touch.  It is warm.  No erythema.  Numbness of the foot present Skin: No rashes or lesions. Or bruising Lymph: Cervical, supraclavicular normal. Neurologic: Grossly non-focal Pertinent Labs Lab Results CBC    Component Value Date/Time   WBC 22.2 (H) 09/16/2020 0700   RBC 3.25 (L) 09/16/2020 0700   HGB 9.5 (L) 09/16/2020 0700   HCT 29.5 (L) 09/16/2020 0700   PLT 447 (H) 09/16/2020 0700   MCV 90.8 09/16/2020 0700   MCH 29.2 09/16/2020 0700   MCHC 32.2 09/16/2020 0700   RDW 15.1 09/16/2020 0700   LYMPHSABS 0.5 (L) 09/15/2020 2305   MONOABS 0.4 09/15/2020 2305   EOSABS 0.1 09/15/2020 2305   BASOSABS 0.0 09/15/2020 2305    CMP Latest Ref Rng & Units 09/17/2020 09/16/2020 09/15/2020  Glucose 70 - 99 mg/dL 98 122(H) 134(H)  BUN 6 - 20 mg/dL 23(H) 34(H) 30(H)  Creatinine 0.44 - 1.00 mg/dL 1.35(H) 1.99(H) 1.45(H)  Sodium 135 - 145 mmol/L 137 136 136  Potassium 3.5 - 5.1 mmol/L 3.5 4.3 2.9(L)  Chloride 98 - 111 mmol/L 105 102 106  CO2 22 - 32 mmol/L 26 25 21(L)  Calcium 8.9 - 10.3 mg/dL 8.3(L) 8.2(L) 7.7(L)  Total Protein 6.5 - 8.1 g/dL - - 7.6  Total Bilirubin 0.3 - 1.2 mg/dL - - 0.7  Alkaline Phos 38 - 126 U/L - - 88  AST 15 - 41 U/L - - 29  ALT 0 - 44 U/L - - 20      Microbiology: Recent Results (from the past 240 hour(s))  Resp Panel by RT-PCR (Flu A&B, Covid) Nasopharyngeal Swab     Status: None   Collection Time: 09/15/20 11:48 PM   Specimen: Nasopharyngeal Swab; Nasopharyngeal(NP) swabs in vial transport medium  Result Value Ref Range Status   SARS Coronavirus 2 by RT PCR NEGATIVE NEGATIVE Final    Comment: (NOTE) SARS-CoV-2 target nucleic  acids are NOT DETECTED.  The SARS-CoV-2 RNA is generally detectable in upper respiratory specimens during the acute phase of infection. The lowest concentration of SARS-CoV-2 viral copies this assay can detect is 138 copies/mL. A negative result does not preclude SARS-Cov-2 infection and should not be used as the sole basis for treatment or other patient management decisions. A negative result may occur with  improper specimen collection/handling, submission of specimen other than nasopharyngeal swab,  presence of viral mutation(s) within the areas targeted by this assay, and inadequate number of viral copies(<138 copies/mL). A negative result must be combined with clinical observations, patient history, and epidemiological information. The expected result is Negative.  Fact Sheet for Patients:  EntrepreneurPulse.com.au  Fact Sheet for Healthcare Providers:  IncredibleEmployment.be  This test is no t yet approved or cleared by the [place] FDA and  has been authorized for detection and/or diagnosis of SARS-CoV-2 by FDA under an Emergency Use Authorization (EUA). This EUA will remain  in effect (meaning this test can be used) for the duration of the COVID-19 declaration under Section 564(b)(1) of the Act, 21 U.S.C.section 360bbb-3(b)(1), unless the authorization is terminated  or revoked sooner.       Influenza A by PCR NEGATIVE NEGATIVE Final   Influenza B by PCR NEGATIVE NEGATIVE Final    Comment: (NOTE) The Xpert Xpress SARS-CoV-2/FLU/RSV plus assay is intended as an aid in the diagnosis of influenza from Nasopharyngeal swab specimens and should not be used as a sole basis for treatment. Nasal washings and aspirates are unacceptable for Xpert Xpress SARS-CoV-2/FLU/RSV testing.  Fact Sheet for Patients: EntrepreneurPulse.com.au  Fact Sheet for Healthcare Providers: IncredibleEmployment.be  This  test is not yet approved or cleared by the [place] FDA and has been authorized for detection and/or diagnosis of SARS-CoV-2 by FDA under an Emergency Use Authorization (EUA). This EUA will remain in effect (meaning this test can be used) for the duration of the COVID-19 declaration under Section 564(b)(1) of the Act, 21 U.S.C. section 360bbb-3(b)(1), unless the authorization is terminated or revoked.  Performed at [facility], [address] [place], [place] [address]   Culture, blood (routine x 2)     Status: None (Preliminary result)   Collection Time: 09/15/20 11:48 PM   Specimen: BLOOD  Result Value Ref Range Status   Specimen Description BLOOD BLOOD LEFT HAND  Final   Special Requests   Final    BOTTLES DRAWN AEROBIC ONLY Blood Culture results may not be optimal due to an inadequate volume of blood received in culture bottles   Culture   Final    NO GROWTH 1 DAY Performed at [facility], [address] [place], [place] [address]    Report Status PENDING  Incomplete  Culture, blood (routine x 2)     Status: None (Preliminary result)   Collection Time: 09/15/20 11:48 PM   Specimen: BLOOD  Result Value Ref Range Status   Specimen Description BLOOD RIGHT ANTECUBITAL  Final   Special Requests   Final    BOTTLES DRAWN AEROBIC AND ANAEROBIC Blood Culture adequate volume   Culture   Final    NO GROWTH 1 DAY Performed at [facility], [address] [place], [place] [address]    Report Status PENDING  Incomplete  Urine culture     Status: None   Collection Time: 09/15/20 11:48 PM   Specimen: Urine, Random  Result Value Ref Range Status   Specimen Description   Final    URINE, RANDOM Performed at [facility], [address], [place], [place] [address]    Special Requests   Final    NONE Performed at [facility], [address], [place], [place] [address]    Culture   Final    NO GROWTH Performed at [facility], [place] [address], [place], [place] [address]    Report Status 09/17/2020 FINAL  Final  IMAGING RESULTS: X-ray reviewed.  No infiltrate I have personally reviewed the films ? Impression/Recommendation ? Sepsis.  Unclear etiology.  No pneumonia   Right knee surgical site does not look infected. Right foot swelling but no obvious cellulitis. She has a history of left renal calculi needing surgical removal.  In 2021.  Urine culture pending.  Need to rule out urinary tract infection even though UA does not have any WBC. Will get a CT abdomen and pelvis to assess the kidney. Patient currently on vancomycin, aztreonam and Flagyl. DC Flagyl Change aztreonam to cefepime.  Recent right TKA.  Surgical site looks okay.  We will keep a close eye.  Hypertension was on amlodipine, HCTZ and lisinopril.  The amlodipine was stopped during last admission because of hypotension.  Now her BP is soft   PCN allergy reported as a child but has taken amoxicillin and ceftriaxone.  So likely no longer allergic to penicillin.    ___________________________________________________ Discussed with patient, and care team. Note:  This document was prepared using Dragon voice recognition software and may include unintentional dictation errors.

## 2020-09-17 NOTE — ED Notes (Signed)
Patient updated on inpatient bed assignment.

## 2020-09-17 NOTE — ED Notes (Signed)
Phlebotomy at bedside.

## 2020-09-17 NOTE — ED Notes (Signed)
Phlebotomy contacted for lab collection of lactic acid.

## 2020-09-18 ENCOUNTER — Inpatient Hospital Stay: Payer: Medicaid Other

## 2020-09-18 DIAGNOSIS — D649 Anemia, unspecified: Secondary | ICD-10-CM

## 2020-09-18 LAB — BASIC METABOLIC PANEL
Anion gap: 5 (ref 5–15)
BUN: 15 mg/dL (ref 6–20)
CO2: 27 mmol/L (ref 22–32)
Calcium: 8.2 mg/dL — ABNORMAL LOW (ref 8.9–10.3)
Chloride: 106 mmol/L (ref 98–111)
Creatinine, Ser: 1.06 mg/dL — ABNORMAL HIGH (ref 0.44–1.00)
GFR, Estimated: >60 mL/min (ref >60)
Glucose, Bld: 87 mg/dL (ref 70–99)
Potassium: 3.7 mmol/L (ref 3.5–5.1)
Sodium: 138 mmol/L (ref 135–145)

## 2020-09-18 LAB — CBC
HCT: 27.7 % — ABNORMAL LOW (ref 36.0–46.0)
Hemoglobin: 8.8 g/dL — ABNORMAL LOW (ref 12.0–15.0)
MCH: 28.9 pg (ref 26.0–34.0)
MCHC: 31.8 g/dL (ref 30.0–36.0)
MCV: 91.1 fL (ref 80.0–100.0)
Platelets: 337 10*3/uL (ref 150–400)
RBC: 3.04 MIL/uL — ABNORMAL LOW (ref 3.87–5.11)
RDW: 15.2 % (ref 11.5–15.5)
WBC: 6.2 10*3/uL (ref 4.0–10.5)
nRBC: 0 % (ref 0.0–0.2)

## 2020-09-18 LAB — GLUCOSE, CAPILLARY
Glucose-Capillary: 94 mg/dL (ref 70–99)
Glucose-Capillary: 106 mg/dL — ABNORMAL HIGH (ref 70–99)
Glucose-Capillary: 88 mg/dL (ref 70–99)
Glucose-Capillary: 123 mg/dL — ABNORMAL HIGH (ref 70–99)

## 2020-09-18 LAB — PROCALCITONIN: Procalcitonin: 8.98 ng/mL

## 2020-09-18 LAB — CORTISOL-AM, BLOOD: Cortisol - AM: 8.4 ug/dL (ref 6.7–22.6)

## 2020-09-18 MED ORDER — SODIUM CHLORIDE 0.9 % IV SOLN
INTRAVENOUS | Status: DC | PRN
  Administered 2020-09-18: 250 mL via INTRAVENOUS

## 2020-09-18 MED ORDER — FUROSEMIDE 10 MG/ML IJ SOLN
40.0000 mg | Freq: Once | INTRAMUSCULAR | Status: AC
  Administered 2020-09-18: 40 mg via INTRAVENOUS
  Filled 2020-09-18: qty 4

## 2020-09-18 MED ORDER — DIPHENHYDRAMINE HCL 25 MG PO CAPS
25.0000 mg | ORAL_CAPSULE | Freq: Four times a day (QID) | ORAL | Status: DC | PRN
  Administered 2020-09-18 – 2020-09-19 (×2): 25 mg via ORAL
  Filled 2020-09-18 (×2): qty 1

## 2020-09-18 MED ORDER — SODIUM CHLORIDE 0.9 % IV SOLN
2.0000 g | Freq: Three times a day (TID) | INTRAVENOUS | Status: DC
  Administered 2020-09-18 (×2): 2 g via INTRAVENOUS
  Filled 2020-09-18 (×4): qty 2

## 2020-09-18 NOTE — Progress Notes (Signed)
Pharmacy Antibiotic Note  Katherine Estes is a 60 y.o. female admitted on 09/15/2020 with sepsis from unknown source, potentially from leg wound.  Pharmacy has been consulted for Vancomycin dosing.  Febrile (Tmax 103) >Afeb (on APAP) WBC 19.2>22.2>WNL PCT 6.94>29.99>18.18 Lactic acid 2.4>3.7>1.6  Plan: MRSA PCR ordered but not collected; Bcx/Ucx remain NGTD. VSS improving (soft Bps). Afeb x24hrs; last fevered 6/28; leukocytosis resolved. Renal fxn continues to improve. Pt given Vancomycin 2 gm once in ED @ 0103 on 09/16/2020.  Vancomycin 1750mg  q24hr.  Goal AUC 400-550. Expected AUC: 474.8>437 [SCr :1.45>1.99>1.35>1.06]  SCr used: 1.35>1.06, BMI 42.6, Vd used: 0.5; dosing adj BW (BMI >40)  Aztreonam 2 g Q8 hrs -->  Switched to cefepime 2g Q12hrs yesterday PM; now Cefepime 2g q8hrs per current renal improvement.  **has tolerated Ancef and ceftriaxone in the past.  Stopped Flagyl 500 mg Q 8 hrs per ID recommendation.  Pharmacy will continue to follow and will adjust abx dosing whenever warranted.  Height: 5\' 6"  (167.6 cm) Weight: 119.7 kg (263 lb 14.3 oz) IBW/kg (Calculated) : 59.3  Temp (24hrs), Avg:98.2 F (36.8 C), Min:98 F (36.7 C), Max:98.6 F (37 C)  Recent Labs  Lab 09/15/20 2304 09/15/20 2305 09/16/20 0700 09/16/20 2218 09/17/20 0212 09/17/20 0716 09/17/20 1428 09/18/20 0415  WBC  --  19.2* 22.2*  --   --   --  7.5 6.2  CREATININE  --  1.45* 1.99*  --   --  1.35*  --  1.06*  LATICACIDVEN 2.4*  --  2.7* 3.7* 1.1 1.6  --   --      Estimated Creatinine Clearance: 75.3 mL/min (A) (by C-G formula based on SCr of 1.06 mg/dL (H)).    Allergies  Allergen Reactions   Cinnamon     "not good"   Penicillins Hives and Rash    Received ceftriaxone in 2019    Antimicrobials this admission: Ongoing: 6/27 Vancomycin >>  6/27-29 Aztreonam; Cefepime 6/29>> Completed: 6/27-29 Flagyl  Microbiology results: 06/27 BCx: NGTD 06/27 UCx: NGTD 6/29 MRSA PCR -  ordered  Thank you for allowing pharmacy to be a part of this patient's care.  Lorna Dibble, [facility] 09/18/2020 9:34 AM

## 2020-09-18 NOTE — Progress Notes (Signed)
PROGRESS NOTE    Katherine Estes  HBZ:[id number] DOB: [birth year] DOA: 09/15/2020 PCP: [facility], [place]:   Active Problems:   Sepsis ([place])   Sepsis: met criteria w/ fever, leukocytosis & likely infected right knee w/ recent hx of right total knee arthroplasty. Continue on IV vanco, cefepime as per ID.  [place] of b/l LE was neg for DVTs. Resolved  Right knee pain: w/ likely hematoma. Aspirate of right knee showed only clotted bloody fluid as per ortho surg. Continue on IV vanco, cefepime as per ID. Blood cxs NGTD.  Recent total right knee arthroplasty. Ortho surg following and recs apprec  Normocytic anemia: H&H are stable. No need for a transfusion currently   Leukocytosis: resolved   AKI: Cr is trending down from day prior. Avoid nephrotoxic meds   Hypokalemia: within normal limits today   DM2: likely poorly controlled. Continue on SSI w/ accuchecks   Hypothyroidism: continue on levothyroxine   Asthma: unknown severity and/or stage. W/o exacerbation. Continue on bronchodilators   Morbid obesity: BMI 42.5. Complicates overall care & prognosis   DVT prophylaxis: lovenox  Code Status:  full  Family Communication:  Disposition Plan: depends on PT/OT recs (not consulted yet)  Level of care: Progressive Cardiac  Status is: Inpatient  Remains inpatient appropriate because:Ongoing diagnostic testing needed not appropriate for outpatient work up, Unsafe d/c plan, IV treatments appropriate due to intensity of illness or inability to take PO, and Inpatient level of care appropriate due to severity of illness  Dispo: The patient is from: Home              Anticipated d/c is to: Home              Patient currently is not medically stable to d/c.   Difficult to place patient: unclear   Consultants:  ID Ortho surg   Procedures:   Antimicrobials: vanco, cefepime    Subjective: Pt c/o swelling of b/l LE   Objective: Vitals:   09/17/20  1719 09/17/20 2038 09/18/20 0342 09/18/20 0726  BP: (!) 99/51 (!) 103/59 106/66 120/70  Pulse: 87 85 83 84  Resp:  '18 18 18  ' Temp:  98.1 F (36.7 C) 98.2 F (36.8 C) 98 F (36.7 C)  TempSrc:      SpO2:  94% 93% 100%  Weight:      Height:        Intake/Output Summary (Last 24 hours) at 09/18/2020 0728 Last data filed at 09/18/2020 0640 Gross per 24 hour  Intake 1160 ml  Output 3550 ml  Net -2390 ml   Filed Weights   09/15/20 2301 09/17/20 0801  Weight: 104.3 kg 119.7 kg    Examination:  General exam: Appears uncomfortable  Respiratory system: clear breath sounds b/l. No rubs or clicks  Cardiovascular system: S1/S2+. No rubs or clicks  Gastrointestinal system: Abd is soft, NT, ND & hypoactive bowel sounds  Central nervous system: Alert and oriented. Moves all extremities   Psychiatry: judgement and insight appears normal. Flat mood and affect    Data Reviewed: I have personally reviewed following labs and imaging studies  CBC: Recent Labs  Lab 09/15/20 2305 09/16/20 0700 09/17/20 1428 09/18/20 0415  WBC 19.2* 22.2* 7.5 6.2  NEUTROABS 18.1*  --   --   --   HGB 10.8* 9.5* 8.7* 8.8*  HCT 33.7* 29.5* 27.2* 27.7*  MCV 90.1 90.8 89.5 91.1  PLT 440* 447* 344 337  Basic Metabolic Panel: Recent Labs  Lab 09/15/20 2305 09/16/20 0700 09/17/20 0716 09/18/20 0415  NA 136 136 137 138  K 2.9* 4.3 3.5 3.7  CL 106 102 105 106  CO2 21* '25 26 27  ' GLUCOSE 134* 122* 98 87  BUN 30* 34* 23* 15  CREATININE 1.45* 1.99* 1.35* 1.06*  CALCIUM 7.7* 8.2* 8.3* 8.2*   GFR: Estimated Creatinine Clearance: 75.3 mL/min (A) (by C-G formula based on SCr of 1.06 mg/dL (H)). Liver Function Tests: Recent Labs  Lab 09/15/20 2305  AST 29  ALT 20  ALKPHOS 88  BILITOT 0.7  PROT 7.6  ALBUMIN 2.9*   Recent Labs  Lab 09/15/20 2305  LIPASE 28   No results for input(s): AMMONIA in the last 168 hours. Coagulation Profile: Recent Labs  Lab 09/16/20 0700  INR 1.1   Cardiac  Enzymes: No results for input(s): CKTOTAL, CKMB, CKMBINDEX, TROPONINI in the last 168 hours. BNP (last 3 results) No results for input(s): PROBNP in the last 8760 hours. HbA1C: No results for input(s): HGBA1C in the last 72 hours. CBG: Recent Labs  Lab 09/16/20 2208 09/17/20 0719 09/17/20 1144 09/17/20 1715 09/17/20 2119  GLUCAP 85 79 91 133* 100*   Lipid Profile: No results for input(s): CHOL, HDL, LDLCALC, TRIG, CHOLHDL, LDLDIRECT in the last 72 hours. Thyroid Function Tests: Recent Labs    09/17/20 0716  TSH 0.648   Anemia Panel: No results for input(s): VITAMINB12, FOLATE, FERRITIN, TIBC, IRON, RETICCTPCT in the last 72 hours. Sepsis Labs: Recent Labs  Lab 09/15/20 2305 09/16/20 0700 09/16/20 2218 09/17/20 0212 09/17/20 0716 09/18/20 0415  PROCALCITON 6.94 29.99  --   --  18.18 8.98  LATICACIDVEN  --  2.7* 3.7* 1.1 1.6  --     Recent Results (from the past 240 hour(s))  Resp Panel by RT-PCR (Flu A&B, Covid) Nasopharyngeal Swab     Status: None   Collection Time: 09/15/20 11:48 PM   Specimen: Nasopharyngeal Swab; Nasopharyngeal(NP) swabs in vial transport medium  Result Value Ref Range Status   SARS Coronavirus 2 by RT PCR NEGATIVE NEGATIVE Final    Comment: (NOTE) SARS-CoV-2 target nucleic acids are NOT DETECTED.  The SARS-CoV-2 RNA is generally detectable in upper respiratory specimens during the acute phase of infection. The lowest concentration of SARS-CoV-2 viral copies this assay can detect is 138 copies/mL. A negative result does not preclude SARS-Cov-2 infection and should not be used as the sole basis for treatment or other patient management decisions. A negative result may occur with  improper specimen collection/handling, submission of specimen other than nasopharyngeal swab, presence of viral mutation(s) within the areas targeted by this assay, and inadequate number of viral copies(<138 copies/mL). A negative result must be combined  with clinical observations, patient history, and epidemiological information. The expected result is Negative.  Fact Sheet for Patients:  EntrepreneurPulse.com.au  Fact Sheet for Healthcare Providers:  IncredibleEmployment.be  This test is no t yet approved or cleared by the [place] FDA and  has been authorized for detection and/or diagnosis of SARS-CoV-2 by FDA under an Emergency Use Authorization (EUA). This EUA will remain  in effect (meaning this test can be used) for the duration of the COVID-19 declaration under Section 564(b)(1) of the Act, 21 U.S.C.section 360bbb-3(b)(1), unless the authorization is terminated  or revoked sooner.       Influenza A by PCR NEGATIVE NEGATIVE Final   Influenza B by PCR NEGATIVE NEGATIVE Final    Comment: (NOTE) The Xpert  Xpress SARS-CoV-2/FLU/RSV plus assay is intended as an aid in the diagnosis of influenza from Nasopharyngeal swab specimens and should not be used as a sole basis for treatment. Nasal washings and aspirates are unacceptable for Xpert Xpress SARS-CoV-2/FLU/RSV testing.  Fact Sheet for Patients: EntrepreneurPulse.com.au  Fact Sheet for Healthcare Providers: IncredibleEmployment.be  This test is not yet approved or cleared by the [place] FDA and has been authorized for detection and/or diagnosis of SARS-CoV-2 by FDA under an Emergency Use Authorization (EUA). This EUA will remain in effect (meaning this test can be used) for the duration of the COVID-19 declaration under Section 564(b)(1) of the Act, 21 U.S.C. section 360bbb-3(b)(1), unless the authorization is terminated or revoked.  Performed at [facility], [address] [place], [place] [address]   Culture, blood (routine x 2)     Status: None (Preliminary result)   Collection Time: 09/15/20 11:48 PM   Specimen: BLOOD  Result Value Ref Range Status   Specimen  Description BLOOD BLOOD LEFT HAND  Final   Special Requests   Final    BOTTLES DRAWN AEROBIC ONLY Blood Culture results may not be optimal due to an inadequate volume of blood received in culture bottles   Culture   Final    NO GROWTH 1 DAY Performed at [facility], [address], [place], [place] [address]    Report Status PENDING  Incomplete  Culture, blood (routine x 2)     Status: None (Preliminary result)   Collection Time: 09/15/20 11:48 PM   Specimen: BLOOD  Result Value Ref Range Status   Specimen Description BLOOD RIGHT ANTECUBITAL  Final   Special Requests   Final    BOTTLES DRAWN AEROBIC AND ANAEROBIC Blood Culture adequate volume   Culture   Final    NO GROWTH 1 DAY Performed at [facility], [address], [place], [place] [address]    Report Status PENDING  Incomplete  Urine culture     Status: None   Collection Time: 09/15/20 11:48 PM   Specimen: Urine, Random  Result Value Ref Range Status   Specimen Description   Final    URINE, RANDOM Performed at [facility], [address] [place], [place] [address]    Special Requests   Final    NONE Performed at [facility], [address], [place], [place] [address]    Culture   Final    NO GROWTH Performed at [facility], [place] [address], [place], [place] [address]    Report Status 09/17/2020 FINAL  Final         Radiology Studies: CT ABDOMEN PELVIS W CONTRAST  Result Date: 09/17/2020 CLINICAL DATA:  Sepsis, generalized abdominal pain, concern for urinary pathology EXAM: CT ABDOMEN AND PELVIS WITH CONTRAST TECHNIQUE: Multidetector CT imaging of the abdomen and pelvis was performed using the standard protocol following bolus administration of intravenous contrast. CONTRAST:  141m OMNIPAQUE IOHEXOL 300 MG/ML  SOLN COMPARISON:  None. FINDINGS: Lower chest: Some atelectatic changes seen dependently in the lung bases. Normal heart size. No pericardial effusion.  Hepatobiliary: No worrisome focal liver lesions. Smooth liver surface contour. Normal hepatic attenuation. Normal gallbladder and biliary tree. Pancreas: No pancreatic ductal dilatation or surrounding inflammatory changes. Spleen: Normal in size. No concerning splenic lesions. Adrenals/Urinary Tract: Normal adrenal glands. Low positioning of the right kidney with under rotation with the renal pelvis directed anteriorly. Under rotation of the left kidney noted as well. Bilateral extrarenal pelves are  present. There is asymmetrically increased left perinephric stranding. A slightly delayed left nephrogram is noted on excretory delayed phase imaging as well. Mild urothelial thickening on the left is present. Few scattered punctate nonobstructing calculi measuring up to 3 mm are seen in the upper and lower pole left kidney. No obstructive urolithiasis or frank hydronephrosis is seen. No visible distal ureteral calculi. Urinary bladder is unremarkable for the degree of distention. Stomach/Bowel: Distal esophagus, stomach and duodenum are unremarkable. No small bowel thickening or dilatation. Noninflamed appendix in the right lower quadrant moderate colonic stool burden. No colonic dilatation or wall thickening. Vascular/Lymphatic: Atherosclerotic calcifications within the abdominal aorta and branch vessels. No aneurysm or ectasia. No enlarged abdominopelvic lymph nodes. Reproductive: Anteverted uterus displaced slightly leftward. Suspect an ill-defined anterior fundal fibroid with lobular fundal contours. No concerning adnexal masses or lesions. Other: No free abdominopelvic air or fluid. Tiny fat containing umbilical hernia. Subcutaneous soft tissue gas along the anterior abdominal wall, likely related to injectable use. Mild body wall edema most pronounced towards the flanks. Musculoskeletal: Multilevel degenerative changes are present in the imaged portions of the spine. No acute osseous abnormality or suspicious  osseous lesion. IMPRESSION: 1. Mild asymmetric left perinephric stranding and urothelial thickening with slightly delayed left renal nephrogram. No frank hydronephrosis or obstructing calculus is seen at this time. Findings could reflect developing pyelonephritis/ascending tract infection or a recently passed calculus. Correlate with urinalysis. 2. Additional punctate nonobstructing calculi present in the upper and lower pole left kidney. 3. Low positioning of the right kidney with bilateral renal under rotation and extrarenal pelves, anatomic variant. 4.  Aortic Atherosclerosis (ICD10-I70.0). 5. Subcutaneous gas along the left anterior abdominal wall, correlate for injectable use. 6. Suspect fundal uterine fibroid, a poorly visualized on this exam. Consider outpatient pelvic ultrasound as clinically warranted. Electronically Signed   By: Lovena Le M.D.   On: 09/17/2020 21:21        Scheduled Meds:  cholecalciferol  1,000 Units Oral Daily   enoxaparin (LOVENOX) injection  0.5 mg/kg Subcutaneous Q24H   insulin aspart  0-9 Units Subcutaneous TID AC & HS   levothyroxine  150 mcg Oral Q0600   mometasone-formoterol  2 puff Inhalation BID   montelukast  10 mg Oral QHS   potassium chloride  10 mEq Oral Daily   Continuous Infusions:  ceFEPime (MAXIPIME) IV     lactated ringers 100 mL/hr at 09/17/20 1838   vancomycin 1,750 mg (09/17/20 1233)     LOS: 2 days    Time spent: 31 mins     Wyvonnia Dusky, MD [facility] Pager [phone]  If 7PM-7AM, please contact night-coverage 09/18/2020, 7:28 AM

## 2020-09-18 NOTE — Evaluation (Signed)
Physical Therapy Evaluation Patient Details Name: Katherine Estes MRN: [id number] DOB: 03-Nov-1960 Today's Date: 09/18/2020   History of Present Illness  Patient is a 60 year old female who presents to the ED from home with a chief complaint of fever, nausea and generalized malaise.  Patient had right TKR 09/02/2020. Aspiration of the knee performed yielding one drop of blood. Large post-op hematoma in the knee suspected to be cause of swelling and ecchymosis. Patient being treated for sepsis.Bilateral doppler negative for DVT per report.   Clinical Impression  Patient agreeable to PT and reports her family has been helping her complete exercises at home following recent TKA. Patient required only supervision level of assistance with bed mobility, transfers, and ambulation. She is using a rolling walker for ambulation with impaired gait pattern. Patient has limited right knee AROM following her recent TKA. Recommend PT to maximize independence with mobility, improve gait pattern, and increase right knee ROM to promote independence and decreased risk for falls at home. HHPT is recommended at discharge.     Follow Up Recommendations Home health PT    Equipment Recommendations  None recommended by PT    Recommendations for Other Services       Precautions / Restrictions Precautions Precautions: Knee;Fall Restrictions Weight Bearing Restrictions: Yes RLE Weight Bearing: Weight bearing as tolerated      Mobility  Bed Mobility Overal bed mobility: Needs Assistance Bed Mobility: Supine to Sit     Supine to sit: Supervision;HOB elevated Sit to supine: Supervision;HOB elevated   General bed mobility comments: increased time required to complete tasks. patient using left leg to assist movement of RLE with bed mobility. no physical assistance required    Transfers Overall transfer level: Needs assistance Equipment used: Rolling walker (2 wheeled) Transfers: Sit to/from Stand Sit to  Stand: Supervision         General transfer comment: supervision for safety. limited knee flexion with transfers.  Ambulation/Gait Ambulation/Gait assistance: Supervision Gait Distance (Feet): 20 Feet Assistive device: Rolling walker (2 wheeled) Gait Pattern/deviations: Step-to pattern (decreased heel strike RLE) Gait velocity: decreased   General Gait Details: verbal cues for heel-toe and reciprocal gait pattern. no increased pain is reported with activity  Stairs            Wheelchair Mobility    Modified Rankin (Stroke Patients Only)       Balance Overall balance assessment: Needs assistance Sitting-balance support: Feet supported Sitting balance-Leahy Scale: Good     Standing balance support: During functional activity;Bilateral upper extremity supported Standing balance-Leahy Scale: Fair Standing balance comment: patient relying on rolling walker for UE support                             Pertinent Vitals/Pain Pain Assessment: 0-10 Pain Score: 6  Pain Location: arch of right foot, proximal right knee Pain Descriptors / Indicators: Sharp Pain Intervention(s): Monitored during session;RN gave pain meds during session    [place] expects to be discharged to:: Private residence Living Arrangements: Parent;Other relatives Available Help at Discharge: Family;Available 24 hours/day Type of Home: House Home Access: Level entry     Home Layout: Two level;Able to live on main level with bedroom/bathroom (patient has been on the first floor with bed and BSC) Home Equipment: Walker - 2 wheels;Walker - 4 wheels;Bedside commode      Prior Function Level of Independence: Independent with assistive device(s)  Comments: Patient ambulating with rolling walker at home. She states she was supposed to start [place] soon. Her brother helps her with exercises at home following TKA. No assistance with mobility required at home      Hand Dominance        Extremity/Trunk Assessment   Upper Extremity Assessment Upper Extremity Assessment: Overall WFL for tasks assessed    Lower Extremity Assessment Lower Extremity Assessment: Generalized weakness RLE Deficits / Details: guarding due to pain with limited active knee flexion (57 degrees). SLR with AAROM. no knee buckling with weight bearing       Communication   Communication: No difficulties  Cognition Arousal/Alertness: Awake/alert Behavior During Therapy: WFL for tasks assessed/performed Overall Cognitive Status: Within Functional Limits for tasks assessed                                        General Comments      Exercises Total Joint Exercises Ankle Circles/Pumps: AROM;Strengthening;10 reps;Supine Quad Sets: AROM;Strengthening;Right;10 reps;Supine Hip ABduction/ADduction: AROM;Strengthening;Right;10 reps;Supine Straight Leg Raises: AAROM;Strengthening;Right;10 reps;Supine Goniometric ROM: 10-57 degrees right knee AROM Other Exercises Other Exercises: verbal cues for exercise technique for strengthening. educated patient on positioning and to avoid prolonged knee flexion while in the bed   Assessment/Plan    PT Assessment Patient needs continued PT services  PT Problem List Decreased strength;Decreased range of motion;Decreased balance;Decreased mobility;Decreased activity tolerance;Decreased safety awareness;Pain       PT Treatment Interventions DME instruction;Stair training;Gait training;Functional mobility training;Therapeutic activities;Therapeutic exercise;Balance training;Neuromuscular re-education;Patient/family education    PT Goals (Current goals can be found in the Care Plan section)  Acute Rehab PT Goals Patient Stated Goal: to have more movement in right knee, to go home PT Goal Formulation: With patient Time For Goal Achievement: 10/02/20 Potential to Achieve Goals: Good    Frequency Min 2X/week    Barriers to discharge        Co-evaluation               AM-PAC PT "6 Clicks" Mobility  Outcome Measure Help needed turning from your back to your side while in a flat bed without using bedrails?: None Help needed moving from lying on your back to sitting on the side of a flat bed without using bedrails?: A Little Help needed moving to and from a bed to a chair (including a wheelchair)?: A Little Help needed standing up from a chair using your arms (e.g., wheelchair or bedside chair)?: A Little Help needed to walk in hospital room?: A Little Help needed climbing 3-5 steps with a railing? : A Little 6 Click Score: 19    End of Session   Activity Tolerance: Patient tolerated treatment well Patient left: in bed;with call bell/phone within reach;with bed alarm set Nurse Communication: Mobility status PT Visit Diagnosis: Other abnormalities of gait and mobility (R26.89);Muscle weakness (generalized) (M62.81)    Time: 6962-9528 PT Time Calculation (min) (ACUTE ONLY): 34 min   Charges:   PT Evaluation $PT Eval Moderate Complexity: 1 Mod PT Treatments $Therapeutic Exercise: 8-22 mins        Minna Merritts, PT, MPT   Percell Locus 09/18/2020, 1:13 PM

## 2020-09-18 NOTE — Progress Notes (Signed)
Subjective:  Patient is seen in her hospital room today.  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  Patient began physical therapy today.  Patient states she felt well this morning but has had increased swelling in her right lower extremity after physical therapy.  Objective:   VITALS:   Vitals:   09/17/20 2038 09/18/20 0342 09/18/20 0726 09/18/20 1126  BP: (!) 103/59 106/66 120/70 122/72  Pulse: 85 83 84 78  Resp: 18 18 18 17   Temp: 98.1 F (36.7 C) 98.2 F (36.8 C) 98 F (36.7 C) 99.7 F (37.6 C)  TempSrc:      SpO2: 94% 93% 100% 93%  Weight:      Height:        PHYSICAL EXAM: Right lower extremity: Aquacel dressings show a single spot of serosanguineous drainage at the superior pole.  Her compartments are soft and compressible.  There is no erythema.  She is neurovascular intact. Neurovascular intact Sensation intact distally Intact pulses distally Dorsiflexion/Plantar flexion intact Incision: dressing C/D/I No cellulitis present Compartment soft  LABS  Results for orders placed or performed during the hospital encounter of 09/15/20 (from the past 24 hour(s))  CBC     Status: Abnormal   Collection Time: 09/17/20  2:28 PM  Result Value Ref Range   WBC 7.5 4.0 - 10.5 K/uL   RBC 3.04 (L) 3.87 - 5.11 MIL/uL   Hemoglobin 8.7 (L) 12.0 - 15.0 g/dL   HCT 27.2 (L) 36.0 - 46.0 %   MCV 89.5 80.0 - 100.0 fL   MCH 28.6 26.0 - 34.0 pg   MCHC 32.0 30.0 - 36.0 g/dL   RDW 15.1 11.5 - 15.5 %   Platelets 344 150 - 400 K/uL   nRBC 0.0 0.0 - 0.2 %  Glucose, capillary     Status: Abnormal   Collection Time: 09/17/20  5:15 PM  Result Value Ref Range   Glucose-Capillary 133 (H) 70 - 99 mg/dL  Glucose, capillary     Status: Abnormal   Collection Time: 09/17/20  9:19 PM  Result Value Ref Range   Glucose-Capillary 100 (H) 70 - 99 mg/dL  Procalcitonin     Status: None   Collection Time: 09/18/20  4:15 AM  Result Value Ref Range   Procalcitonin 8.98 ng/mL   Cortisol-am, blood     Status: None   Collection Time: 09/18/20  4:15 AM  Result Value Ref Range   Cortisol - AM 8.4 6.7 - 22.6 ug/dL  CBC     Status: Abnormal   Collection Time: 09/18/20  4:15 AM  Result Value Ref Range   WBC 6.2 4.0 - 10.5 K/uL   RBC 3.04 (L) 3.87 - 5.11 MIL/uL   Hemoglobin 8.8 (L) 12.0 - 15.0 g/dL   HCT 27.7 (L) 36.0 - 46.0 %   MCV 91.1 80.0 - 100.0 fL   MCH 28.9 26.0 - 34.0 pg   MCHC 31.8 30.0 - 36.0 g/dL   RDW 15.2 11.5 - 15.5 %   Platelets 337 150 - 400 K/uL   nRBC 0.0 0.0 - 0.2 %  Basic metabolic panel     Status: Abnormal   Collection Time: 09/18/20  4:15 AM  Result Value Ref Range   Sodium 138 135 - 145 mmol/L   Potassium 3.7 3.5 - 5.1 mmol/L   Chloride 106 98 - 111 mmol/L   CO2 27 22 - 32 mmol/L   Glucose, Bld 87 70 - 99 mg/dL   BUN  15 6 - 20 mg/dL   Creatinine, Ser 1.06 (H) 0.44 - 1.00 mg/dL   Calcium 8.2 (L) 8.9 - 10.3 mg/dL   GFR, Estimated >60 >60 mL/min   Anion gap 5 5 - 15  Glucose, capillary     Status: None   Collection Time: 09/18/20  7:31 AM  Result Value Ref Range   Glucose-Capillary 94 70 - 99 mg/dL  Glucose, capillary     Status: Abnormal   Collection Time: 09/18/20 11:29 AM  Result Value Ref Range   Glucose-Capillary 106 (H) 70 - 99 mg/dL    CT ABDOMEN PELVIS W CONTRAST  Result Date: 09/17/2020 CLINICAL DATA:  Sepsis, generalized abdominal pain, concern for urinary pathology EXAM: CT ABDOMEN AND PELVIS WITH CONTRAST TECHNIQUE: Multidetector CT imaging of the abdomen and pelvis was performed using the standard protocol following bolus administration of intravenous contrast. CONTRAST:  16mL OMNIPAQUE IOHEXOL 300 MG/ML  SOLN COMPARISON:  None. FINDINGS: Lower chest: Some atelectatic changes seen dependently in the lung bases. Normal heart size. No pericardial effusion. Hepatobiliary: No worrisome focal liver lesions. Smooth liver surface contour. Normal hepatic attenuation. Normal gallbladder and biliary tree. Pancreas: No pancreatic  ductal dilatation or surrounding inflammatory changes. Spleen: Normal in size. No concerning splenic lesions. Adrenals/Urinary Tract: Normal adrenal glands. Low positioning of the right kidney with under rotation with the renal pelvis directed anteriorly. Under rotation of the left kidney noted as well. Bilateral extrarenal pelves are present. There is asymmetrically increased left perinephric stranding. A slightly delayed left nephrogram is noted on excretory delayed phase imaging as well. Mild urothelial thickening on the left is present. Few scattered punctate nonobstructing calculi measuring up to 3 mm are seen in the upper and lower pole left kidney. No obstructive urolithiasis or frank hydronephrosis is seen. No visible distal ureteral calculi. Urinary bladder is unremarkable for the degree of distention. Stomach/Bowel: Distal esophagus, stomach and duodenum are unremarkable. No small bowel thickening or dilatation. Noninflamed appendix in the right lower quadrant moderate colonic stool burden. No colonic dilatation or wall thickening. Vascular/Lymphatic: Atherosclerotic calcifications within the abdominal aorta and branch vessels. No aneurysm or ectasia. No enlarged abdominopelvic lymph nodes. Reproductive: Anteverted uterus displaced slightly leftward. Suspect an ill-defined anterior fundal fibroid with lobular fundal contours. No concerning adnexal masses or lesions. Other: No free abdominopelvic air or fluid. Tiny fat containing umbilical hernia. Subcutaneous soft tissue gas along the anterior abdominal wall, likely related to injectable use. Mild body wall edema most pronounced towards the flanks. Musculoskeletal: Multilevel degenerative changes are present in the imaged portions of the spine. No acute osseous abnormality or suspicious osseous lesion. IMPRESSION: 1. Mild asymmetric left perinephric stranding and urothelial thickening with slightly delayed left renal nephrogram. No frank hydronephrosis or  obstructing calculus is seen at this time. Findings could reflect developing pyelonephritis/ascending tract infection or a recently passed calculus. Correlate with urinalysis. 2. Additional punctate nonobstructing calculi present in the upper and lower pole left kidney. 3. Low positioning of the right kidney with bilateral renal under rotation and extrarenal pelves, anatomic variant. 4.  Aortic Atherosclerosis (ICD10-I70.0). 5. Subcutaneous gas along the left anterior abdominal wall, correlate for injectable use. 6. Suspect fundal uterine fibroid, a poorly visualized on this exam. Consider outpatient pelvic ultrasound as clinically warranted. Electronically Signed   By: Lovena Le M.D.   On: 09/17/2020 21:21   US Venous Img Lower Bilateral (DVT)  Result Date: 09/18/2020 CLINICAL DATA:  Bilateral lower extremity pain and edema. History of right total knee replacement  two weeks ago. Evaluate for DVT. EXAM: BILATERAL LOWER EXTREMITY VENOUS DOPPLER ULTRASOUND TECHNIQUE: Gray-scale sonography with graded compression, as well as color Doppler and duplex ultrasound were performed to evaluate the lower extremity deep venous systems from the level of the common femoral vein and including the common femoral, femoral, profunda femoral, popliteal and calf veins including the posterior tibial, peroneal and gastrocnemius veins when visible. The superficial great saphenous vein was also interrogated. Spectral Doppler was utilized to evaluate flow at rest and with distal augmentation maneuvers in the common femoral, femoral and popliteal veins. COMPARISON:  None. FINDINGS: RIGHT LOWER EXTREMITY Common Femoral Vein: No evidence of thrombus. Normal compressibility, respiratory phasicity and response to augmentation. Saphenofemoral Junction: No evidence of thrombus. Normal compressibility and flow on color Doppler imaging. Profunda Femoral Vein: No evidence of thrombus. Normal compressibility and flow on color Doppler imaging.  Femoral Vein: No evidence of thrombus. Normal compressibility, respiratory phasicity and response to augmentation. Popliteal Vein: No evidence of thrombus. Normal compressibility, respiratory phasicity and response to augmentation. Calf Veins: No evidence of thrombus. Normal compressibility and flow on color Doppler imaging. Superficial Great Saphenous Vein: No evidence of thrombus. Normal compressibility. Venous Reflux:  None. Other Findings:  None. LEFT LOWER EXTREMITY Common Femoral Vein: No evidence of thrombus. Normal compressibility, respiratory phasicity and response to augmentation. Saphenofemoral Junction: No evidence of thrombus. Normal compressibility and flow on color Doppler imaging. Profunda Femoral Vein: No evidence of thrombus. Normal compressibility and flow on color Doppler imaging. Femoral Vein: No evidence of thrombus. Normal compressibility, respiratory phasicity and response to augmentation. Popliteal Vein: No evidence of thrombus. Normal compressibility, respiratory phasicity and response to augmentation. Calf Veins: No evidence of thrombus. Normal compressibility and flow on color Doppler imaging. Superficial Great Saphenous Vein: No evidence of thrombus. Normal compressibility. Venous Reflux:  None. Other Findings:  None. IMPRESSION: No evidence of DVT within either lower extremity. Electronically Signed   By: Sandi Mariscal M.D.   On: 09/18/2020 09:37    Assessment/Plan:     Active Problems:   Sepsis ([place])  Patient's white blood cell count has normalized.  She has been afebrile.  Duplex ultrasound scans of both lower extremities showed no evidence of DVT.  CT scan demonstrates "Mild asymmetric left perinephric stranding and urothelial thickening with slightly delayed left renal nephrogram. No frank hydronephrosis or obstructing calculus is seen at this time. Findings could reflect developing pyelonephritis/ascending tract infection or a recently passed calculus."  Recommend continuing  IV antibiotics per hospitalist and infectious disease.  Patient clinically is not showing signs consistent with a right septic knee.  Continue with physical therapy.  Patient is okay from orthopedic standpoint to be discharged once cleared medically.  Will defer to medicine and infectious disease regarding oral antibiotics upon discharge.  Continue Lovenox for DVT prophylaxis while an inpatient.  Patient may take enteric-coated aspirin 325 mg p.o. twice daily for DVT prophylaxis upon discharge from the hospital.    Thornton Park , MD 09/18/2020, 1:56 PM

## 2020-09-19 DIAGNOSIS — N12 Tubulo-interstitial nephritis, not specified as acute or chronic: Secondary | ICD-10-CM

## 2020-09-19 DIAGNOSIS — E1169 Type 2 diabetes mellitus with other specified complication: Secondary | ICD-10-CM

## 2020-09-19 LAB — CBC
HCT: 29.2 % — ABNORMAL LOW (ref 36.0–46.0)
Hemoglobin: 9 g/dL — ABNORMAL LOW (ref 12.0–15.0)
MCH: 28.7 pg (ref 26.0–34.0)
MCHC: 30.8 g/dL (ref 30.0–36.0)
MCV: 93 fL (ref 80.0–100.0)
Platelets: 331 10*3/uL (ref 150–400)
RBC: 3.14 MIL/uL — ABNORMAL LOW (ref 3.87–5.11)
RDW: 15 % (ref 11.5–15.5)
WBC: 6.1 10*3/uL (ref 4.0–10.5)
nRBC: 0 % (ref 0.0–0.2)

## 2020-09-19 LAB — BASIC METABOLIC PANEL
Anion gap: 7 (ref 5–15)
BUN: 19 mg/dL (ref 6–20)
CO2: 28 mmol/L (ref 22–32)
Calcium: 8.2 mg/dL — ABNORMAL LOW (ref 8.9–10.3)
Chloride: 106 mmol/L (ref 98–111)
Creatinine, Ser: 1.08 mg/dL — ABNORMAL HIGH (ref 0.44–1.00)
GFR, Estimated: 59 mL/min — ABNORMAL LOW (ref >60)
Glucose, Bld: 93 mg/dL (ref 70–99)
Potassium: 3.6 mmol/L (ref 3.5–5.1)
Sodium: 141 mmol/L (ref 135–145)

## 2020-09-19 LAB — GLUCOSE, CAPILLARY
Glucose-Capillary: 112 mg/dL — ABNORMAL HIGH (ref 70–99)
Glucose-Capillary: 111 mg/dL — ABNORMAL HIGH (ref 70–99)

## 2020-09-19 MED ORDER — CEFAZOLIN SODIUM-DEXTROSE 2-4 GM/100ML-% IV SOLN
2.0000 g | Freq: Three times a day (TID) | INTRAVENOUS | Status: DC
  Administered 2020-09-19: 2 g via INTRAVENOUS
  Filled 2020-09-19 (×5): qty 100

## 2020-09-19 MED ORDER — ASPIRIN EC 325 MG PO TBEC: 325.0000 mg | DELAYED_RELEASE_TABLET | Freq: Two times a day (BID) | ORAL | 0 refills | Status: AC

## 2020-09-19 MED ORDER — CEFADROXIL 500 MG PO CAPS: 500.0000 mg | ORAL_CAPSULE | Freq: Two times a day (BID) | ORAL | 0 refills | Status: AC

## 2020-09-19 NOTE — Discharge Summary (Signed)
Physician Discharge Summary  Katherine Estes SAY:[id number] DOB: 15-Aug-1960 DOA: 09/15/2020  PCP: [facility] [place] date: 09/15/2020 Discharge date: 09/19/2020  Admitted From: home  Disposition: home   Recommendations for Outpatient Follow-up:  Follow up with PCP in 1-2 weeks F/u w/ ortho surg in 1 week    Home Health: no Equipment/Devices:  Discharge Condition: stable  CODE STATUS: full Diet recommendation: Carb Modified   Brief/Interim Summary: HPI was taken from Dr. Sidney Ace: Katherine Estes is a 60 y.o. African-American female with medical history significant for hypertension, hypothyroidism, diabetes mellitus and asthma, who presented to the ER with acute onset of fever with chills and worsening right knee swelling with associated tenderness and mild redness and pain.  She develops a dry cough without wheezing or dyspnea.  No nausea or vomiting or diarrhea or abdominal pain.  No chest pain or palpitations.  No dysuria, oliguria or hematuria or flank pain.  No rhinorrhea or nasal congestion or sore throat or earache.   ED Course: Upon presentation to the ER vital signs are within normal and later heart rate was at 114 and respiratory rate 22.  Labs revealed hypokalemia of 2.9 and a BUN of 30 with creatinine 1.45 previously normal and calcium 7.7.  Albumin was 2.9.  Lactic acid was 2.4 and the rest of 6.94 CBC showed leukocytosis 19.2 with neutrophilia and anemia with thrombocytosis.  UA came back with rare bacteria and only 0-5 WBCs.  Once antigens and COVID-19 PCR came back negative.  Urine and blood cultures were sent.     Imaging: Two-view chest x-ray showed no acute cardiopulmonary disease.  Right knee x-ray showed intact right knee replacement and moderate to large joint effusion.  The patient was given 30 mill per kilogram of IV lactated Ringer bolus followed by infusion, 2 g of IV aztreonam, IV Flagyl and vancomycin, 4 mg of IV Zofran and 21: IV potassium  chloride.  She will be admitted to a progressive unit bed for further evaluation and management.  Hospital course from Dr. Jimmye Norman 6/29-09/19/20: Pt presented w/ sepsis of unknown etiology. Pt was initially thought have right knee septic arthritis but aspirate of right knee only showed clotted bloody fluid likely secondary to a hematoma as per ortho surg. Pt did receive IV vanco, cefepime while inpatient and was d/c w/ po cefadroxil until 09/22/20 as per ID. Pt's fever and WBC had normalized prior to d/c. For more information, please see previous progress/consult notes.   Discharge Diagnoses:  Active Problems:   Sepsis ([place])  Sepsis: met criteria w/ fever, leukocytosis & likely infected right knee w/ recent hx of right total knee arthroplasty. Continue on IV vanco, cefepime as per ID.  [place] of b/l LE was neg for DVTs. Resolved  Right knee pain: w/ likely hematoma. Aspirate of right knee showed only clotted bloody fluid as per ortho surg. Continue on IV vanco, cefepime while inpatient as per ID. Blood cxs NGTD.  Recent total right knee arthroplasty. Ortho surg following and recs apprec  Normocytic anemia: H&H are stable. No need for a transfusion currently   Leukocytosis: resolved   AKI: Cr was labile. Avoid nephrotoxic meds   Hypokalemia: WNL today  DM2: likely poorly controlled. Continue on SSI w/ accuchecks   Hypothyroidism: continue on levothyroxine   Asthma: unknown severity and/or stage. W/o exacerbation. Continue on bronchodilators   Morbid obesity: BMI 42.5. Complicates overall care & prognosis    Discharge Instructions  Discharge Instructions  Diet - low sodium heart healthy   Complete by: As directed    Diet Carb Modified   Complete by: As directed    Discharge instructions   Complete by: As directed    F/u w/ ortho surg in 1 week. F/u w/ PCP in 1-2 weeks   Increase activity slowly   Complete by: As directed    No wound care   Complete by: As directed        Allergies as of 09/19/2020       Reactions   Cinnamon    "not good"   Penicillins Hives, Rash   Received ceftriaxone in 2019        Medication List     STOP taking these medications    sulfamethoxazole-trimethoprim 800-160 MG tablet Commonly known as: BACTRIM DS       TAKE these medications    acetaminophen 325 MG tablet Commonly known as: TYLENOL Take 1-2 tablets (325-650 mg total) by mouth every 6 (six) hours as needed for mild pain (pain score 1-3 or temp > 100.5).   albuterol 108 (90 Base) MCG/ACT inhaler Commonly known as: VENTOLIN HFA Inhale 1-2 puffs into the lungs every 6 (six) hours as needed for wheezing or shortness of breath.   aspirin EC 325 MG tablet Take 1 tablet (325 mg total) by mouth in the morning and at bedtime.   cefadroxil 500 MG capsule Commonly known as: DURICEF Take 1 capsule (500 mg total) by mouth 2 (two) times daily for 3 days.   cholecalciferol 1000 units tablet Commonly known as: VITAMIN D Take 1,000 Units by mouth daily.   enoxaparin 40 MG/0.4ML injection Commonly known as: LOVENOX Inject 0.4 mLs (40 mg total) into the skin daily.   Fluticasone-Salmeterol 500-50 MCG/DOSE Aepb Commonly known as: ADVAIR Inhale 1 puff into the lungs daily as needed (shortness of breath or wheezing).   hydrochlorothiazide 25 MG tablet Commonly known as: HYDRODIURIL Take 25 mg by mouth daily.   levothyroxine 150 MCG tablet Commonly known as: SYNTHROID Take 150 mcg by mouth daily before breakfast.   losartan 50 MG tablet Commonly known as: COZAAR Take 50 mg by mouth daily.   metFORMIN 500 MG tablet Commonly known as: GLUCOPHAGE Take 500 mg by mouth daily with breakfast.   montelukast 10 MG tablet Commonly known as: SINGULAIR Take 10 mg by mouth at bedtime.   oxyCODONE 5 MG immediate release tablet Commonly known as: Oxy IR/ROXICODONE Take 1-2 tablets (5-10 mg total) by mouth every 4 (four) hours as needed for moderate pain (pain score  4-6).   pantoprazole 40 MG tablet Commonly known as: PROTONIX Take 1 tablet (40 mg total) by mouth daily.   potassium chloride 10 MEQ tablet Commonly known as: KLOR-CON Take 10 mEq by mouth daily.        Allergies  Allergen Reactions   Cinnamon     "not good"   Penicillins Hives and Rash    Received ceftriaxone in 2019    Consultations: ID Ortho surg, Dr. Mack Guise    Procedures/Studies: DG Chest 2 View  Result Date: 09/15/2020 CLINICAL DATA:  Generalized weakness. Recent right knee replacement. EXAM: CHEST - 2 VIEW COMPARISON:  September 04, 2020 FINDINGS: The heart size and mediastinal contours are within normal limits. Both lungs are clear. The visualized skeletal structures are unremarkable. IMPRESSION: No active cardiopulmonary disease. Electronically Signed   By: Virgina Norfolk M.D.   On: 09/15/2020 23:42   MR BRAIN WO CONTRAST  Result Date: 09/03/2020  CLINICAL DATA:  Dizziness. EXAM: MRI HEAD WITHOUT CONTRAST TECHNIQUE: Multiplanar, multiecho pulse sequences of the brain and surrounding structures were obtained without intravenous contrast. COMPARISON:  None. FINDINGS: Intermittently motion limited study.  Within this limitation: Brain: No acute infarction, hemorrhage, hydrocephalus, extra-axial collection or mass lesion. Vascular: Major arterial flow voids are maintained at the skull base. Skull and upper cervical spine: Normal marrow signal. Sinuses/Orbits: Mild ethmoid air cell mucosal thickening. Otherwise, clear sinuses. Unremarkable orbits. Other: No mastoid effusions. IMPRESSION: No evidence of acute intracranial abnormality on this motion limited study. Electronically Signed   By: Margaretha Sheffield MD   On: 09/03/2020 15:27   CT ABDOMEN PELVIS W CONTRAST  Result Date: 09/17/2020 CLINICAL DATA:  Sepsis, generalized abdominal pain, concern for urinary pathology EXAM: CT ABDOMEN AND PELVIS WITH CONTRAST TECHNIQUE: Multidetector CT imaging of the abdomen and pelvis was  performed using the standard protocol following bolus administration of intravenous contrast. CONTRAST:  144m OMNIPAQUE IOHEXOL 300 MG/ML  SOLN COMPARISON:  None. FINDINGS: Lower chest: Some atelectatic changes seen dependently in the lung bases. Normal heart size. No pericardial effusion. Hepatobiliary: No worrisome focal liver lesions. Smooth liver surface contour. Normal hepatic attenuation. Normal gallbladder and biliary tree. Pancreas: No pancreatic ductal dilatation or surrounding inflammatory changes. Spleen: Normal in size. No concerning splenic lesions. Adrenals/Urinary Tract: Normal adrenal glands. Low positioning of the right kidney with under rotation with the renal pelvis directed anteriorly. Under rotation of the left kidney noted as well. Bilateral extrarenal pelves are present. There is asymmetrically increased left perinephric stranding. A slightly delayed left nephrogram is noted on excretory delayed phase imaging as well. Mild urothelial thickening on the left is present. Few scattered punctate nonobstructing calculi measuring up to 3 mm are seen in the upper and lower pole left kidney. No obstructive urolithiasis or frank hydronephrosis is seen. No visible distal ureteral calculi. Urinary bladder is unremarkable for the degree of distention. Stomach/Bowel: Distal esophagus, stomach and duodenum are unremarkable. No small bowel thickening or dilatation. Noninflamed appendix in the right lower quadrant moderate colonic stool burden. No colonic dilatation or wall thickening. Vascular/Lymphatic: Atherosclerotic calcifications within the abdominal aorta and branch vessels. No aneurysm or ectasia. No enlarged abdominopelvic lymph nodes. Reproductive: Anteverted uterus displaced slightly leftward. Suspect an ill-defined anterior fundal fibroid with lobular fundal contours. No concerning adnexal masses or lesions. Other: No free abdominopelvic air or fluid. Tiny fat containing umbilical hernia.  Subcutaneous soft tissue gas along the anterior abdominal wall, likely related to injectable use. Mild body wall edema most pronounced towards the flanks. Musculoskeletal: Multilevel degenerative changes are present in the imaged portions of the spine. No acute osseous abnormality or suspicious osseous lesion. IMPRESSION: 1. Mild asymmetric left perinephric stranding and urothelial thickening with slightly delayed left renal nephrogram. No frank hydronephrosis or obstructing calculus is seen at this time. Findings could reflect developing pyelonephritis/ascending tract infection or a recently passed calculus. Correlate with urinalysis. 2. Additional punctate nonobstructing calculi present in the upper and lower pole left kidney. 3. Low positioning of the right kidney with bilateral renal under rotation and extrarenal pelves, anatomic variant. 4.  Aortic Atherosclerosis (ICD10-I70.0). 5. Subcutaneous gas along the left anterior abdominal wall, correlate for injectable use. 6. Suspect fundal uterine fibroid, a poorly visualized on this exam. Consider outpatient pelvic ultrasound as clinically warranted. Electronically Signed   By: PLovena LeM.D.   On: 09/17/2020 21:21   UKoreaVenous Img Lower Bilateral (DVT)  Result Date: 09/18/2020 CLINICAL DATA:  Bilateral lower extremity  pain and edema. History of right total knee replacement two weeks ago. Evaluate for DVT. EXAM: BILATERAL LOWER EXTREMITY VENOUS DOPPLER ULTRASOUND TECHNIQUE: Gray-scale sonography with graded compression, as well as color Doppler and duplex ultrasound were performed to evaluate the lower extremity deep venous systems from the level of the common femoral vein and including the common femoral, femoral, profunda femoral, popliteal and calf veins including the posterior tibial, peroneal and gastrocnemius veins when visible. The superficial great saphenous vein was also interrogated. Spectral Doppler was utilized to evaluate flow at rest and with  distal augmentation maneuvers in the common femoral, femoral and popliteal veins. COMPARISON:  None. FINDINGS: RIGHT LOWER EXTREMITY Common Femoral Vein: No evidence of thrombus. Normal compressibility, respiratory phasicity and response to augmentation. Saphenofemoral Junction: No evidence of thrombus. Normal compressibility and flow on color Doppler imaging. Profunda Femoral Vein: No evidence of thrombus. Normal compressibility and flow on color Doppler imaging. Femoral Vein: No evidence of thrombus. Normal compressibility, respiratory phasicity and response to augmentation. Popliteal Vein: No evidence of thrombus. Normal compressibility, respiratory phasicity and response to augmentation. Calf Veins: No evidence of thrombus. Normal compressibility and flow on color Doppler imaging. Superficial Great Saphenous Vein: No evidence of thrombus. Normal compressibility. Venous Reflux:  None. Other Findings:  None. LEFT LOWER EXTREMITY Common Femoral Vein: No evidence of thrombus. Normal compressibility, respiratory phasicity and response to augmentation. Saphenofemoral Junction: No evidence of thrombus. Normal compressibility and flow on color Doppler imaging. Profunda Femoral Vein: No evidence of thrombus. Normal compressibility and flow on color Doppler imaging. Femoral Vein: No evidence of thrombus. Normal compressibility, respiratory phasicity and response to augmentation. Popliteal Vein: No evidence of thrombus. Normal compressibility, respiratory phasicity and response to augmentation. Calf Veins: No evidence of thrombus. Normal compressibility and flow on color Doppler imaging. Superficial Great Saphenous Vein: No evidence of thrombus. Normal compressibility. Venous Reflux:  None. Other Findings:  None. IMPRESSION: No evidence of DVT within either lower extremity. Electronically Signed   By: Sandi Mariscal M.D.   On: 09/18/2020 09:37   DG Chest Port 1 View  Result Date: 09/04/2020 CLINICAL DATA:  Fever.   Shortness of breath. EXAM: PORTABLE CHEST 1 VIEW COMPARISON:  Aug 14, 2020. FINDINGS: Enlarged cardiac silhouette. Prominent right paratracheal stripe likely related to vascular structure accentuated by patient rotation. Pulmonary vascular congestion no consolidation. No visible pleural effusions or pneumothorax on this single AP radiograph. IMPRESSION: Cardiomegaly and pulmonary vascular congestion without overt pulmonary edema. Electronically Signed   By: Margaretha Sheffield MD   On: 09/04/2020 08:14   DG Knee Complete 4 Views Right  Result Date: 09/15/2020 CLINICAL DATA:  Recent right knee replacement with fever. EXAM: RIGHT KNEE - COMPLETE 4+ VIEW COMPARISON:  September 02, 2020 FINDINGS: A right knee replacement is seen without evidence of surrounding lucency to suggest the presence of hardware loosening or infection. No evidence of an acute fracture or dislocation. A moderate to large joint effusion is seen. IMPRESSION: 1. Intact right knee replacement. 2. Moderate to large joint effusion. Electronically Signed   By: Virgina Norfolk M.D.   On: 09/15/2020 23:43   DG Knee Right Port  Result Date: 09/02/2020 CLINICAL DATA:  Status post right knee replacement today. EXAM: PORTABLE RIGHT KNEE - 1-2 VIEW COMPARISON:  None. FINDINGS: Total arthroplasty is in place. No hardware complication or bony abnormality is seen. Gas in the soft tissues and surgical staples noted. IMPRESSION: Status post right knee replacement.  No acute finding. Electronically Signed   By: Marcello Moores  Dalessio M.D.   On: 09/02/2020 12:45      Subjective: Pt denies any complaints    Discharge Exam: Vitals:   09/19/20 0839 09/19/20 1210  BP: 122/90 124/68  Pulse: 72 85  Resp: 18 18  Temp: 97.8 F (36.6 C) 98.2 F (36.8 C)  SpO2: 99% 97%   Vitals:   09/18/20 1946 09/19/20 0455 09/19/20 0839 09/19/20 1210  BP: 101/86 120/72 122/90 124/68  Pulse: 78 73 72 85  Resp: _0 Temp: 98.1 F (36.7 C) 98.1 F (36.7 C) 97.8 F  (36.6 C) 98.2 F (36.8 C)  TempSrc: Oral Oral Oral   SpO2: 96% 96% 99% 97%  Weight:      Height:        General: Pt is alert, awake, not in acute distress Cardiovascular: S1/S2 +, no rubs, no gallops Respiratory: CTA bilaterally, no wheezing, no rhonchi Abdominal: Soft, NT, obese, bowel sounds + Extremities: no cyanosis    The results of significant diagnostics from this hospitalization (including imaging, microbiology, ancillary and laboratory) are listed below for reference.     Microbiology: Recent Results (from the past 240 hour(s))  Resp Panel by RT-PCR (Flu A&B, Covid) Nasopharyngeal Swab     Status: None   Collection Time: 09/15/20 11:48 PM   Specimen: Nasopharyngeal Swab; Nasopharyngeal(NP) swabs in vial transport medium  Result Value Ref Range Status   SARS Coronavirus 2 by RT PCR NEGATIVE NEGATIVE Final    Comment: (NOTE) SARS-CoV-2 target nucleic acids are NOT DETECTED.  The SARS-CoV-2 RNA is generally detectable in upper respiratory specimens during the acute phase of infection. The lowest concentration of SARS-CoV-2 viral copies this assay can detect is 138 copies/mL. A negative result does not preclude SARS-Cov-2 infection and should not be used as the sole basis for treatment or other patient management decisions. A negative result may occur with  improper specimen collection/handling, submission of specimen other than nasopharyngeal swab, presence of viral mutation(s) within the areas targeted by this assay, and inadequate number of viral copies(<138 copies/mL). A negative result must be combined with clinical observations, patient history, and epidemiological information. The expected result is Negative.  Fact Sheet for Patients:  EntrepreneurPulse.com.au  Fact Sheet for Healthcare Providers:  IncredibleEmployment.be  This test is no t yet approved or cleared by the [place] FDA and  has been authorized for  detection and/or diagnosis of SARS-CoV-2 by FDA under an Emergency Use Authorization (EUA). This EUA will remain  in effect (meaning this test can be used) for the duration of the COVID-19 declaration under Section 564(b)(1) of the Act, 21 U.S.C.section 360bbb-3(b)(1), unless the authorization is terminated  or revoked sooner.       Influenza A by PCR NEGATIVE NEGATIVE Final   Influenza B by PCR NEGATIVE NEGATIVE Final    Comment: (NOTE) The Xpert Xpress SARS-CoV-2/FLU/RSV plus assay is intended as an aid in the diagnosis of influenza from Nasopharyngeal swab specimens and should not be used as a sole basis for treatment. Nasal washings and aspirates are unacceptable for Xpert Xpress SARS-CoV-2/FLU/RSV testing.  Fact Sheet for Patients: EntrepreneurPulse.com.au  Fact Sheet for Healthcare Providers: IncredibleEmployment.be  This test is not yet approved or cleared by the [place] FDA and has been authorized for detection and/or diagnosis of SARS-CoV-2 by FDA under an Emergency Use Authorization (EUA). This EUA will remain in effect (meaning this test can be used) for the duration of the COVID-19 declaration under Section 564(b)(1) of the Act, 21 U.S.C.  section 360bbb-3(b)(1), unless the authorization is terminated or revoked.  Performed at [facility], [address] [place], [place] [address]   Culture, blood (routine x 2)     Status: None (Preliminary result)   Collection Time: 09/15/20 11:48 PM   Specimen: BLOOD  Result Value Ref Range Status   Specimen Description BLOOD BLOOD LEFT HAND  Final   Special Requests   Final    BOTTLES DRAWN AEROBIC ONLY Blood Culture results may not be optimal due to an inadequate volume of blood received in culture bottles   Culture   Final    NO GROWTH 3 DAYS Performed at [facility], [address], [place], [place] [address]    Report Status PENDING  Incomplete  Culture,  blood (routine x 2)     Status: None (Preliminary result)   Collection Time: 09/15/20 11:48 PM   Specimen: BLOOD  Result Value Ref Range Status   Specimen Description BLOOD RIGHT ANTECUBITAL  Final   Special Requests   Final    BOTTLES DRAWN AEROBIC AND ANAEROBIC Blood Culture adequate volume   Culture   Final    NO GROWTH 3 DAYS Performed at [facility], [address] [place], [place] [address]    Report Status PENDING  Incomplete  Urine culture     Status: None   Collection Time: 09/15/20 11:48 PM   Specimen: Urine, Random  Result Value Ref Range Status   Specimen Description   Final    URINE, RANDOM Performed at [facility], [address] [place], [place] [address]    Special Requests   Final    NONE Performed at [facility], [address], [place], [place] [address]    Culture   Final    NO GROWTH Performed at [facility], [place] [address] [place],  [address]    Report Status 09/17/2020 FINAL  Final     Labs: BNP (last 3 results) Recent Labs    09/03/20 0419  BNP 678.9*   Basic Metabolic Panel: Recent Labs  Lab 09/15/20 2305 09/16/20 0700 09/17/20 0716 09/18/20 0415 09/19/20 0424  NA 136 136 137 138 141  K 2.9* 4.3 3.5 3.7 3.6  CL 106 102 105 106 106  CO2 21* _0 GLUCOSE 134* 122* 98 87 93  BUN 30* 34* 23* 15 19  CREATININE 1.45* 1.99* 1.35* 1.06* 1.08*  CALCIUM 7.7* 8.2* 8.3* 8.2* 8.2*   Liver Function Tests: Recent Labs  Lab 09/15/20 2305  AST 29  ALT 20  ALKPHOS 88  BILITOT 0.7  PROT 7.6  ALBUMIN 2.9*   Recent Labs  Lab 09/15/20 2305  LIPASE 28   No results for input(s): AMMONIA in the last 168 hours. CBC: Recent Labs  Lab 09/15/20 2305 09/16/20 0700 09/17/20 1428 09/18/20 0415 09/19/20 0424  WBC 19.2* 22.2* 7.5 6.2 6.1  NEUTROABS 18.1*  --   --   --   --   HGB 10.8* 9.5* 8.7* 8.8* 9.0*  HCT 33.7* 29.5* 27.2* 27.7* 29.2*  MCV 90.1 90.8 89.5 91.1 93.0  PLT 440* 447*  344 337 331   Cardiac Enzymes: No results for input(s): CKTOTAL, CKMB, CKMBINDEX, TROPONINI in the last 168 hours. BNP: Invalid input(s): POCBNP CBG: Recent Labs  Lab 09/18/20 1129 09/18/20 1620 09/18/20 2113 09/19/20 0836 09/19/20 1213  GLUCAP 106* 88 123* 112* 111*   D-Dimer No results for input(s): DDIMER in the last 72 hours. Hgb  A1c No results for input(s): HGBA1C in the last 72 hours. Lipid Profile No results for input(s): CHOL, HDL, LDLCALC, TRIG, CHOLHDL, LDLDIRECT in the last 72 hours. Thyroid function studies Recent Labs    09/17/20 0716  TSH 0.648   Anemia work up No results for input(s): VITAMINB12, FOLATE, FERRITIN, TIBC, IRON, RETICCTPCT in the last 72 hours. Urinalysis    Component Value Date/Time   COLORURINE YELLOW (A) 09/15/2020 2348   APPEARANCEUR HAZY (A) 09/15/2020 2348   LABSPEC 1.011 09/15/2020 2348   PHURINE 5.0 09/15/2020 2348   GLUCOSEU NEGATIVE 09/15/2020 2348   HGBUR MODERATE (A) 09/15/2020 2348   BILIRUBINUR NEGATIVE 09/15/2020 2348   KETONESUR NEGATIVE 09/15/2020 2348   PROTEINUR NEGATIVE 09/15/2020 2348   UROBILINOGEN 0.2 09/10/2012 1315   NITRITE NEGATIVE 09/15/2020 2348   LEUKOCYTESUR NEGATIVE 09/15/2020 2348   Sepsis Labs Invalid input(s): PROCALCITONIN,  WBC,  LACTICIDVEN Microbiology Recent Results (from the past 240 hour(s))  Resp Panel by RT-PCR (Flu A&B, Covid) Nasopharyngeal Swab     Status: None   Collection Time: 09/15/20 11:48 PM   Specimen: Nasopharyngeal Swab; Nasopharyngeal(NP) swabs in vial transport medium  Result Value Ref Range Status   SARS Coronavirus 2 by RT PCR NEGATIVE NEGATIVE Final    Comment: (NOTE) SARS-CoV-2 target nucleic acids are NOT DETECTED.  The SARS-CoV-2 RNA is generally detectable in upper respiratory specimens during the acute phase of infection. The lowest concentration of SARS-CoV-2 viral copies this assay can detect is 138 copies/mL. A negative result does not preclude  SARS-Cov-2 infection and should not be used as the sole basis for treatment or other patient management decisions. A negative result may occur with  improper specimen collection/handling, submission of specimen other than nasopharyngeal swab, presence of viral mutation(s) within the areas targeted by this assay, and inadequate number of viral copies(<138 copies/mL). A negative result must be combined with clinical observations, patient history, and epidemiological information. The expected result is Negative.  Fact Sheet for Patients:  EntrepreneurPulse.com.au  Fact Sheet for Healthcare Providers:  IncredibleEmployment.be  This test is no t yet approved or cleared by the [place] FDA and  has been authorized for detection and/or diagnosis of SARS-CoV-2 by FDA under an Emergency Use Authorization (EUA). This EUA will remain  in effect (meaning this test can be used) for the duration of the COVID-19 declaration under Section 564(b)(1) of the Act, 21 U.S.C.section 360bbb-3(b)(1), unless the authorization is terminated  or revoked sooner.       Influenza A by PCR NEGATIVE NEGATIVE Final   Influenza B by PCR NEGATIVE NEGATIVE Final    Comment: (NOTE) The Xpert Xpress SARS-CoV-2/FLU/RSV plus assay is intended as an aid in the diagnosis of influenza from Nasopharyngeal swab specimens and should not be used as a sole basis for treatment. Nasal washings and aspirates are unacceptable for Xpert Xpress SARS-CoV-2/FLU/RSV testing.  Fact Sheet for Patients: EntrepreneurPulse.com.au  Fact Sheet for Healthcare Providers: IncredibleEmployment.be  This test is not yet approved or cleared by the [place] FDA and has been authorized for detection and/or diagnosis of SARS-CoV-2 by FDA under an Emergency Use Authorization (EUA). This EUA will remain in effect (meaning this test can be used) for the duration of  the COVID-19 declaration under Section 564(b)(1) of the Act, 21 U.S.C. section 360bbb-3(b)(1), unless the authorization is terminated or revoked.  Performed at [facility], [address] [place], [place] [address]   Culture, blood (routine x 2)     Status: None (Preliminary  result)   Collection Time: 09/15/20 11:48 PM   Specimen: BLOOD  Result Value Ref Range Status   Specimen Description BLOOD BLOOD LEFT HAND  Final   Special Requests   Final    BOTTLES DRAWN AEROBIC ONLY Blood Culture results may not be optimal due to an inadequate volume of blood received in culture bottles   Culture   Final    NO GROWTH 3 DAYS Performed at [facility], [address] [place], [place] [address]    Report Status PENDING  Incomplete  Culture, blood (routine x 2)     Status: None (Preliminary result)   Collection Time: 09/15/20 11:48 PM   Specimen: BLOOD  Result Value Ref Range Status   Specimen Description BLOOD RIGHT ANTECUBITAL  Final   Special Requests   Final    BOTTLES DRAWN AEROBIC AND ANAEROBIC Blood Culture adequate volume   Culture   Final    NO GROWTH 3 DAYS Performed at [facility], [address], [place], [place] [address]    Report Status PENDING  Incomplete  Urine culture     Status: None   Collection Time: 09/15/20 11:48 PM   Specimen: Urine, Random  Result Value Ref Range Status   Specimen Description   Final    URINE, RANDOM Performed at [facility], [address], [place], [place] [address]    Special Requests   Final    NONE Performed at [facility], [address], [place], [place] [address]    Culture   Final    NO GROWTH Performed at [facility], [place] [address], [place],  [address]    Report Status 09/17/2020 FINAL  Final     Time coordinating discharge: Over 30 minutes  SIGNED:   Wyvonnia Dusky, MD  [facility] 09/19/2020, 1:31 PM Pager   If 7PM-7AM, please contact  night-coverage [email or web address]

## 2020-09-19 NOTE — Progress Notes (Signed)
ID Pt doing well No fever in 48 hrs No pain leg Dr.Krasinksi at bed side looking at the surgical site Patient Vitals for the past 24 hrs:  BP Temp Temp src Pulse Resp SpO2  09/19/20 0839 122/90 97.8 F (36.6 C) Oral 72 18 99 %  09/19/20 0455 120/72 98.1 F (36.7 C) Oral 73 17 96 %  09/18/20 1946 101/86 98.1 F (36.7 C) Oral 78 17 96 %  09/18/20 1617 114/74 98.3 F (36.8 C) Oral 74 17 100 %  09/18/20 1126 122/72 99.7 F (37.6 C) -- 78 17 93 %    Awake and alert Chest CTA HS s1s2 Abd  soft CNS non focal Rt knee SS- covered with dressing Rt leg swelling minimal  Labs CBC Latest Ref Rng & Units 09/19/2020 09/18/2020 09/17/2020  WBC 4.0 - 10.5 K/uL 6.1 6.2 7.5  Hemoglobin 12.0 - 15.0 g/dL 9.0(L) 8.8(L) 8.7(L)  Hematocrit 36.0 - 46.0 % 29.2(L) 27.7(L) 27.2(L)  Platelets 150 - 400 K/uL 331 337 344    CMP Latest Ref Rng & Units 09/19/2020 09/18/2020 09/17/2020  Glucose 70 - 99 mg/dL 93 87 98  BUN 6 - 20 mg/dL 19 15 23(H)  Creatinine 0.44 - 1.00 mg/dL 1.08(H) 1.06(H) 1.35(H)  Sodium 135 - 145 mmol/L 141 138 137  Potassium 3.5 - 5.1 mmol/L 3.6 3.7 3.5  Chloride 98 - 111 mmol/L 106 106 105  CO2 22 - 32 mmol/L 28 27 26   Calcium 8.9 - 10.3 mg/dL 8.2(L) 8.2(L) 8.3(L)  Total Protein 6.5 - 8.1 g/dL - - -  Total Bilirubin 0.3 - 1.2 mg/dL - - -  Alkaline Phos 38 - 126 U/L - - -  AST 15 - 41 U/L - - -  ALT 0 - 44 U/L - - -    Micro BC ng UC ng  Ct abdomen Mild asymmetric left perinephric stranding and urothelial thickening with slightly delayed left renal nephrogram. No frank hydronephrosis or obstructing calculus is seen at this time. 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 Other possibility is trasnient bacteremia following lovenox injection as her symptoms started immediately after  Antibiotics de-escalated to 1st gen cephalosporin as no feverin 48 hrs and WBC normalized She can be discharged on cefadrolix for 5 more days and follow up with Dr.Krasinski Discussed the management with patient and care team

## 2020-09-19 NOTE — Progress Notes (Signed)
ID Pt is feeling better No fever Worked with PT Increasing swelling rt leg with pain rt knee Ct abd done yesterday showed lt renal possible pyelo  O/E awake and alert BP 101/86 (BP Location: Left Arm)   Pulse 78   Temp 98.1 F (36.7 C) (Oral)   Resp 17   Ht 5\' 6"  (1.676 m)   Wt 119.7 kg   LMP 10/03/2015 (Approximate)   SpO2 96%   BMI 42.59 kg/m   Chest b/l air entry Hss1s2 Abd soft Rt knee wrapped  Swelling of rt foot No erythema Some warmth  Labs CBC Latest Ref Rng & Units 09/18/2020 09/17/2020 09/16/2020  WBC 4.0 - 10.5 K/uL 6.2 7.5 22.2(H)  Hemoglobin 12.0 - 15.0 g/dL 8.8(L) 8.7(L) 9.5(L)  Hematocrit 36.0 - 46.0 % 27.7(L) 27.2(L) 29.5(L)  Platelets 150 - 400 K/uL 337 344 447(H)     CMP Latest Ref Rng & Units 09/18/2020 09/17/2020 09/16/2020  Glucose 70 - 99 mg/dL 87 98 122(H)  BUN 6 - 20 mg/dL 15 23(H) 34(H)  Creatinine 0.44 - 1.00 mg/dL 1.06(H) 1.35(H) 1.99(H)  Sodium 135 - 145 mmol/L 138 137 136  Potassium 3.5 - 5.1 mmol/L 3.7 3.5 4.3  Chloride 98 - 111 mmol/L 106 105 102  CO2 22 - 32 mmol/L 27 26 25   Calcium 8.9 - 10.3 mg/dL 8.2(L) 8.3(L) 8.2(L)  Total Protein 6.5 - 8.1 g/dL - - -  Total Bilirubin 0.3 - 1.2 mg/dL - - -  Alkaline Phos 38 - 126 U/L - - -  AST 15 - 41 U/L - - -  ALT 0 - 44 U/L - - -     Impression/recommendation Fever and leucocytosis likely could be related to left renal source- pyelonephritis . Bc/UC negative. H/o left rena; calculi surgically removed in 2021  on vanco/cefepime- de-escalate to cefazolin D.D rt leg- Dr.Krasinski does not think the rt knee is involved Fever and leucocytosis resolved De-escalate antibiotics vanco/cefepime  to cefazolin so that on discharge she could go on PO cefadroxil for a total of 7 days - 09/22/20  Recent TKA- follow closely the surgical site Htn - was on HCTZ, lisinopril and amlodipine at home   Discussed the management with patient in detail

## 2020-09-19 NOTE — Progress Notes (Signed)
Discharge instructions explained/Pt verbalized understanding. IV and tele removed. Will transport off unit via wheelchair when ride arrives.

## 2020-09-19 NOTE — Progress Notes (Signed)
Subjective:  Patient feeling well today.  She denies any significant knee pain or other complaints including shortness of breath, fevers, chills.    Objective:   VITALS:   Vitals:   09/18/20 1946 09/19/20 0455 09/19/20 0839 09/19/20 1210  BP: 101/86 120/72 122/90 124/68  Pulse: 78 73 72 85  Resp: 17 17 18 18   Temp: 98.1 F (36.7 C) 98.1 F (36.7 C) 97.8 F (36.6 C) 98.2 F (36.8 C)  TempSrc: Oral Oral Oral   SpO2: 96% 96% 99% 97%  Weight:      Height:        PHYSICAL EXAM: Right lower extremity: I personally change the patient's dressings today.  There is no erythema or drainage from the incision. Neurovascular intact Sensation intact distally Intact pulses distally Dorsiflexion/Plantar flexion intact Incision: no drainage No cellulitis present Compartment soft  LABS  Results for orders placed or performed during the hospital encounter of 09/15/20 (from the past 24 hour(s))  Glucose, capillary     Status: None   Collection Time: 09/18/20  4:20 PM  Result Value Ref Range   Glucose-Capillary 88 70 - 99 mg/dL  Glucose, capillary     Status: Abnormal   Collection Time: 09/18/20  9:13 PM  Result Value Ref Range   Glucose-Capillary 123 (H) 70 - 99 mg/dL  CBC     Status: Abnormal   Collection Time: 09/19/20  4:24 AM  Result Value Ref Range   WBC 6.1 4.0 - 10.5 K/uL   RBC 3.14 (L) 3.87 - 5.11 MIL/uL   Hemoglobin 9.0 (L) 12.0 - 15.0 g/dL   HCT 29.2 (L) 36.0 - 46.0 %   MCV 93.0 80.0 - 100.0 fL   MCH 28.7 26.0 - 34.0 pg   MCHC 30.8 30.0 - 36.0 g/dL   RDW 15.0 11.5 - 15.5 %   Platelets 331 150 - 400 K/uL   nRBC 0.0 0.0 - 0.2 %  Basic metabolic panel     Status: Abnormal   Collection Time: 09/19/20  4:24 AM  Result Value Ref Range   Sodium 141 135 - 145 mmol/L   Potassium 3.6 3.5 - 5.1 mmol/L   Chloride 106 98 - 111 mmol/L   CO2 28 22 - 32 mmol/L   Glucose, Bld 93 70 - 99 mg/dL   BUN 19 6 - 20 mg/dL   Creatinine, Ser 1.08 (H) 0.44 - 1.00 mg/dL   Calcium 8.2  (L) 8.9 - 10.3 mg/dL   GFR, Estimated 59 (L) >60 mL/min   Anion gap 7 5 - 15  Glucose, capillary     Status: Abnormal   Collection Time: 09/19/20  8:36 AM  Result Value Ref Range   Glucose-Capillary 112 (H) 70 - 99 mg/dL  Glucose, capillary     Status: Abnormal   Collection Time: 09/19/20 12:13 PM  Result Value Ref Range   Glucose-Capillary 111 (H) 70 - 99 mg/dL    CT ABDOMEN PELVIS W CONTRAST  Result Date: 09/17/2020 CLINICAL DATA:  Sepsis, generalized abdominal pain, concern for urinary pathology EXAM: CT ABDOMEN AND PELVIS WITH CONTRAST TECHNIQUE: Multidetector CT imaging of the abdomen and pelvis was performed using the standard protocol following bolus administration of intravenous contrast. CONTRAST:  147mL OMNIPAQUE IOHEXOL 300 MG/ML  SOLN COMPARISON:  None. FINDINGS: Lower chest: Some atelectatic changes seen dependently in the lung bases. Normal heart size. No pericardial effusion. Hepatobiliary: No worrisome focal liver lesions. Smooth liver surface contour. Normal hepatic attenuation. Normal gallbladder and biliary tree. Pancreas:  No pancreatic ductal dilatation or surrounding inflammatory changes. Spleen: Normal in size. No concerning splenic lesions. Adrenals/Urinary Tract: Normal adrenal glands. Low positioning of the right kidney with under rotation with the renal pelvis directed anteriorly. Under rotation of the left kidney noted as well. Bilateral extrarenal pelves are present. There is asymmetrically increased left perinephric stranding. A slightly delayed left nephrogram is noted on excretory delayed phase imaging as well. Mild urothelial thickening on the left is present. Few scattered punctate nonobstructing calculi measuring up to 3 mm are seen in the upper and lower pole left kidney. No obstructive urolithiasis or frank hydronephrosis is seen. No visible distal ureteral calculi. Urinary bladder is unremarkable for the degree of distention. Stomach/Bowel: Distal esophagus,  stomach and duodenum are unremarkable. No small bowel thickening or dilatation. Noninflamed appendix in the right lower quadrant moderate colonic stool burden. No colonic dilatation or wall thickening. Vascular/Lymphatic: Atherosclerotic calcifications within the abdominal aorta and branch vessels. No aneurysm or ectasia. No enlarged abdominopelvic lymph nodes. Reproductive: Anteverted uterus displaced slightly leftward. Suspect an ill-defined anterior fundal fibroid with lobular fundal contours. No concerning adnexal masses or lesions. Other: No free abdominopelvic air or fluid. Tiny fat containing umbilical hernia. Subcutaneous soft tissue gas along the anterior abdominal wall, likely related to injectable use. Mild body wall edema most pronounced towards the flanks. Musculoskeletal: Multilevel degenerative changes are present in the imaged portions of the spine. No acute osseous abnormality or suspicious osseous lesion. IMPRESSION: 1. Mild asymmetric left perinephric stranding and urothelial thickening with slightly delayed left renal nephrogram. No frank hydronephrosis or obstructing calculus is seen at this time. Findings could reflect developing pyelonephritis/ascending tract infection or a recently passed calculus. Correlate with urinalysis. 2. Additional punctate nonobstructing calculi present in the upper and lower pole left kidney. 3. Low positioning of the right kidney with bilateral renal under rotation and extrarenal pelves, anatomic variant. 4.  Aortic Atherosclerosis (ICD10-I70.0). 5. Subcutaneous gas along the left anterior abdominal wall, correlate for injectable use. 6. Suspect fundal uterine fibroid, a poorly visualized on this exam. Consider outpatient pelvic ultrasound as clinically warranted. Electronically Signed   By: Lovena Le M.D.   On: 09/17/2020 21:21   US Venous Img Lower Bilateral (DVT)  Result Date: 09/18/2020 CLINICAL DATA:  Bilateral lower extremity pain and edema. History of  right total knee replacement two weeks ago. Evaluate for DVT. EXAM: BILATERAL LOWER EXTREMITY VENOUS DOPPLER ULTRASOUND TECHNIQUE: Gray-scale sonography with graded compression, as well as color Doppler and duplex ultrasound were performed to evaluate the lower extremity deep venous systems from the level of the common femoral vein and including the common femoral, femoral, profunda femoral, popliteal and calf veins including the posterior tibial, peroneal and gastrocnemius veins when visible. The superficial great saphenous vein was also interrogated. Spectral Doppler was utilized to evaluate flow at rest and with distal augmentation maneuvers in the common femoral, femoral and popliteal veins. COMPARISON:  None. FINDINGS: RIGHT LOWER EXTREMITY Common Femoral Vein: No evidence of thrombus. Normal compressibility, respiratory phasicity and response to augmentation. Saphenofemoral Junction: No evidence of thrombus. Normal compressibility and flow on color Doppler imaging. Profunda Femoral Vein: No evidence of thrombus. Normal compressibility and flow on color Doppler imaging. Femoral Vein: No evidence of thrombus. Normal compressibility, respiratory phasicity and response to augmentation. Popliteal Vein: No evidence of thrombus. Normal compressibility, respiratory phasicity and response to augmentation. Calf Veins: No evidence of thrombus. Normal compressibility and flow on color Doppler imaging. Superficial Great Saphenous Vein: No evidence of thrombus. Normal  compressibility. Venous Reflux:  None. Other Findings:  None. LEFT LOWER EXTREMITY Common Femoral Vein: No evidence of thrombus. Normal compressibility, respiratory phasicity and response to augmentation. Saphenofemoral Junction: No evidence of thrombus. Normal compressibility and flow on color Doppler imaging. Profunda Femoral Vein: No evidence of thrombus. Normal compressibility and flow on color Doppler imaging. Femoral Vein: No evidence of thrombus. Normal  compressibility, respiratory phasicity and response to augmentation. Popliteal Vein: No evidence of thrombus. Normal compressibility, respiratory phasicity and response to augmentation. Calf Veins: No evidence of thrombus. Normal compressibility and flow on color Doppler imaging. Superficial Great Saphenous Vein: No evidence of thrombus. Normal compressibility. Venous Reflux:  None. Other Findings:  None. IMPRESSION: No evidence of DVT within either lower extremity. Electronically Signed   By: Sandi Mariscal M.D.   On: 09/18/2020 09:37    Assessment/Plan:     Active Problems:   Sepsis [facility])  Patient is doing well from orthopedic standpoint.  She is making progress with physical therapy.  Patient will contact the office to set up outpatient physical therapy early next week.  In the meantime patient will work on home exercises.  Patient will restart Lovenox at home for DVT prophylaxis.  Ultrasound here as an inpatient showed no evidence of DVT.  Patient will continue to wait and use her Polar Care to reduce swelling.  Continue using TED stockings during the day and will remove them at night.  Patient will follow-up in my office in 1 to 2 weeks for wound check.  Patient will remain on oral antibiotics x5 days per ID.    [place] , MD 09/19/2020, 1:09 PM

## 2020-09-19 NOTE — Progress Notes (Signed)
Physical Therapy Treatment Patient Details Name: Katherine Estes MRN: [id number] DOB: 1960/06/21 Today's Date: 09/19/2020    History of Present Illness Patient is a 60 year old female who presents to the ED from home with a chief complaint of fever, nausea and generalized malaise.  Patient had right TKR 09/02/2020. Aspiration of the knee performed yielding one drop of blood. Large post-op hematoma in the knee suspected to be cause of swelling and ecchymosis. Patient being treated for sepsis.Bilateral doppler negative for DVT per report.    PT Comments    Pt seen for PT tx with pt only requiring min assist for supine>sit to support RLE to come to full upright sitting; pt does require extra time to complete mobility. Pt is able to complete transfers & gait with RW & supervision with PT providing education re: proper use of AD. Pt is very tearful during session, with pt reporting she's more emotional & anxious, feeling as though she should be doing better with recovery with PT providing listening, encouragement and support. Offered chaplain services but pt declines at this time.     Follow Up Recommendations  Home health PT     Equipment Recommendations  None recommended by PT    Recommendations for Other Services       Precautions / Restrictions Precautions Precautions: Knee;Fall Restrictions Weight Bearing Restrictions: Yes RLE Weight Bearing: Weight bearing as tolerated    Mobility  Bed Mobility Overal bed mobility: Needs Assistance Bed Mobility: Supine to Sit     Supine to sit: Min assist;HOB elevated     General bed mobility comments: min assist for RLE support to come to sitting EOB    Transfers Overall transfer level: Needs assistance Equipment used: Rolling walker (2 wheeled) Transfers: Sit to/from Stand Sit to Stand: Supervision            Ambulation/Gait Ambulation/Gait assistance: Supervision Gait Distance (Feet): 100 Feet Assistive device: Rolling  walker (2 wheeled) Gait Pattern/deviations: Decreased step length - right;Decreased step length - left;Decreased stride length Gait velocity: decreased   General Gait Details: cuing to push RW vs slightly lifting it up off floor to advance it   Stairs             Wheelchair Mobility    Modified Rankin (Stroke Patients Only)       Balance Overall balance assessment: Needs assistance Sitting-balance support: Feet supported Sitting balance-Leahy Scale: Good       Standing balance-Leahy Scale: Fair Standing balance comment: BUE support on RW                            Cognition Arousal/Alertness: Awake/alert Behavior During Therapy: WFL for tasks assessed/performed (emotional/tearful) Overall Cognitive Status: Within Functional Limits for tasks assessed                                 General Comments: Pt pleasant & agreeable to tx but emotional/tearful during session, PT provides encouragement, education & emotional support. Offered chaplain services but pt declines at this time.      Exercises      General Comments General comments (skin integrity, edema, etc.): Pt with continent void on toilet during session      Pertinent Vitals/Pain Pain Assessment: Faces Faces Pain Scale: Hurts a little bit Pain Location: R knee Pain Descriptors / Indicators: Tightness Pain Intervention(s): Monitored during session  Home Living                      Prior Function            PT Goals (current goals can now be found in the care plan section) Acute Rehab PT Goals Patient Stated Goal: to have more movement in right knee, to go home PT Goal Formulation: With patient Time For Goal Achievement: 10/02/20 Potential to Achieve Goals: Good Additional Goals Additional Goal #1: patient will increase right knee flexion to 90 degrees to maximize independence with functional mobility Progress towards PT goals: Progressing toward goals     Frequency    Min 2X/week      PT Plan Current plan remains appropriate    Co-evaluation              AM-PAC PT "6 Clicks" Mobility   Outcome Measure  Help needed turning from your back to your side while in a flat bed without using bedrails?: None Help needed moving from lying on your back to sitting on the side of a flat bed without using bedrails?: A Little Help needed moving to and from a bed to a chair (including a wheelchair)?: A Little Help needed standing up from a chair using your arms (e.g., wheelchair or bedside chair)?: A Little Help needed to walk in hospital room?: A Little Help needed climbing 3-5 steps with a railing? : A Little 6 Click Score: 19    End of Session Equipment Utilized During Treatment: Gait belt Activity Tolerance: Patient tolerated treatment well Patient left: with call bell/phone within reach;with bed alarm set (sitting EOB)   PT Visit Diagnosis: Other abnormalities of gait and mobility (R26.89);Muscle weakness (generalized) (M62.81) Pain - Right/Left: Right Pain - part of body: Knee     Time: 1134-1202 PT Time Calculation (min) (ACUTE ONLY): 28 min  Charges:  $Therapeutic Activity: 23-37 mins                     Lavone Nian, PT, DPT 09/19/20, 12:51 PM    Waunita Schooner 09/19/2020, 12:49 PM

## 2020-09-23 LAB — CULTURE, BLOOD (ROUTINE X 2)
Culture: NO GROWTH
Culture: NO GROWTH
Special Requests: ADEQUATE

## 2020-11-12 ENCOUNTER — Other Ambulatory Visit: Payer: Self-pay | Admitting: Orthopedic Surgery

## 2020-11-14 ENCOUNTER — Other Ambulatory Visit
Admission: RE | Admit: 2020-11-14 | Discharge: 2020-11-14 | Disposition: A | Discharge status: Home / Self Care | Payer: Medicaid Other | Source: Ambulatory Visit | Attending: Orthopedic Surgery | Admitting: Orthopedic Surgery

## 2020-11-14 NOTE — Pre-Procedure Instructions (Signed)
Patient reports that she was just seen by Dr. Mack Guise on 11/12/20 and medications and history were reviewed and updated, she is reporting to work/class at the time of this call and can not take call. This Probation officer referred her to her Mychart to get her instructions for her procedure. She recently had surgery and this procedure is a follow up procedure pertaining to same site.

## 2020-11-14 NOTE — Patient Instructions (Signed)
Your procedure is scheduled on: 11/18/20 Report to the Registration Desk on the 1st floor of the [place]. To find out your arrival time, please call [phone] between 1PM - 3PM on: 11/17/20   REMEMBER: Instructions that are not followed completely may result in serious medical risk, up to and including death; or upon the discretion of your surgeon and anesthesiologist your surgery may need to be rescheduled.  Do not eat food after midnight the night before surgery.  No gum chewing, lozengers or hard candies.  You may however, drink CLEAR liquids up to 2 hours before you are scheduled to arrive for your surgery. Do not drink anything within 2 hours of your scheduled arrival time, Type 1 and Type 2 diabetics should only drink water.  TAKE THESE MEDICATIONS THE MORNING OF SURGERY WITH A SIP OF WATER:  - levothyroxine (SYNTHROID) 150 MCG tablet - pantoprazole (PROTONIX) 40 MG tablet, (take one the night before and one on the morning of surgery - helps to prevent nausea after surgery.)  Use inhalers albuterol (VENTOLIN HFA) 108 (90 Base) MCG/ACT inhaler on the day of surgery and bring to the hospital.  Stop Metformin  2 days prior to surgery.  Follow recommendations from Cardiologist, Pulmonologist or PCP regarding stopping Aspirin, Coumadin, Plavix, Eliquis, Pradaxa, or Pletal.  One week prior to surgery: Stop Anti-inflammatories (NSAIDS) such as Advil, Aleve, Ibuprofen, Motrin, Naproxen, Naprosyn and Aspirin based products such as Excedrin, Goodys Powder, BC Powder.  Stop ANY OVER THE COUNTER supplements until after surgery.  You may however, continue to take Tylenol if needed for pain up until the day of surgery.  No Alcohol for 24 hours before or after surgery.  No Smoking including e-cigarettes for 24 hours prior to surgery.  No chewable tobacco products for at least 6 hours prior to surgery.  No nicotine patches on the day of surgery.  Do not use any "recreational"  drugs for at least a week prior to your surgery.  Please be advised that the combination of cocaine and anesthesia may have negative outcomes, up to and including death. If you test positive for cocaine, your surgery will be cancelled.  On the morning of surgery brush your teeth with toothpaste and water, you may rinse your mouth with mouthwash if you wish. Do not swallow any toothpaste or mouthwash.  Do not wear jewelry, make-up, hairpins, clips or nail polish.  Do not wear lotions, powders, or perfumes.   Do not shave body from the neck down 48 hours prior to surgery just in case you cut yourself which could leave a site for infection.  Also, freshly shaved skin may become irritated if using the CHG soap.  Contact lenses, hearing aids and dentures may not be worn into surgery.  Do not bring valuables to the hospital. [facility] is not responsible for any missing/lost belongings or valuables.   Use CHG Soap or wipes as directed on instruction sheet.  Notify your doctor if there is any change in your medical condition (cold, fever, infection).  Wear comfortable clothing (specific to your surgery type) to the hospital.  After surgery, you can help prevent lung complications by doing breathing exercises.  Take deep breaths and cough every 1-2 hours. Your doctor may order a device called an Incentive Spirometer to help you take deep breaths. When coughing or sneezing, hold a pillow firmly against your incision with both hands. This is called "splinting." Doing this helps protect your incision. It also decreases belly discomfort.  If you are being admitted to the hospital overnight, leave your suitcase in the car. After surgery it may be brought to your room.  If you are being discharged the day of surgery, you will not be allowed to drive home. You will need a responsible adult (18 years or older) to drive you home and stay with you that night.   If you are taking public  transportation, you will need to have a responsible adult (18 years or older) with you. Please confirm with your physician that it is acceptable to use public transportation.   Please call the [place] [facility]. at [phone] if you have any questions about these instructions.  Surgery Visitation Policy:  Patients undergoing a surgery or procedure may have one family member or support person with them as long as that person is not COVID-19 positive or experiencing its symptoms.  That person may remain in the waiting area during the procedure.  Inpatient Visitation:    Visiting hours are 7 a.m. to 8 p.m. Inpatients will be allowed two visitors daily. The visitors may change each day during the patient's stay. No visitors under the age of 82. Any visitor under the age of 27 must be accompanied by an adult. The visitor must pass COVID-19 screenings, use hand sanitizer when entering and exiting the patient's room and wear a mask at all times, including in the patient's room. Patients must also wear a mask when staff or their visitor are in the room. Masking is required regardless of vaccination status.

## 2020-11-17 MED ORDER — CHLORHEXIDINE GLUCONATE CLOTH 2 % EX PADS: 6.0000 | MEDICATED_PAD | Freq: Once | CUTANEOUS | Status: DC

## 2020-11-17 MED ORDER — ORAL CARE MOUTH RINSE: 15.0000 mL | Freq: Once | OROMUCOSAL | Status: AC

## 2020-11-17 MED ORDER — SODIUM CHLORIDE 0.9 % IV SOLN: INTRAVENOUS | Status: DC

## 2020-11-17 MED ORDER — CELECOXIB 200 MG PO CAPS: 200.0000 mg | ORAL_CAPSULE | ORAL | Status: AC

## 2020-11-17 MED ORDER — ACETAMINOPHEN 500 MG PO TABS: 1000.0000 mg | ORAL_TABLET | ORAL | Status: AC

## 2020-11-17 MED ORDER — CEFAZOLIN SODIUM-DEXTROSE 2-4 GM/100ML-% IV SOLN
2.0000 g | INTRAVENOUS | Status: AC
  Administered 2020-11-18: 2 g via INTRAVENOUS

## 2020-11-17 MED ORDER — CHLORHEXIDINE GLUCONATE 0.12 % MT SOLN: 15.0000 mL | Freq: Once | OROMUCOSAL | Status: AC

## 2020-11-18 ENCOUNTER — Encounter
Admission: RE | Disposition: A | Discharge status: Home / Self Care | Payer: Self-pay | Source: Home / Self Care | Attending: Orthopedic Surgery

## 2020-11-18 ENCOUNTER — Ambulatory Visit
Admission: RE | Admit: 2020-11-18 | Discharge: 2020-11-18 | Disposition: A | Discharge status: Home / Self Care | Payer: Medicaid Other | Attending: Orthopedic Surgery | Admitting: Orthopedic Surgery

## 2020-11-18 ENCOUNTER — Ambulatory Visit: Payer: Medicaid Other | Admitting: Anesthesiology

## 2020-11-18 ENCOUNTER — Other Ambulatory Visit: Payer: Self-pay

## 2020-11-18 ENCOUNTER — Encounter: Payer: Self-pay | Admitting: Orthopedic Surgery

## 2020-11-18 DIAGNOSIS — Z885 Allergy status to narcotic agent status: Secondary | ICD-10-CM | POA: Diagnosis not present

## 2020-11-18 DIAGNOSIS — Z79899 Other long term (current) drug therapy: Secondary | ICD-10-CM | POA: Insufficient documentation

## 2020-11-18 DIAGNOSIS — M24661 Ankylosis, right knee: Secondary | ICD-10-CM | POA: Diagnosis not present

## 2020-11-18 DIAGNOSIS — Z880 Allergy status to penicillin: Secondary | ICD-10-CM | POA: Insufficient documentation

## 2020-11-18 DIAGNOSIS — Z881 Allergy status to other antibiotic agents status: Secondary | ICD-10-CM | POA: Insufficient documentation

## 2020-11-18 DIAGNOSIS — Z7982 Long term (current) use of aspirin: Secondary | ICD-10-CM | POA: Diagnosis not present

## 2020-11-18 DIAGNOSIS — Z7951 Long term (current) use of inhaled steroids: Secondary | ICD-10-CM | POA: Diagnosis not present

## 2020-11-18 DIAGNOSIS — Z96651 Presence of right artificial knee joint: Secondary | ICD-10-CM | POA: Diagnosis not present

## 2020-11-18 DIAGNOSIS — Z7984 Long term (current) use of oral hypoglycemic drugs: Secondary | ICD-10-CM | POA: Insufficient documentation

## 2020-11-18 DIAGNOSIS — Z79890 Hormone replacement therapy (postmenopausal): Secondary | ICD-10-CM | POA: Diagnosis not present

## 2020-11-18 DIAGNOSIS — Z87891 Personal history of nicotine dependence: Secondary | ICD-10-CM | POA: Diagnosis not present

## 2020-11-18 HISTORY — PX: KNEE CLOSED REDUCTION: SHX995

## 2020-11-18 LAB — CBC WITH DIFFERENTIAL/PLATELET
Abs Immature Granulocytes: 0.02 10*3/uL (ref 0.00–0.07)
Basophils Absolute: 0.1 10*3/uL (ref 0.0–0.1)
Basophils Relative: 1 %
Eosinophils Absolute: 0.2 10*3/uL (ref 0.0–0.5)
Eosinophils Relative: 2 %
HCT: 36.6 % (ref 36.0–46.0)
Hemoglobin: 11.8 g/dL — ABNORMAL LOW (ref 12.0–15.0)
Immature Granulocytes: 0 %
Lymphocytes Relative: 40 %
Lymphs Abs: 2.7 10*3/uL (ref 0.7–4.0)
MCH: 29.4 pg (ref 26.0–34.0)
MCHC: 32.2 g/dL (ref 30.0–36.0)
MCV: 91.3 fL (ref 80.0–100.0)
Monocytes Absolute: 0.7 10*3/uL (ref 0.1–1.0)
Monocytes Relative: 10 %
Neutro Abs: 3.2 10*3/uL (ref 1.7–7.7)
Neutrophils Relative %: 47 %
Platelets: 285 10*3/uL (ref 150–400)
RBC: 4.01 MIL/uL (ref 3.87–5.11)
RDW: 14.8 % (ref 11.5–15.5)
WBC: 6.7 10*3/uL (ref 4.0–10.5)
nRBC: 0 % (ref 0.0–0.2)

## 2020-11-18 LAB — POCT I-STAT, CHEM 8
BUN: 12 mg/dL (ref 6–20)
Calcium, Ion: 1.11 mmol/L — ABNORMAL LOW (ref 1.15–1.40)
Chloride: 105 mmol/L (ref 98–111)
Creatinine, Ser: 0.9 mg/dL (ref 0.44–1.00)
Glucose, Bld: 96 mg/dL (ref 70–99)
HCT: 37 % (ref 36.0–46.0)
Hemoglobin: 12.6 g/dL (ref 12.0–15.0)
Potassium: 3.1 mmol/L — ABNORMAL LOW (ref 3.5–5.1)
Sodium: 143 mmol/L (ref 135–145)
TCO2: 31 mmol/L (ref 22–32)

## 2020-11-18 LAB — BASIC METABOLIC PANEL
Anion gap: 10 (ref 5–15)
BUN: 13 mg/dL (ref 6–20)
CO2: 28 mmol/L (ref 22–32)
Calcium: 8.8 mg/dL — ABNORMAL LOW (ref 8.9–10.3)
Chloride: 102 mmol/L (ref 98–111)
Creatinine, Ser: 1.02 mg/dL — ABNORMAL HIGH (ref 0.44–1.00)
GFR, Estimated: >60 mL/min (ref >60)
Glucose, Bld: 97 mg/dL (ref 70–99)
Potassium: 3.1 mmol/L — ABNORMAL LOW (ref 3.5–5.1)
Sodium: 140 mmol/L (ref 135–145)

## 2020-11-18 LAB — GLUCOSE, CAPILLARY: Glucose-Capillary: 91 mg/dL (ref 70–99)

## 2020-11-18 SURGERY — MANIPULATION, KNEE, CLOSED
Anesthesia: General | Site: Knee | Laterality: Right

## 2020-11-18 MED ORDER — DEXAMETHASONE SODIUM PHOSPHATE 10 MG/ML IJ SOLN
INTRAMUSCULAR | Status: DC | PRN
  Administered 2020-11-18: 10 mg via INTRAVENOUS

## 2020-11-18 MED ORDER — PROPOFOL 10 MG/ML IV BOLUS
INTRAVENOUS | Status: DC | PRN
  Administered 2020-11-18: 180 mg via INTRAVENOUS

## 2020-11-18 MED ORDER — CELECOXIB 200 MG PO CAPS
ORAL_CAPSULE | ORAL | Status: AC
  Administered 2020-11-18: 200 mg via ORAL
  Filled 2020-11-18: qty 1

## 2020-11-18 MED ORDER — ONDANSETRON HCL 4 MG PO TABS: 4.0000 mg | ORAL_TABLET | Freq: Three times a day (TID) | ORAL | 0 refills | Status: AC | PRN

## 2020-11-18 MED ORDER — PROPOFOL 10 MG/ML IV BOLUS
INTRAVENOUS | Status: AC
  Filled 2020-11-18: qty 20

## 2020-11-18 MED ORDER — OXYCODONE HCL 5 MG PO TABS
5.0000 mg | ORAL_TABLET | Freq: Once | ORAL | Status: AC | PRN
  Administered 2020-11-18: 5 mg via ORAL

## 2020-11-18 MED ORDER — FAMOTIDINE 20 MG PO TABS
ORAL_TABLET | ORAL | Status: AC
  Administered 2020-11-18: 20 mg
  Filled 2020-11-18: qty 1

## 2020-11-18 MED ORDER — MIDAZOLAM HCL 2 MG/2ML IJ SOLN
INTRAMUSCULAR | Status: AC
  Filled 2020-11-18: qty 2

## 2020-11-18 MED ORDER — ACETAMINOPHEN 500 MG PO TABS
ORAL_TABLET | ORAL | Status: AC
  Administered 2020-11-18: 1000 mg via ORAL
  Filled 2020-11-18: qty 2

## 2020-11-18 MED ORDER — LIDOCAINE HCL (PF) 1 % IJ SOLN
INTRAMUSCULAR | Status: AC
  Filled 2020-11-18: qty 30

## 2020-11-18 MED ORDER — OXYCODONE HCL 5 MG/5ML PO SOLN: 5.0000 mg | Freq: Once | ORAL | Status: AC | PRN

## 2020-11-18 MED ORDER — PHENYLEPHRINE HCL (PRESSORS) 10 MG/ML IV SOLN
INTRAVENOUS | Status: DC | PRN
  Administered 2020-11-18: 200 ug via INTRAVENOUS

## 2020-11-18 MED ORDER — FENTANYL CITRATE (PF) 100 MCG/2ML IJ SOLN
INTRAMUSCULAR | Status: DC | PRN
  Administered 2020-11-18: 50 ug via INTRAVENOUS

## 2020-11-18 MED ORDER — CHLORHEXIDINE GLUCONATE 0.12 % MT SOLN
OROMUCOSAL | Status: AC
  Administered 2020-11-18: 15 mL via OROMUCOSAL
  Filled 2020-11-18: qty 15

## 2020-11-18 MED ORDER — LIDOCAINE HCL (CARDIAC) PF 100 MG/5ML IV SOSY
PREFILLED_SYRINGE | INTRAVENOUS | Status: DC | PRN
  Administered 2020-11-18: 100 mg via INTRAVENOUS

## 2020-11-18 MED ORDER — CEFAZOLIN SODIUM-DEXTROSE 2-4 GM/100ML-% IV SOLN
INTRAVENOUS | Status: AC
  Filled 2020-11-18: qty 100

## 2020-11-18 MED ORDER — ACETAMINOPHEN 10 MG/ML IV SOLN: 1000.0000 mg | Freq: Once | INTRAVENOUS | Status: DC | PRN

## 2020-11-18 MED ORDER — PROMETHAZINE HCL 25 MG/ML IJ SOLN
6.2500 mg | INTRAMUSCULAR | Status: DC | PRN
Start: 2020-11-18 — End: 2020-11-18

## 2020-11-18 MED ORDER — ONDANSETRON HCL 4 MG/2ML IJ SOLN
INTRAMUSCULAR | Status: DC | PRN
  Administered 2020-11-18: 4 mg via INTRAVENOUS

## 2020-11-18 MED ORDER — PHENYLEPHRINE HCL (PRESSORS) 10 MG/ML IV SOLN
INTRAVENOUS | Status: AC
  Filled 2020-11-18: qty 1

## 2020-11-18 MED ORDER — FENTANYL CITRATE (PF) 100 MCG/2ML IJ SOLN
INTRAMUSCULAR | Status: AC
  Filled 2020-11-18: qty 2

## 2020-11-18 MED ORDER — BUPIVACAINE-EPINEPHRINE (PF) 0.25% -1:200000 IJ SOLN
INTRAMUSCULAR | Status: AC
  Filled 2020-11-18: qty 30

## 2020-11-18 MED ORDER — OXYCODONE HCL 5 MG PO TABS
ORAL_TABLET | ORAL | Status: AC
  Filled 2020-11-18: qty 1

## 2020-11-18 SURGICAL SUPPLY — 47 items
ADAPTER IRRIG TUBE 2 SPIKE SOL (ADAPTER) IMPLANT
BLADE FULL RADIUS 3.5 (BLADE) IMPLANT
BLADE SHAVER 4.5X7 STR FR (MISCELLANEOUS) IMPLANT
BUR BR 5.5 WIDE MOUTH (BURR) IMPLANT
CANISTER SUCT LVC 12 LTR MEDI- (MISCELLANEOUS) IMPLANT
CNTNR SPEC 2.5X3XGRAD LEK (MISCELLANEOUS)
CONT SPEC 4OZ STER OR WHT (MISCELLANEOUS)
CONTAINER SPEC 2.5X3XGRAD LEK (MISCELLANEOUS) IMPLANT
COOLER POLAR GLACIER W/PUMP (MISCELLANEOUS) IMPLANT
CUFF TOURN SGL QUICK 24 (TOURNIQUET CUFF)
CUFF TOURN SGL QUICK 34 (TOURNIQUET CUFF)
CUFF TRNQT CYL 24X4X16.5-23 (TOURNIQUET CUFF) IMPLANT
CUFF TRNQT CYL 34X4.125X (TOURNIQUET CUFF) IMPLANT
DEVICE SUCT BLK HOLE OR FLOOR (MISCELLANEOUS) IMPLANT
DRAPE IMP U-DRAPE 54X76 (DRAPES) IMPLANT
DURAPREP 26ML APPLICATOR (WOUND CARE) IMPLANT
GAUZE SPONGE 4X4 12PLY STRL (GAUZE/BANDAGES/DRESSINGS) IMPLANT
GAUZE XEROFORM 1X8 LF (GAUZE/BANDAGES/DRESSINGS) IMPLANT
GLOVE SURG ORTHO LTX SZ9 (GLOVE) IMPLANT
GLOVE SURG UNDER POLY LF SZ9 (GLOVE) IMPLANT
GOWN STRL REUS TWL 2XL XL LVL4 (GOWN DISPOSABLE) IMPLANT
GOWN STRL REUS W/ TWL LRG LVL3 (GOWN DISPOSABLE) IMPLANT
GOWN STRL REUS W/TWL LRG LVL3 (GOWN DISPOSABLE)
IV LACTATED RINGER IRRG 3000ML (IV SOLUTION)
IV LR IRRIG 3000ML ARTHROMATIC (IV SOLUTION) IMPLANT
KIT TURNOVER KIT A (KITS) ×2 IMPLANT
MANIFOLD NEPTUNE II (INSTRUMENTS) IMPLANT
MAT ABSORB  FLUID 56X50 GRAY (MISCELLANEOUS)
MAT ABSORB FLUID 56X50 GRAY (MISCELLANEOUS) IMPLANT
NEEDLE HYPO 22GX1.5 SAFETY (NEEDLE) IMPLANT
PACK ARTHROSCOPY KNEE (MISCELLANEOUS) IMPLANT
PAD ABD DERMACEA PRESS 5X9 (GAUZE/BANDAGES/DRESSINGS) IMPLANT
PAD WRAPON POLAR KNEE (MISCELLANEOUS) IMPLANT
SHAVER BLADE BONE CUTTER  5.5 (BLADE)
SHAVER BLADE BONE CUTTER 5.5 (BLADE) IMPLANT
SOL PREP PVP 2OZ (MISCELLANEOUS)
SOLUTION PREP PVP 2OZ (MISCELLANEOUS) IMPLANT
SPONGE T-LAP 18X18 ~~LOC~~+RFID (SPONGE) IMPLANT
STRIP CLOSURE SKIN 1/2X4 (GAUZE/BANDAGES/DRESSINGS) IMPLANT
SUT ETHILON 4-0 (SUTURE)
SUT ETHILON 4-0 FS2 18XMFL BLK (SUTURE)
SUTURE ETHLN 4-0 FS2 18XMF BLK (SUTURE) IMPLANT
TUBING INFLOW SET DBFLO PUMP (TUBING) IMPLANT
TUBING OUTFLOW SET DBLFO PUMP (TUBING) IMPLANT
WAND WEREWOLF FLOW 90D (MISCELLANEOUS) IMPLANT
WATER STERILE IRR 500ML POUR (IV SOLUTION) IMPLANT
WRAPON POLAR PAD KNEE (MISCELLANEOUS)

## 2020-11-18 NOTE — Anesthesia Procedure Notes (Signed)
Procedure Name: LMA Insertion Date/Time: 11/18/2020 11:22 AM Performed by: Doreen Salvage, CRNA Pre-anesthesia Checklist: Patient identified, Patient being monitored, Timeout performed, Emergency Drugs available and Suction available Patient Re-evaluated:Patient Re-evaluated prior to induction Oxygen Delivery Method: Circle system utilized Preoxygenation: Pre-oxygenation with 100% oxygen Induction Type: IV induction Ventilation: Mask ventilation without difficulty LMA: LMA inserted LMA Size: 4.0 Tube type: Oral Number of attempts: 1 Placement Confirmation: positive ETCO2 and breath sounds checked- equal and bilateral Tube secured with: Tape Dental Injury: Teeth and Oropharynx as per pre-operative assessment

## 2020-11-18 NOTE — Op Note (Signed)
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  Patient's postop course was complicated by hospital admission for sepsis on 09/15/2020 which caused a delay in beginning her outpatient physical therapy postop.  Patient has not made progress with her knee range of motion over the last few weeks per her physical therapist.  Therefore the decision was made to proceed with a manipulation under anesthesia with possible arthroscopic lysis of adhesions.  I discussed the details of the operation with the patient including the possibility of close manipulation versus arthroscopic lysis of adhesions.  The risks and benefits of surgery were discussed with the patient. The risks include but are not limited to infection, bleeding/hemarthrosis, nerve or blood vessel injury, joint stiffness or loss of motion, persistent pain, weakness or instability, fracture, dislocation, hardware failure and the need for further surgery. Medical risks include but are not limited to DVT and pulmonary embolism, myocardial infarction, stroke, pneumonia, respiratory failure and death. Patient understood these risks and wished to proceed.   OPERATIVE FINDINGS: Right knee ROM pre-manipulation of -10 to 70 degrees  OPERATIVE PROCEDURE: Patient was brought to the operating room.  She was placed supine on the operative table.  She underwent general anesthesia with an LMA.  A timeout was performed to verify the patient's name, date of birth, medical record number, correct site of surgery and correct procedure to be performed.  Once all in  attendance were in agreement the case began.  Patient received 2 g of Ancef IV at the onset of the case.  Examination under anesthesia revealed the patient had a well-healed midline incision over the right knee.  Her premanipulation range of motion was -10 to 70 degrees.  Patient then underwent gentle manipulation of the right knee with pressure over the proximal tibia.  Gentle steady pressure was applied to the knee with her hip flexed at 90 degrees.  The patient slowly increase her range of motion.  Adhesions could be felt breaking up with slow pressure to the right knee.  Patient was flexed to between 110 to 115 degrees at the conclusion of the manipulation.  Patient was then brought into extension and gentle pressure applied to her right thigh.  The manipulation and extension also broke up some posterior adhesions and allowed for extension to -3 to 5 degrees.    Given the dramatic improvement in range of motion, no arthroscopic lysis of adhesions was required today.  Patient was awoken and brought to the PACU in stable condition.  I was present for the entire case.

## 2020-11-18 NOTE — Anesthesia Postprocedure Evaluation (Signed)
Anesthesia Post Note  Patient: Katherine Estes  Procedure(s) Performed: RIGHT CLOSED MANIPULATION KNEE (Right: Knee)  Patient location during evaluation: PACU Anesthesia Type: General Level of consciousness: awake and alert Pain management: pain level controlled Vital Signs Assessment: post-procedure vital signs reviewed and stable Respiratory status: spontaneous breathing, nonlabored ventilation and respiratory function stable Cardiovascular status: blood pressure returned to baseline and stable Postop Assessment: no apparent nausea or vomiting Anesthetic complications: no   No notable events documented.   Last Vitals:  Vitals:   11/18/20 1215 11/18/20 1226  BP: (!) 151/71 (!) 159/73  Pulse: 64 71  Resp: 16 14  Temp:  (!) 36.1 C  SpO2: 90% 98%    Last Pain:  Vitals:   11/18/20 1226  TempSrc: Temporal  PainSc: [place]

## 2020-11-18 NOTE — Transfer of Care (Signed)
Immediate Anesthesia Transfer of Care Note  Patient: Katherine Estes  Procedure(s) Performed: RIGHT CLOSED MANIPULATION KNEE (Right: Knee)  Patient Location: PACU  Anesthesia Type:General  Level of Consciousness: drowsy  Airway & Oxygen Therapy: Patient Spontanous Breathing  Post-op Assessment: Report given to RN and Post -op Vital signs reviewed and stable  Post vital signs: Reviewed and stable  Last Vitals:  Vitals Value Taken Time  BP 137/80 11/18/20 1201  Temp 36.7 C 11/18/20 1201  Pulse 68 11/18/20 1202  Resp 15 11/18/20 1202  SpO2 96 % 11/18/20 1202  Vitals shown include unvalidated device data.  Last Pain:  Vitals:   11/18/20 1201  TempSrc: Temporal  PainSc:       Patients Stated Pain Goal: 0 ([id number])  Complications: No notable events documented.

## 2020-11-18 NOTE — Anesthesia Preprocedure Evaluation (Addendum)
Anesthesia Evaluation  Patient identified by MRN, date of birth, ID band Patient awake    Reviewed: Allergy & Precautions, NPO status , Patient's Chart, lab work & pertinent test results  History of Anesthesia Complications Negative for: history of anesthetic complications  Airway Mallampati: III  TM Distance: >3 FB Neck ROM: full    Dental no notable dental hx.    Pulmonary neg shortness of breath, asthma , former smoker,    Pulmonary exam normal        Cardiovascular Exercise Tolerance: Good hypertension, Pt. on medications (-) angina(-) Past MI and (-) DOE Normal cardiovascular exam     Neuro/Psych  Headaches, negative psych ROS   GI/Hepatic negative GI ROS, Neg liver ROS,   Endo/Other  diabetes, Well Controlled, Type 2Hypothyroidism Morbid obesity  Renal/GU Renal disease     Musculoskeletal  (+) Arthritis ,   Abdominal   Peds  Hematology negative hematology ROS (+)   Anesthesia Other Findings Hypokalemia and hypocalcemia  Past Medical History: No date: Allergy No date: Anemia No date: Arthritis No date: Asthma No date: Diabetes mellitus without complication (HCC) No date: H/O nephrolithotomy with removal of calculi No date: Hypertension No date: Hypothyroidism No date: Osteoporosis No date: Thyroid disease  Past Surgical History: No date: BLADDER SURGERY     Comment:  BLADDER LIFTED No date: BREAST BIOPSY; Left     Comment:  neg No date: BREAST SURGERY     Comment:  LUMP EXCISION LEFT BREAST. No date: CESAREAN SECTION 04/11/2020: COLONOSCOPY WITH PROPOFOL; N/A     Comment:  Procedure: COLONOSCOPY WITH PROPOFOL;  Surgeon: Jonathon Bellows, MD;  Location: [facility] ENDOSCOPY;  Service:               Endoscopy;  Laterality: N/A; No date: THYROIDECTOMY No date: TONSILLECTOMY No date: WISDOM TOOTH EXTRACTION  BMI    Body Mass Index: 41.42 kg/m      Reproductive/Obstetrics negative OB  ROS                           Anesthesia Physical  Anesthesia Plan  ASA: 3  Anesthesia Plan: General   Post-op Pain Management:    Induction: Intravenous  PONV Risk Score and Plan: 3 and Ondansetron, Dexamethasone and Midazolam  Airway Management Planned: LMA  Additional Equipment:   Intra-op Plan:   Post-operative Plan:   Informed Consent: I have reviewed the patients History and Physical, chart, labs and discussed the procedure including the risks, benefits and alternatives for the proposed anesthesia with the patient or authorized representative who has indicated his/her understanding and acceptance.     Dental Advisory Given  Plan Discussed with: Anesthesiologist, CRNA and Surgeon  Anesthesia Plan Comments:         Anesthesia Quick Evaluation

## 2020-11-18 NOTE — Discharge Instructions (Signed)

## 2020-11-19 LAB — HEMOGLOBIN A1C
Hgb A1c MFr Bld: 5.7 % — ABNORMAL HIGH (ref 4.8–5.6)
Mean Plasma Glucose: 117 mg/dL

## 2020-11-24 NOTE — H&P (Addendum)
PREOPERATIVE H&P  Chief Complaint: Right Knee Adhesions of Lysis  HPI: Katherine Estes is a 60 y.o. female who presents for preoperative history and physical with a diagnosis of Right Knee Adhesions of Lysis.  Patient's knee stiffness is significantly impairing her ability to progress with physical therapy and activities of daily living.  She has agreed to manipulation under anesthesia with arthroscopic possible lysis of adhesions to improve her knee range of motion.   Past Medical History:  Diagnosis Date   Allergy    Anemia    Arthritis    Asthma    Diabetes mellitus without complication ([place])    H/O nephrolithotomy with removal of calculi    Hypertension    Hypothyroidism    Osteoporosis    Thyroid disease    Past Surgical History:  Procedure Laterality Date   BLADDER SURGERY     BLADDER LIFTED   BREAST BIOPSY Left    neg   BREAST SURGERY     LUMP EXCISION LEFT BREAST.   CESAREAN SECTION     COLONOSCOPY WITH PROPOFOL N/A 04/11/2020   Procedure: COLONOSCOPY WITH PROPOFOL;  Surgeon: Jonathon Bellows, MD;  Location: [facility] ENDOSCOPY;  Service: Endoscopy;  Laterality: N/A;   KNEE CLOSED REDUCTION Right 11/18/2020   Procedure: RIGHT CLOSED MANIPULATION KNEE;  Surgeon: Thornton Park, MD;  Location: [facility] ORS;  Service: Orthopedics;  Laterality: Right;   THYROIDECTOMY     TONSILLECTOMY     TOTAL KNEE ARTHROPLASTY Right 09/02/2020   Procedure: RIGHT TOTAL KNEE ARTHROPLASTY;  Surgeon: Thornton Park, MD;  Location: [facility] ORS;  Service: Orthopedics;  Laterality: Right;   WISDOM TOOTH EXTRACTION     Social History   Socioeconomic History   Marital status: Widowed    Spouse name: Not on file   Number of children: Not on file   Years of education: Not on file   Highest education level: Not on file  Occupational History   Not on file  Tobacco Use   Smoking status: Former    Packs/day: 0.25    Years: 3.00    Pack years: 0.75    Types: Cigarettes    Quit date: 05/21/1991     Years since quitting: 29.5   Smokeless tobacco: Never   Tobacco comments:    she only smoked when she drank  Vaping Use   Vaping Use: Never used  Substance and Sexual Activity   Alcohol use: Yes    Alcohol/week: 0.0 standard drinks    Comment: socially    Drug use: No   Sexual activity: Never  Other Topics Concern   Not on file  Social History Narrative   Not on file   Social Determinants of Health   Financial Resource Strain: Not on file  Food Insecurity: Not on file  Transportation Needs: Not on file  Physical Activity: Not on file  Stress: Not on file  Social Connections: Not on file   Family History  Problem Relation Age of Onset   Arthritis Mother    Asthma Mother    Hypertension Mother    Cancer Maternal Grandmother        THYRIOD   Breast cancer Neg Hx    Allergies  Allergen Reactions   Cinnamon     "not good" Mouth burns, tongue swells    Fentanyl Nausea And Vomiting    Hallucinations    Penicillins Hives and Rash    Received ceftriaxone in 2019   Prior to Admission medications   Medication Sig Start  Date End Date Taking? Authorizing Provider  albuterol (VENTOLIN HFA) 108 (90 Base) MCG/ACT inhaler Inhale 1-2 puffs into the lungs every 6 (six) hours as needed for wheezing or shortness of breath.   Yes [provider]  aspirin EC 325 MG tablet Take 1 tablet (325 mg total) by mouth in the morning and at bedtime. 09/19/20  Yes Wyvonnia Dusky, MD  cholecalciferol (VITAMIN D) 1000 units tablet Take 1,000 Units by mouth daily.   Yes [provider]  Fluticasone-Salmeterol (ADVAIR) 500-50 MCG/DOSE AEPB Inhale 1 puff into the lungs daily as needed (shortness of breath or wheezing).   Yes [provider]  hydrochlorothiazide (HYDRODIURIL) 25 MG tablet Take 25 mg by mouth daily.   Yes [provider]  levothyroxine (SYNTHROID) 150 MCG tablet Take 150 mcg by mouth daily before breakfast.   Yes [provider]  losartan  (COZAAR) 50 MG tablet Take 50 mg by mouth daily.   Yes [provider]  metFORMIN (GLUCOPHAGE) 500 MG tablet Take 500 mg by mouth daily with breakfast.   Yes [provider]  montelukast (SINGULAIR) 10 MG tablet Take 10 mg by mouth at bedtime.   Yes [provider]  ondansetron (ZOFRAN) 4 MG tablet Take 1 tablet (4 mg total) by mouth every 8 (eight) hours as needed for nausea or vomiting. 11/18/20  Yes Thornton Park, MD  pantoprazole (PROTONIX) 40 MG tablet Take 40 mg by mouth daily.   Yes [provider]  potassium chloride (KLOR-CON) 10 MEQ tablet Take 10 mEq by mouth daily. 01/21/20  Yes [provider]  oxyCODONE (OXY IR/ROXICODONE) 5 MG immediate release tablet Take 1-2 tablets (5-10 mg total) by mouth every 4 (four) hours as needed for moderate pain (pain score 4-6). Patient not taking: Reported on 11/13/2020 09/08/20   Thornton Park, MD     Positive ROS: All other systems have been reviewed and were otherwise negative with the exception of those mentioned in the HPI and as above.  Physical Exam: General: Alert, no acute distress Cardiovascular: Regular rate and rhythm, no murmurs rubs or gallops.  No pedal edema Respiratory: Clear to auscultation bilaterally, no wheezes rales or rhonchi. No cyanosis, no use of accessory musculature GI: No organomegaly, abdomen is soft and non-tender nondistended with positive bowel sounds. Skin: Skin intact, no lesions within the operative field. Neurologic: Sensation intact distally Psychiatric: Patient is competent for consent with normal mood and affect Lymphatic: No cervical lymphadenopathy  MUSCULOSKELETAL: Right knee: Patient's previous total knee arthroplasty incision is well-healed.  There is no erythema ecchymosis or effusion.  Patient has range of motion from approximately -5 to 70 degrees of flexion.  Distally she is neurovascular intact.  She has no calf tenderness or lower leg edema.  She has  intact motor and sensory function in the right lower extremity.  There is no ligamentous laxity.  Assessment: Right Knee Adhesions of Lysis  Plan: Plan for Procedure(s): RIGHT CLOSED MANIPULATION KNEE WITH POSSIBLE ARTHROSCOPIC LYSIS OF ADHESIONS  I reviewed the details of the proposed procedure with the patient including the possible need for arthroscopic lysis of adhesions.  A preop history and physical was performed at the bedside this morning.  I marked the right knee according to hospital's correct site of surgery protocol.  I answered all the patient's questions.  I discussed the risks and benefits of surgery. The risks include but are not limited to infection, fracture, tendon or ligament tear, bleeding, nerve or blood vessel injury, joint  stiffness or loss of motion, persistent pain, weakness or instability, and the need for further surgery. Medical risks include but are not limited to DVT and pulmonary embolism, myocardial infarction, stroke, pneumonia, respiratory failure and death. Patient understood these risks and wished to proceed.     Thornton Park, MD   11/24/2020 9:46 AM

## 2022-01-29 ENCOUNTER — Other Ambulatory Visit: Payer: Self-pay | Admitting: Family Medicine

## 2022-01-29 DIAGNOSIS — Z1231 Encounter for screening mammogram for malignant neoplasm of breast: Secondary | ICD-10-CM

## 2022-04-13 ENCOUNTER — Ambulatory Visit
Admission: RE | Admit: 2022-04-13 | Discharge: 2022-04-13 | Disposition: A | Discharge status: Home / Self Care | Payer: Medicaid Other | Source: Ambulatory Visit | Attending: Family Medicine | Admitting: Family Medicine

## 2022-04-13 DIAGNOSIS — Z1231 Encounter for screening mammogram for malignant neoplasm of breast: Secondary | ICD-10-CM | POA: Diagnosis not present

## 2022-11-05 DIAGNOSIS — Z0131 Encounter for examination of blood pressure with abnormal findings: Secondary | ICD-10-CM | POA: Diagnosis not present

## 2022-11-05 DIAGNOSIS — I10 Essential (primary) hypertension: Secondary | ICD-10-CM | POA: Diagnosis not present

## 2022-11-05 DIAGNOSIS — Z1389 Encounter for screening for other disorder: Secondary | ICD-10-CM | POA: Diagnosis not present

## 2022-11-05 DIAGNOSIS — R32 Unspecified urinary incontinence: Secondary | ICD-10-CM | POA: Diagnosis not present

## 2022-11-05 DIAGNOSIS — R7309 Other abnormal glucose: Secondary | ICD-10-CM | POA: Diagnosis not present

## 2022-11-05 DIAGNOSIS — E559 Vitamin D deficiency, unspecified: Secondary | ICD-10-CM | POA: Diagnosis not present

## 2022-11-12 DIAGNOSIS — U071 COVID-19: Secondary | ICD-10-CM | POA: Diagnosis not present

## 2022-11-15 ENCOUNTER — Encounter: Payer: Self-pay | Admitting: Obstetrics and Gynecology

## 2022-12-13 IMAGING — CR DG CHEST 2V
2 series · 2 of 2 positions shown · non-contrast
Comparison: 03/27/2018

CLINICAL DATA: Shortness of breath and cough

EXAM:
CHEST - 2 VIEW

[chest pa]
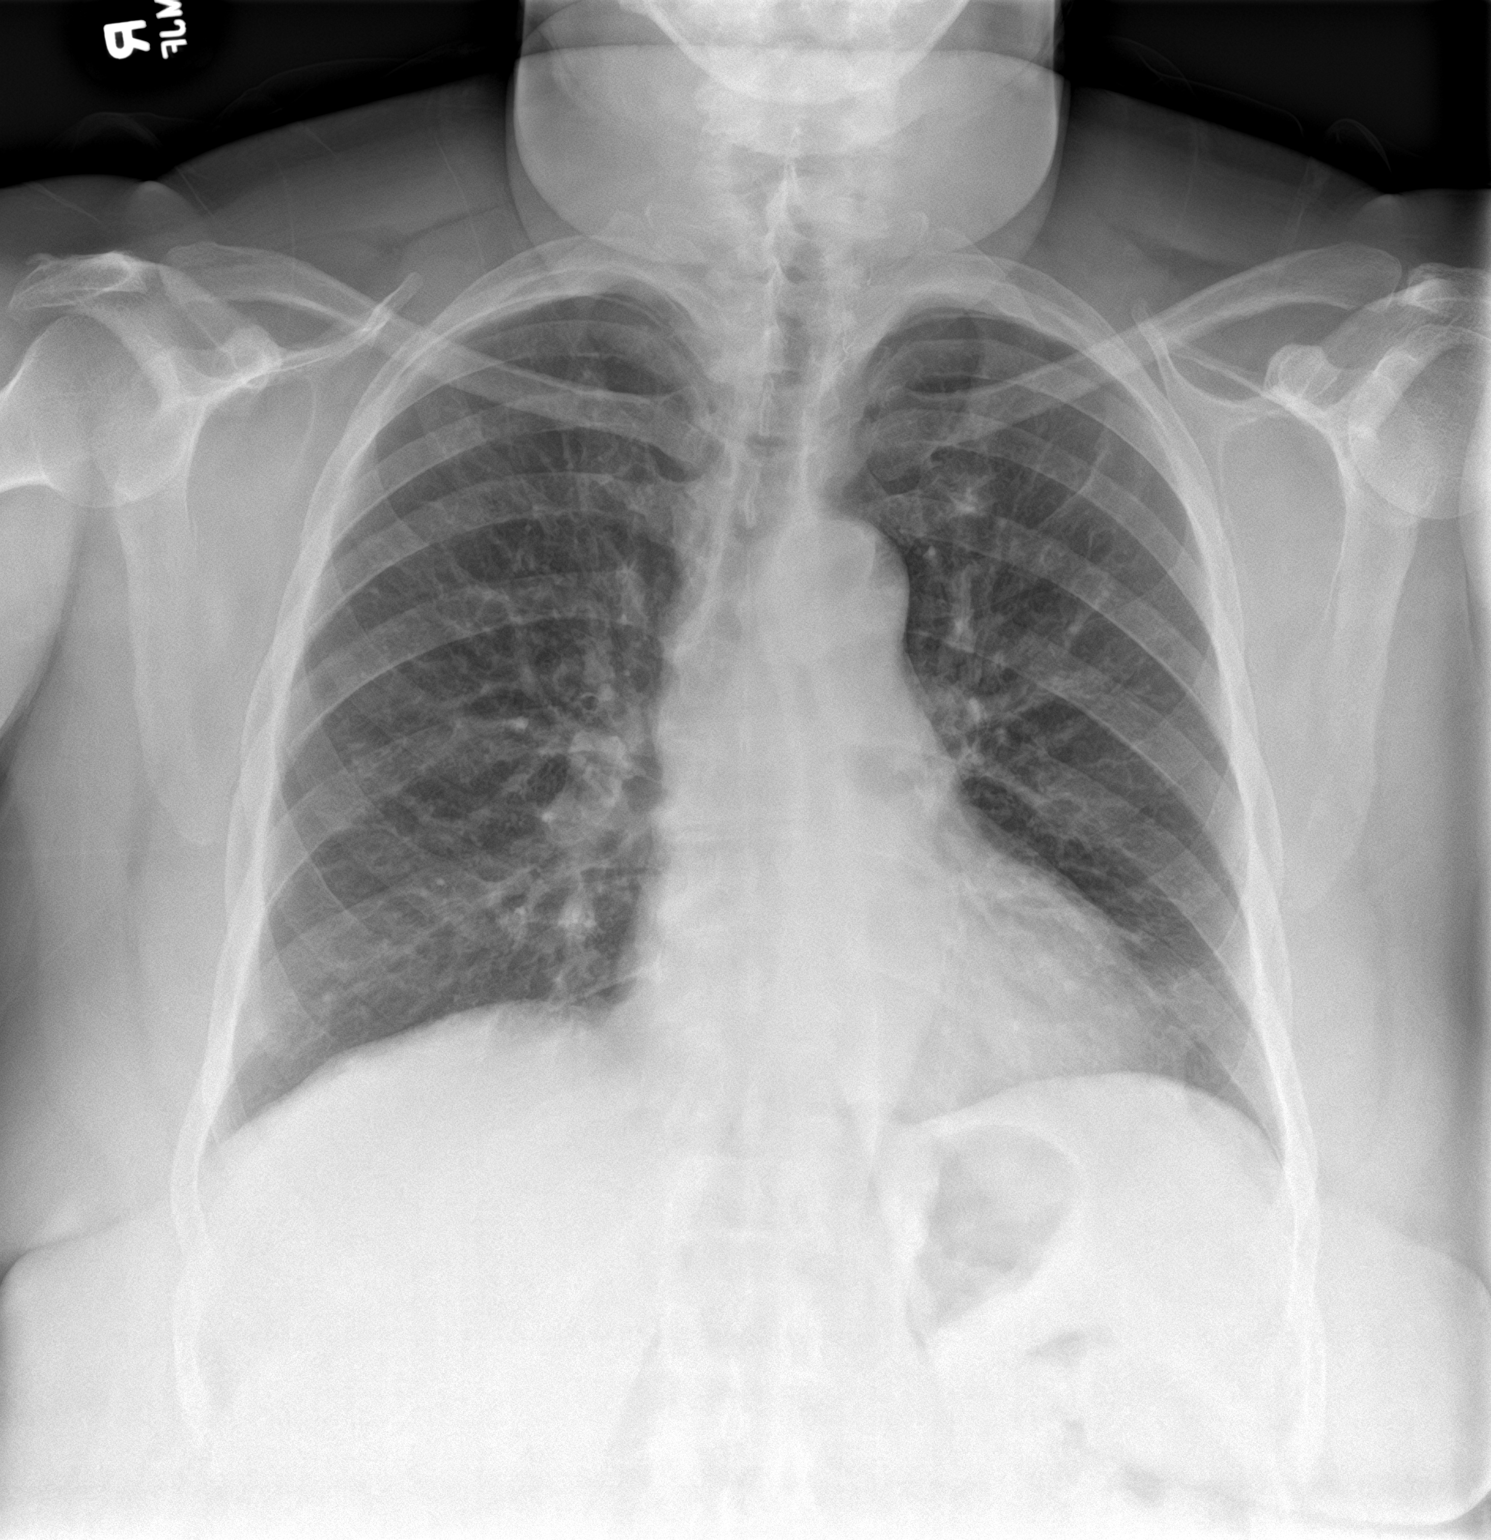

[chest lat]
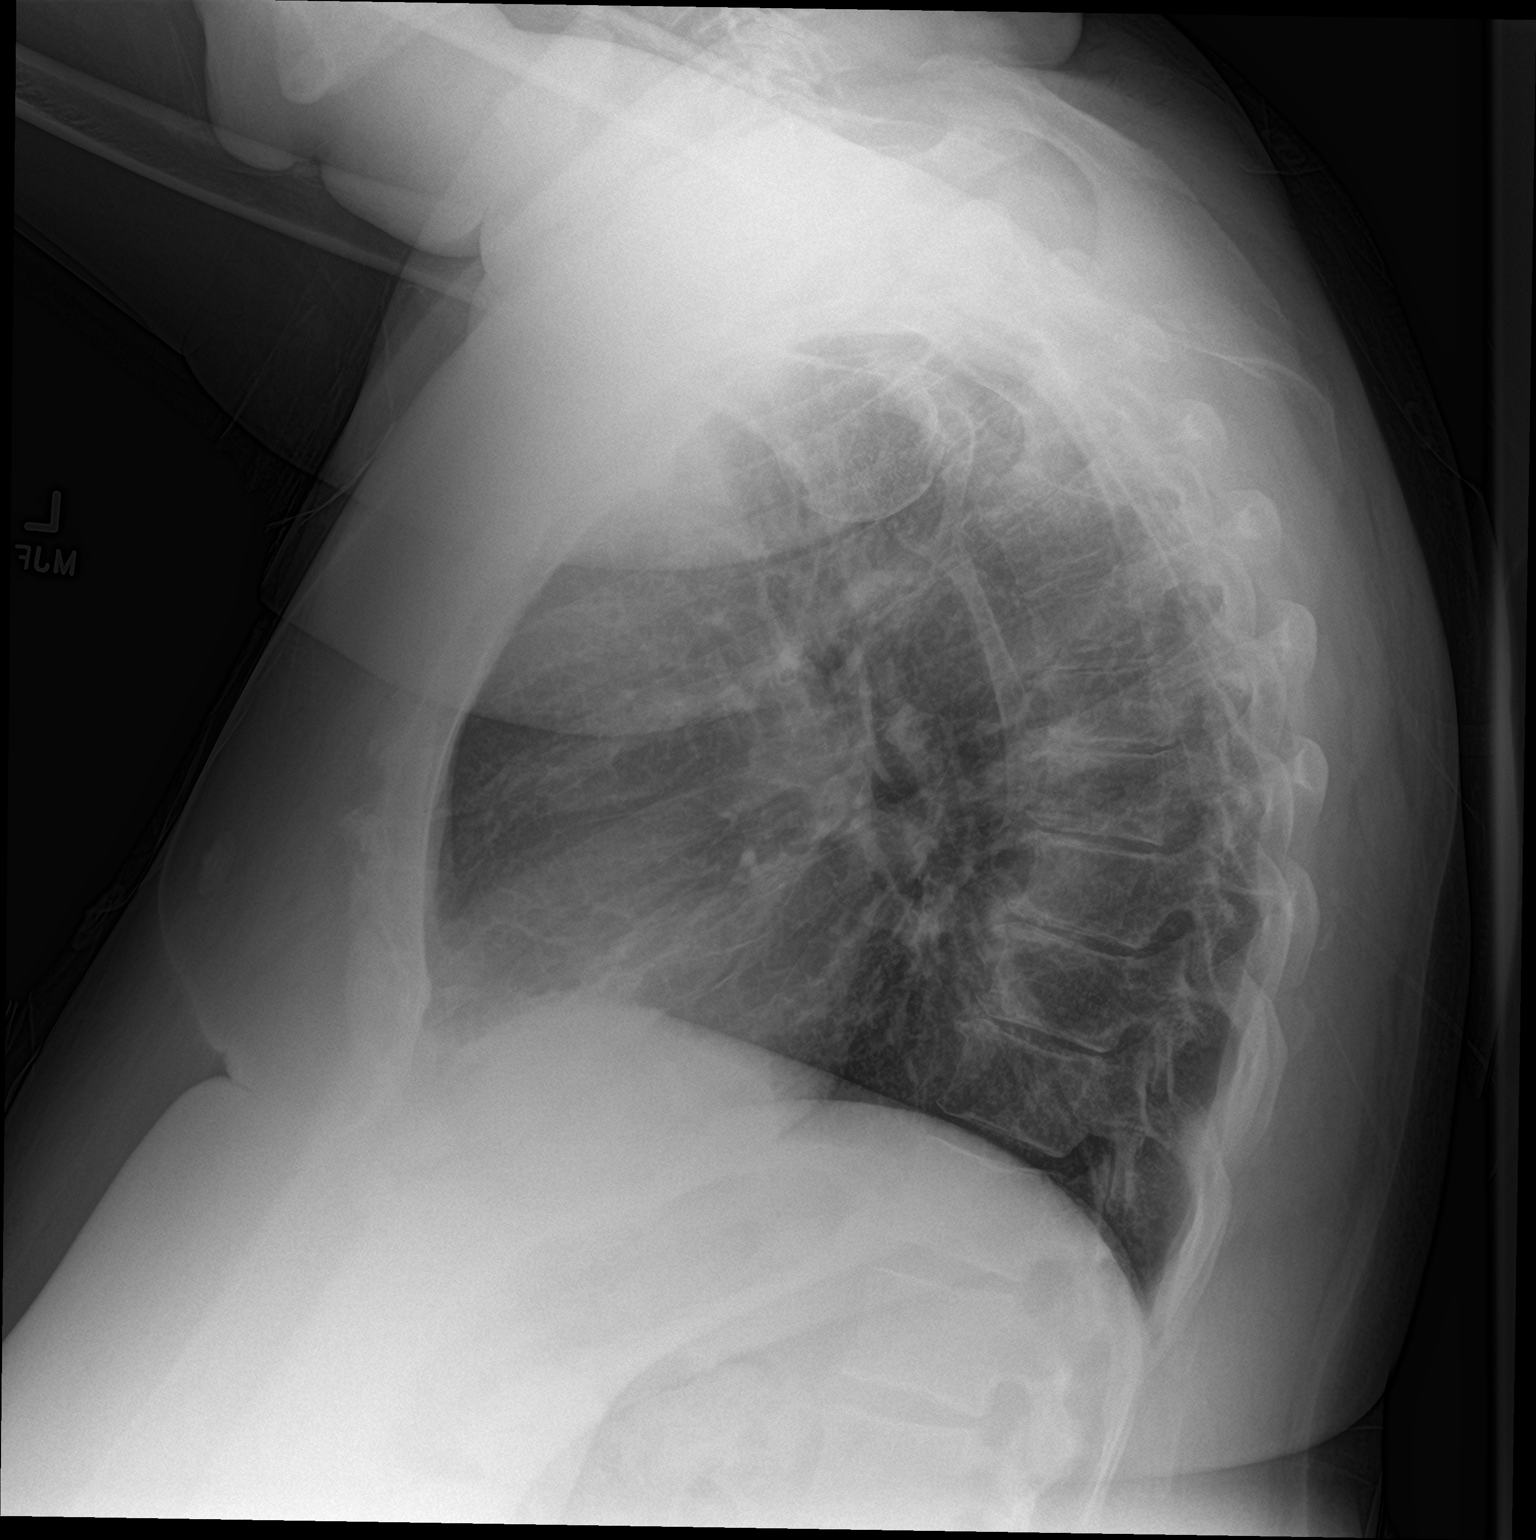

[2 of 2 positions shown; findings below may reference images not displayed]

FINDINGS: Cardiac shadow is stable. Mild central vascular congestion is noted
without edema. The lungs are well aerated bilaterally. No focal
infiltrate or effusion is seen. No acute bony abnormality is noted.
IMPRESSION: Mild central vascular congestion without edema. No other focal
abnormality is noted.

## 2023-06-13 ENCOUNTER — Other Ambulatory Visit: Payer: Self-pay

## 2023-06-13 DIAGNOSIS — E119 Type 2 diabetes mellitus without complications: Secondary | ICD-10-CM | POA: Diagnosis not present

## 2023-06-13 DIAGNOSIS — I10 Essential (primary) hypertension: Secondary | ICD-10-CM | POA: Diagnosis not present

## 2023-06-13 DIAGNOSIS — E039 Hypothyroidism, unspecified: Secondary | ICD-10-CM | POA: Insufficient documentation

## 2023-06-13 DIAGNOSIS — R0789 Other chest pain: Secondary | ICD-10-CM | POA: Diagnosis present

## 2023-06-13 NOTE — ED Triage Notes (Signed)
 Pt BIB EMS with chest pain that started around 10pm that woke her up from sleep. Per pt, pain radiates down her left arm. Pt denies SOB. Pain was relieved with aspirin and nitroglycerin.      324mg  aspirin Nitroglycerin x1 20G L AC  162/102 HR 92 CBG 102

## 2023-06-14 ENCOUNTER — Emergency Department: Payer: Self-pay

## 2023-06-14 ENCOUNTER — Emergency Department
Admission: EM | Admit: 2023-06-14 | Discharge: 2023-06-14 | Disposition: A | Discharge status: Home / Self Care | Payer: Self-pay | Attending: Emergency Medicine | Admitting: Emergency Medicine

## 2023-06-14 DIAGNOSIS — R0789 Other chest pain: Secondary | ICD-10-CM

## 2023-06-14 LAB — BASIC METABOLIC PANEL
Anion gap: 8 (ref 5–15)
BUN: 19 mg/dL (ref 8–23)
CO2: 24 mmol/L (ref 22–32)
Calcium: 8.1 mg/dL — ABNORMAL LOW (ref 8.9–10.3)
Chloride: 102 mmol/L (ref 98–111)
Creatinine, Ser: 1.05 mg/dL — ABNORMAL HIGH (ref 0.44–1.00)
GFR, Estimated: >60 mL/min (ref >60)
Glucose, Bld: 117 mg/dL — ABNORMAL HIGH (ref 70–99)
Potassium: 3.2 mmol/L — ABNORMAL LOW (ref 3.5–5.1)
Sodium: 134 mmol/L — ABNORMAL LOW (ref 135–145)

## 2023-06-14 LAB — CBC
HCT: 37.8 % (ref 36.0–46.0)
Hemoglobin: 12.1 g/dL (ref 12.0–15.0)
MCH: 29 pg (ref 26.0–34.0)
MCHC: 32 g/dL (ref 30.0–36.0)
MCV: 90.6 fL (ref 80.0–100.0)
Platelets: 296 10*3/uL (ref 150–400)
RBC: 4.17 MIL/uL (ref 3.87–5.11)
RDW: 14.9 % (ref 11.5–15.5)
WBC: 7.8 10*3/uL (ref 4.0–10.5)
nRBC: 0 % (ref 0.0–0.2)

## 2023-06-14 LAB — TROPONIN I (HIGH SENSITIVITY)
Troponin I (High Sensitivity): 10 ng/L (ref <18)
Troponin I (High Sensitivity): 6 ng/L (ref <18)

## 2023-06-14 NOTE — ED Provider Notes (Signed)
 [facility] Provider Note    Event Date/Time   First MD Initiated Contact with Patient 06/14/23 [phone]     (approximate)   History   Chest Pain   HPI  Katherine Estes is a 63 y.o. female with history of hypertension, hypothyroidism, diabetes, and asthma who presents with chest pain, acute onset around 10:30 PM last night, which awoke her from sleep.  She describes a sensation of her heart racing with palpitations as well as pain rating down the left arm.  She denies any associated shortness of breath, lightheadedness, or any nausea or vomiting.  She states that the pain resolved after she got aspirin and nitroglycerin from EMS.  She has had no symptoms for the last several hours.  She denies any leg swelling.  She has no cough or fever.  I reviewed the past medical records.  The patient was most recently admitted to the hospitalist service in 2022 with sepsis after right knee arthroplasty.  Physical Exam   Triage Vital Signs: ED Triage Vitals  Encounter Vitals Group     BP 06/13/23 2356 (!) 134/93     Systolic BP Percentile --      Diastolic BP Percentile --      Pulse Rate 06/13/23 2356 87     Resp 06/13/23 2356 20     Temp 06/13/23 2356 97.9 F (36.6 C)     Temp Source 06/13/23 2356 Oral     SpO2 06/13/23 2356 94 %     Weight 06/13/23 2353 265 lb (120.2 kg)     Height --      Head Circumference --      Peak Flow --      Pain Score 06/13/23 2353 0     Pain Loc --      Pain Education --      Exclude from Growth Chart --     Most recent vital signs: Vitals:   06/14/23 0315 06/14/23 0547  BP: 138/82 136/81  Pulse: 71 67  Resp: 20 20  Temp: 97.8 F (36.6 C) 97.8 F (36.6 C)  SpO2: 94% 97%     General: Alert, well-appearing, no distress.  CV:  Good peripheral perfusion.  Resp:  Normal effort.  Lungs CTAB. Abd:  No distention.  Other:  No peripheral edema.   ED Results / Procedures / Treatments   Labs (all labs ordered are  listed, but only abnormal results are displayed) Labs Reviewed  BASIC METABOLIC PANEL - Abnormal; Notable for the following components:      Result Value   Sodium 134 (*)    Potassium 3.2 (*)    Glucose, Bld 117 (*)    Creatinine, Ser 1.05 (*)    Calcium 8.1 (*)    All other components within normal limits  CBC  TROPONIN I (HIGH SENSITIVITY)  TROPONIN I (HIGH SENSITIVITY)     EKG  ED ECG REPORT I, Dionne Bucy, the attending physician, personally viewed and interpreted this ECG.  Date: 06/14/2023 EKG Time: 0005 Rate: 83 Rhythm: normal sinus rhythm QRS Axis: normal Intervals: Prolonged QTc, LAFB ST/T Wave abnormalities: normal Narrative Interpretation: no evidence of acute ischemia    RADIOLOGY  Chest x-ray: I independently viewed and interpreted the images; there is no focal consolidation or edema  PROCEDURES:  Critical Care performed: No  Procedures   MEDICATIONS ORDERED IN ED: Medications - No data to display   IMPRESSION / MDM / ASSESSMENT AND PLAN / ED COURSE  I reviewed the triage vital signs and the nursing notes.  63 year old female with PMH as noted above presents with atypical chest pain last evening which has now resolved for the last several hours.  She is currently asymptomatic.  On exam, vital signs are normal.  Physical exam is otherwise unremarkable.  EKG is nonischemic.  Chest x-ray is clear.  Differential diagnosis includes, but is not limited to, GERD, musculoskeletal chest wall pain, nerve pain, less likely ACS.  There is no clinical evidence for PE given lack of tachycardia or hypoxia, as well is the fact that the symptoms have now resolved.  Similarly there is no clinical evidence for aortic dissection or other vascular etiology.  Lab workup is reassuring.  Troponins are negative x 2.  BMP shows no acute findings.  CBC is normal.  Given that the patient has now been asymptomatic for several hours with negative workup, she is appropriate  for discharge home.  The patient feels well and is comfortable going home.  Patient's presentation is most consistent with acute presentation with potential threat to life or bodily function.  The patient is on the cardiac monitor to evaluate for evidence of arrhythmia and/or significant heart rate changes.  I counseled the patient on the results of the workup and plan of care.  I have ordered cardiology outpatient referral.  I gave strict return precautions and the patient expressed understanding.   FINAL CLINICAL IMPRESSION(S) / ED DIAGNOSES   Final diagnoses:  Atypical chest pain     Rx / DC Orders   ED Discharge Orders          Ordered    Ambulatory referral to Cardiology       Comments: If you have not heard from the Cardiology office within the next 72 hours please call [phone].   06/14/23 0536             Note:  This document was prepared using Dragon voice recognition software and may include unintentional dictation errors.    Dionne Bucy, MD 06/14/23 [phone]

## 2023-06-14 NOTE — Discharge Instructions (Addendum)
 We have ordered a referral for you to see cardiology.  You should be contacted by the cardiology office within the next week.  Return to the ER for new, worsening, or persistent severe chest pain, difficulty breathing, fever, weakness, or any other new or worsening symptoms that concern you.

## 2023-07-08 ENCOUNTER — Encounter: Payer: Self-pay | Admitting: Cardiology

## 2023-07-08 ENCOUNTER — Ambulatory Visit: Admitting: Cardiology

## 2023-07-08 ENCOUNTER — Ambulatory Visit: Attending: Cardiology | Admitting: Cardiology

## 2023-07-08 VITALS — BP 138/76 | HR 66 | Ht 66.0 in | Wt 250.6 lb

## 2023-07-08 DIAGNOSIS — I10 Essential (primary) hypertension: Secondary | ICD-10-CM

## 2023-07-08 DIAGNOSIS — R072 Precordial pain: Secondary | ICD-10-CM | POA: Diagnosis not present

## 2023-07-08 MED ORDER — METOPROLOL TARTRATE 100 MG PO TABS: ORAL_TABLET | ORAL | 0 refills | Status: AC

## 2023-07-08 NOTE — Patient Instructions (Signed)
 Medication Instructions:  Take Metoprolol  100 mg two hours before your CT scan when scheduled.   *If you need a refill on your cardiac medications before your next appointment, please call your pharmacy*   Testing/Procedures: Coronary CTA- they will call you to schedule this, please follow instructions below.  Your physician has requested that you have an echocardiogram. Echocardiography is a painless test that uses sound waves to create images of your heart. It provides your doctor with information about the size and shape of your heart and how well your heart's chambers and valves are working.   You may receive an ultrasound enhancing agent through an IV if needed to better visualize your heart during the echo. This procedure takes approximately one hour.  There are no restrictions for this procedure.  This will take place at [address] [facility] [address] [facility]) [address], [place] [address]  Please note: We ask at that you not bring children with you during ultrasound (echo/ vascular) testing. Due to room size and safety concerns, children are not allowed in the ultrasound rooms during exams. Our front office staff cannot provide observation of children in our lobby area while testing is being conducted. An adult accompanying a patient to their appointment will only be allowed in the ultrasound room at the discretion of the ultrasound technician under special circumstances. We apologize for any inconvenience.   Follow-Up: At [facility] [place] [facility], you and your health needs are our priority.  As part of our continuing mission to provide you with exceptional heart care, our providers are all part of one team.  This team includes your primary Cardiologist (physician) and Advanced Practice Providers or APPs (Physician Assistants and Nurse Practitioners) who all work together to provide you with the care you need, when you need it.  Your next appointment:   3 month(s)  Provider:   You may see  Dr.Agbor-Etang or one of the following Advanced Practice Providers on your designated Care Team:   Lonni Meager, NP Lesley Maffucci, PA-C Bernardino Bring, PA-C Cadence Aspinwall, PA-C Tylene Lunch, NP Barnie Hila, NP    We recommend signing up for the patient portal called MyChart.  Sign up information is provided on this After Visit Summary.  MyChart is used to connect with patients for Virtual Visits (Telemedicine).  Patients are able to view lab/test results, encounter notes, upcoming appointments, etc.  Non-urgent messages can be sent to your provider as well.   To learn more about what you can do with MyChart, go to [email or web address].   Other Instructions   Your cardiac CT will be scheduled at one of the below locations:   [facility] [address] [place], [place] [address] [phone]  OR   [facility] [address] [place], [place] [address] [phone]   There is spacious parking and easy access to the radiology department from the [facility] entrance. Please enter here and check-in with the desk attendant.    Please follow these instructions carefully (unless otherwise directed):  An IV will be required for this test and Nitroglycerin  will be given.  Hold all erectile dysfunction medications at least 3 days (72 hrs) prior to test. (Ie viagra, cialis, sildenafil, tadalafil, etc)   On the Night Before the Test: Be sure to Drink plenty of water. Do not consume any caffeinated/decaffeinated beverages or chocolate 12 hours prior to your test. Do not take any antihistamines 12 hours prior to your test.  On the Day of the Test: Drink plenty of water until 1 hour prior to the test. Do not eat any food 1 hour prior to test. You may take your regular medications prior to the test.  Take metoprolol  (Lopressor ) two hours prior to test. If you take  Furosemide /Hydrochlorothiazide /Spironolactone/Chlorthalidone, please HOLD on the morning of the test. Patients who wear a continuous glucose monitor MUST remove the device prior to scanning. FEMALES- please wear underwire-free bra if available, avoid dresses & tight clothing  After the Test: Drink plenty of water. After receiving IV contrast, you may experience a mild flushed feeling. This is normal. On occasion, you may experience a mild rash up to 24 hours after the test. This is not dangerous. If this occurs, you can take Benadryl  25 mg, Zyrtec, Claritin, or Allegra and increase your fluid intake. (Patients taking Tikosyn should avoid Benadryl , and may take Zyrtec, Claritin, or Allegra) If you experience trouble breathing, this can be serious. If it is severe call 911 IMMEDIATELY. If it is mild, please call our office.  We will call to schedule your test 2-4 weeks out understanding that some insurance companies will need an authorization prior to the service being performed.   For more information and frequently asked questions, please visit our website : [email or web address]  For non-scheduling related questions, please contact the cardiac imaging nurse navigator should you have any questions/concerns: Cardiac Imaging Nurse Navigators Direct Office Dial: [phone]   For scheduling needs, including cancellations and rescheduling, please call [place], [phone].

## 2023-07-08 NOTE — Progress Notes (Signed)
 Cardiology Office Note:    Date:  07/08/2023   ID:  Katherine Estes, DOB [birth year], MRN [id number]  PCP:  Chucky Craver, FNP   [facility] Providers Cardiologist:  None     Referring MD: Lind Repine, MD   Chief Complaint  Patient presents with   Atypical Chest Pain    Patient states that she hasn't experienced any chest pain since she was discharged from the ER. Meds reviewed.    Katherine Estes is a 63 y.o. female who is being seen today for the evaluation of chest pain at the request of Lind Repine, MD.   History of Present Illness:    Katherine Estes is a 63 y.o. female with a hx of hypertension, diabetes, hypothyroidism presenting with chest pain.  Patient was laying in bed about a month ago when she had episodes of chest discomfort and her heart racing.  Symptoms persisted, associated with left arm pain.  She told her mother who called EMS.  Patient was given aspirin  and sublingual nitroglycerin with resolution of symptoms.  Was taken to the ED where workup with troponins and EKG was unrevealing.  Has not had any episodes since, denies any prior episodes.  Denies any personal or family history of heart attacks.  Past Medical History:  Diagnosis Date   Allergy    Anemia    Arthritis    Asthma    Diabetes mellitus without complication (HCC)    H/O nephrolithotomy with removal of calculi    Hypertension    Hypothyroidism    Osteoporosis    Thyroid  disease     Past Surgical History:  Procedure Laterality Date   BLADDER SURGERY     BLADDER LIFTED   BREAST BIOPSY Left    neg   BREAST SURGERY     LUMP EXCISION LEFT BREAST.   CESAREAN SECTION     COLONOSCOPY WITH PROPOFOL  N/A 04/11/2020   Procedure: COLONOSCOPY WITH PROPOFOL ;  Surgeon: Luke Salaam, MD;  Location: [facility];  Service: Endoscopy;  Laterality: N/A;   KNEE CLOSED REDUCTION Right 11/18/2020   Procedure: RIGHT CLOSED MANIPULATION KNEE;  Surgeon: Rande Bushy, MD;   Location: [facility] ORS;  Service: Orthopedics;  Laterality: Right;   THYROIDECTOMY     TONSILLECTOMY     TOTAL KNEE ARTHROPLASTY Right 09/02/2020   Procedure: RIGHT TOTAL KNEE ARTHROPLASTY;  Surgeon: Rande Bushy, MD;  Location: [facility] ORS;  Service: Orthopedics;  Laterality: Right;   WISDOM TOOTH EXTRACTION      Current Medications: Current Meds  Medication Sig   albuterol  (VENTOLIN  HFA) 108 (90 Base) MCG/ACT inhaler Inhale 1-2 puffs into the lungs every 6 (six) hours as needed for wheezing or shortness of breath.   amLODipine  (NORVASC ) 5 MG tablet Take 5 mg by mouth daily.   FLUoxetine (PROZAC) 20 MG capsule Take 20 mg by mouth daily.   Fluticasone-Salmeterol (ADVAIR) 500-50 MCG/DOSE AEPB Inhale 1 puff into the lungs daily as needed (shortness of breath or wheezing).   levothyroxine  (SYNTHROID ) 150 MCG tablet Take 150 mcg by mouth daily before breakfast.   losartan  (COZAAR ) 50 MG tablet Take 50 mg by mouth daily.   metFORMIN  (GLUCOPHAGE ) 500 MG tablet Take 500 mg by mouth daily with breakfast.   metoprolol  tartrate (LOPRESSOR ) 100 MG tablet Take 1 tablet by mouth once for procedure.   montelukast  (SINGULAIR ) 10 MG tablet Take 10 mg by mouth at bedtime.   pantoprazole  (PROTONIX ) 40 MG tablet Take 40 mg by mouth daily.  Allergies:   Cinnamon, Fentanyl , and Penicillins   Social History   Socioeconomic History   Marital status: Widowed    Spouse name: Not on file   Number of children: Not on file   Years of education: Not on file   Highest education level: Not on file  Occupational History   Not on file  Tobacco Use   Smoking status: Former    Current packs/day: 0.00    Average packs/day: 0.3 packs/day for 3.0 years (0.8 ttl pk-yrs)    Types: Cigarettes    Start date: 05/20/1988    Quit date: 05/21/1991    Years since quitting: 32.1   Smokeless tobacco: Never   Tobacco comments:    she only smoked when she drank  Vaping Use   Vaping status: Never Used  Substance and Sexual  Activity   Alcohol use: Yes    Alcohol/week: 0.0 standard drinks of alcohol    Comment: socially    Drug use: No   Sexual activity: Never  Other Topics Concern   Not on file  Social History Narrative   Not on file   Social Drivers of Health   Financial Resource Strain: Not on file  Food Insecurity: Not on file  Transportation Needs: Not on file  Physical Activity: Not on file  Stress: Not on file  Social Connections: Not on file     Family History: The patient's family history includes Arthritis in her mother; Asthma in her mother; Cancer in her maternal grandmother; Hypertension in her mother. There is no history of Breast cancer.  ROS:   Please see the history of present illness.     All other systems reviewed and are negative.  EKGs/Labs/Other Studies Reviewed:    The following studies were reviewed today:  EKG Interpretation Date/Time:  Friday July 08 2023 14:26:22 EDT Ventricular Rate:  66 PR Interval:  176 QRS Duration:  100 QT Interval:  438 QTC Calculation: 459 R Axis:   -34  Text Interpretation: Normal sinus rhythm Left axis deviation Minimal voltage criteria for LVH, may be normal variant ( Cornell product ) T wave abnormality, consider lateral ischemia Confirmed by Constancia Delton ([id number]) on 07/08/2023 2:31:25 PM    Recent Labs: 06/13/2023: BUN 19; Creatinine, Ser 1.05; Hemoglobin 12.1; Platelets 296; Potassium 3.2; Sodium 134  Recent Lipid Panel No results found for: "CHOL", "TRIG", "HDL", "CHOLHDL", "VLDL", "LDLCALC", "LDLDIRECT"   Risk Assessment/Calculations:             Physical Exam:    VS:  BP 138/76   Pulse 66   Ht 5\' 6"  (1.676 m)   Wt 250 lb 9.6 oz (113.7 kg)   LMP 10/03/2015 (Approximate)   SpO2 95%   BMI 40.45 kg/m     Wt Readings from Last 3 Encounters:  07/08/23 250 lb 9.6 oz (113.7 kg)  06/13/23 265 lb (120.2 kg)  11/18/20 240 lb (108.9 kg)     GEN:  Well nourished, well developed in no acute distress HEENT:  Normal NECK: No JVD; No carotid bruits CARDIAC: RRR, no murmurs, rubs, gallops RESPIRATORY:  Clear to auscultation without rales, wheezing or rhonchi  ABDOMEN: Soft, non-tender, non-distended MUSCULOSKELETAL:  No edema; No deformity  SKIN: Warm and dry NEUROLOGIC:  Alert and oriented x 3 PSYCHIATRIC:  Normal affect   ASSESSMENT:    1. Precordial pain   2. Primary hypertension    PLAN:    In order of problems listed above:  Chest pain, several risk factors.  Get echo, get coronary CTA to evaluate CAD.  Episodes of discomfort occurred once, reassure patient if cardiac workup is unrevealing. Hypertension, BP controlled.  Continue losartan  50 mg daily, amlodipine  5 mg daily.  Follow-up after cardiac testing.      Medication Adjustments/Labs and Tests Ordered: Current medicines are reviewed at length with the patient today.  Concerns regarding medicines are outlined above.  Orders Placed This Encounter  Procedures   CT CORONARY MORPH W/CTA COR W/SCORE W/CA W/CM &/OR WO/CM   EKG 12-Lead   ECHOCARDIOGRAM COMPLETE   Meds ordered this encounter  Medications   metoprolol  tartrate (LOPRESSOR ) 100 MG tablet    Sig: Take 1 tablet by mouth once for procedure.    Dispense:  1 tablet    Refill:  0    Patient Instructions  Medication Instructions:  Take Metoprolol  100 mg two hours before your CT scan when scheduled.   *If you need a refill on your cardiac medications before your next appointment, please call your pharmacy*   Testing/Procedures: Coronary CTA- they will call you to schedule this, please follow instructions below.  Your physician has requested that you have an echocardiogram. Echocardiography is a painless test that uses sound waves to create images of your heart. It provides your doctor with information about the size and shape of your heart and how well your heart's chambers and valves are working.   You may receive an ultrasound enhancing agent through an IV if  needed to better visualize your heart during the echo. This procedure takes approximately one hour.  There are no restrictions for this procedure.  This will take place at [address] [facility] [address] [facility]) [address], [place] [address]  Please note: We ask at that you not bring children with you during ultrasound (echo/ vascular) testing. Due to room size and safety concerns, children are not allowed in the ultrasound rooms during exams. Our front office staff cannot provide observation of children in our lobby area while testing is being conducted. An adult accompanying a patient to their appointment will only be allowed in the ultrasound room at the discretion of the ultrasound technician under special circumstances. We apologize for any inconvenience.   Follow-Up: At [facility], you and your health needs are our priority.  As part of our continuing mission to provide you with exceptional heart care, our providers are all part of one team.  This team includes your primary Cardiologist (physician) and Advanced Practice Providers or APPs (Physician Assistants and Nurse Practitioners) who all work together to provide you with the care you need, when you need it.  Your next appointment:   3 month(s)  Provider:   You may see Dr.Agbor-Etang or one of the following Advanced Practice Providers on your designated Care Team:   Laneta Pintos, NP Gildardo Labrador, PA-C Varney Gentleman, PA-C Cadence Dana, PA-C Ronald Cockayne, NP Morey Ar, NP    We recommend signing up for the patient portal called "MyChart".  Sign up information is provided on this After Visit Summary.  MyChart is used to connect with patients for Virtual Visits (Telemedicine).  Patients are able to view lab/test results, encounter notes, upcoming appointments, etc.  Non-urgent messages can be sent to your provider as well.   To learn more about what you can do with MyChart, go to [email or web address].   Other  Instructions   Your cardiac CT will be scheduled at one of the below locations:   [facility] [address]  [address] [place], [place] [address] [phone]  OR   [facility] [address] [place], [place] [address] [phone]   There is spacious parking and easy access to the radiology department from the [facility] entrance. Please enter here and check-in with the desk attendant.    Please follow these instructions carefully (unless otherwise directed):  An IV will be required for this test and Nitroglycerin will be given.  Hold all erectile dysfunction medications at least 3 days (72 hrs) prior to test. (Ie viagra, cialis, sildenafil, tadalafil, etc)   On the Night Before the Test: Be sure to Drink plenty of water. Do not consume any caffeinated/decaffeinated beverages or chocolate 12 hours prior to your test. Do not take any antihistamines 12 hours prior to your test.  On the Day of the Test: Drink plenty of water until 1 hour prior to the test. Do not eat any food 1 hour prior to test. You may take your regular medications prior to the test.  Take metoprolol  (Lopressor ) two hours prior to test. If you take Furosemide /Hydrochlorothiazide /Spironolactone/Chlorthalidone, please HOLD on the morning of the test. Patients who wear a continuous glucose monitor MUST remove the device prior to scanning. FEMALES- please wear underwire-free bra if available, avoid dresses & tight clothing  After the Test: Drink plenty of water. After receiving IV contrast, you may experience a mild flushed feeling. This is normal. On occasion, you may experience a mild rash up to 24 hours after the test. This is not dangerous. If this occurs, you can take Benadryl  25 mg, Zyrtec, Claritin, or Allegra and increase your fluid intake. (Patients taking Tikosyn should avoid Benadryl , and may take Zyrtec, Claritin, or  Allegra) If you experience trouble breathing, this can be serious. If it is severe call 911 IMMEDIATELY. If it is mild, please call our office.  We will call to schedule your test 2-4 weeks out understanding that some insurance companies will need an authorization prior to the service being performed.   For more information and frequently asked questions, please visit our website : [email or web address]  For non-scheduling related questions, please contact the cardiac imaging nurse navigator should you have any questions/concerns: Cardiac Imaging Nurse Navigators Direct Office Dial: [phone]   For scheduling needs, including cancellations and rescheduling, please call [place], [phone].          Signed, Constancia Delton, MD  07/08/2023 3:38 PM    [facility]

## 2023-08-09 ENCOUNTER — Ambulatory Visit: Attending: Cardiology

## 2023-08-09 DIAGNOSIS — R072 Precordial pain: Secondary | ICD-10-CM | POA: Diagnosis not present

## 2023-08-09 DIAGNOSIS — I517 Cardiomegaly: Secondary | ICD-10-CM | POA: Diagnosis not present

## 2023-08-09 DIAGNOSIS — I340 Nonrheumatic mitral (valve) insufficiency: Secondary | ICD-10-CM | POA: Diagnosis not present

## 2023-08-09 LAB — ECHOCARDIOGRAM COMPLETE
AV Mean grad: 3 mmHg
AV Peak grad: 5.8 mmHg
Ao pk vel: 1.2 m/s
Area-P 1/2: 3.91 cm2

## 2023-08-10 ENCOUNTER — Ambulatory Visit: Payer: Self-pay | Admitting: Cardiology

## 2023-08-31 ENCOUNTER — Telehealth (HOSPITAL_COMMUNITY): Payer: Self-pay | Admitting: Emergency Medicine

## 2023-08-31 ENCOUNTER — Encounter (HOSPITAL_COMMUNITY): Payer: Self-pay

## 2023-08-31 NOTE — Telephone Encounter (Signed)
 Reaching out to patient to offer assistance regarding upcoming cardiac imaging study; pt verbalizes understanding of appt date/time, parking situation and where to check in, pre-test NPO status and medications ordered, and verified current allergies; name and call back number provided for further questions should they arise Rockwell Alexandria RN Navigator Cardiac Imaging [facility] [phone] office [phone] cell

## 2023-09-01 ENCOUNTER — Ambulatory Visit
Admission: RE | Admit: 2023-09-01 | Discharge: 2023-09-01 | Disposition: A | Discharge status: Home / Self Care | Source: Ambulatory Visit | Attending: Cardiology | Admitting: Cardiology

## 2023-09-01 DIAGNOSIS — R072 Precordial pain: Secondary | ICD-10-CM | POA: Insufficient documentation

## 2023-09-01 LAB — POCT I-STAT CREATININE: Creatinine, Ser: 0.9 mg/dL (ref 0.44–1.00)

## 2023-09-01 MED ORDER — NITROGLYCERIN 0.4 MG SL SUBL
0.8000 mg | SUBLINGUAL_TABLET | Freq: Once | SUBLINGUAL | Status: AC
  Administered 2023-09-01: 0.8 mg via SUBLINGUAL
  Filled 2023-09-01: qty 25

## 2023-09-01 MED ORDER — IOHEXOL 350 MG/ML SOLN
80.0000 mL | Freq: Once | INTRAVENOUS | Status: AC | PRN
  Administered 2023-09-01: 80 mL via INTRAVENOUS

## 2023-09-01 NOTE — Progress Notes (Signed)
 Patient tolerated procedure well. Ambulate w/o difficulty. Denies any lightheadedness or being dizzy. Pt denies any pain at this time. Sitting in chair. Pt is encouraged to drink additional water throughout the day and reason explained to patient. Patient verbalized understanding and all questions answered. ABC intact. No further needs at this time. Discharge from procedure area w/o issues.

## 2023-09-28 ENCOUNTER — Ambulatory Visit: Admitting: Cardiology

## 2023-10-07 ENCOUNTER — Ambulatory Visit: Admitting: Cardiology

## 2024-01-18 ENCOUNTER — Other Ambulatory Visit: Payer: Self-pay | Admitting: Nurse Practitioner

## 2024-01-18 DIAGNOSIS — Z1231 Encounter for screening mammogram for malignant neoplasm of breast: Secondary | ICD-10-CM

## 2024-02-23 ENCOUNTER — Encounter

## 2024-03-02 ENCOUNTER — Encounter

## 2024-04-05 ENCOUNTER — Ambulatory Visit
Admission: RE | Admit: 2024-04-05 | Discharge: 2024-04-05 | Disposition: A | Discharge status: Home / Self Care | Source: Ambulatory Visit | Attending: Nurse Practitioner | Admitting: Nurse Practitioner

## 2024-04-05 DIAGNOSIS — Z1231 Encounter for screening mammogram for malignant neoplasm of breast: Secondary | ICD-10-CM | POA: Insufficient documentation

## 2024-04-24 ENCOUNTER — Other Ambulatory Visit: Payer: Self-pay

## 2024-04-24 ENCOUNTER — Emergency Department
Admission: EM | Admit: 2024-04-24 | Discharge: 2024-04-24 | Disposition: A | Discharge status: Home / Self Care | Source: Home / Self Care | Attending: Emergency Medicine | Admitting: Emergency Medicine

## 2024-04-24 ENCOUNTER — Emergency Department

## 2024-04-24 DIAGNOSIS — M79605 Pain in left leg: Secondary | ICD-10-CM

## 2024-04-24 MED ORDER — PREDNISONE 10 MG (21) PO TBPK: ORAL_TABLET | ORAL | 0 refills | Status: AC

## 2024-04-24 MED ORDER — PREDNISONE 20 MG PO TABS
60.0000 mg | ORAL_TABLET | Freq: Once | ORAL | Status: AC
  Administered 2024-04-24: 60 mg via ORAL
  Filled 2024-04-24: qty 3

## 2024-04-24 MED ORDER — ACETAMINOPHEN 500 MG PO TABS
1000.0000 mg | ORAL_TABLET | Freq: Once | ORAL | Status: AC
  Administered 2024-04-24: 1000 mg via ORAL
  Filled 2024-04-24: qty 2

## 2024-04-24 NOTE — ED Notes (Signed)
 Pt verbalized understanding of discharge instructions. Opportunity for questions provided.

## 2024-04-24 NOTE — ED Notes (Addendum)
 Pt c/o LLE pain x3 days.    Pt noted to walk to Flex area w/o issue.

## 2024-04-24 NOTE — ED Provider Notes (Cosign Needed)
 "  [facility] Provider Note    Event Date/Time   First MD Initiated Contact with Patient 04/24/24 1119     (approximate)   History   Leg Pain   HPI  Katherine Estes is a 64 y.o. female  with a past medical history of TKR of right knee, Type II DM, HTN, Hypothyroidism, osteoporosis, asthma presents to the emergency department with left leg pain that started 3 days ago on Saturday. She reports a throbbing sensation in her left anterior hip that goes down into her left anterolateral thigh and calf. She also describes a different burning sensation in left foot specifically going to the left great toe. She denies fall or injury, back pain, urinary symptoms, weakness in her legs, chest pain, shortness of breath, prolonged immobility/recent surgery, leg trauma, OCP usage, smoking.  She reports she is not on any blood thinner at this time and has not been taking aspirin  since April 2025. Denies hx of blood clots. Denies surgical hx to left leg.  Physical Exam   Triage Vital Signs: ED Triage Vitals [04/24/24 1017]  Encounter Vitals Group     BP (!) 175/88     Girls Systolic BP Percentile      Girls Diastolic BP Percentile      Boys Systolic BP Percentile      Boys Diastolic BP Percentile      Pulse Rate 76     Resp 18     Temp 98 F (36.7 C)     Temp src      SpO2 99 %     Weight 245 lb (111.1 kg)     Height 5' 6 (1.676 m)     Head Circumference      Peak Flow      Pain Score 10     Pain Loc      Pain Education      Exclude from Growth Chart     Most recent vital signs: Vitals:   04/24/24 1017 04/24/24 1333  BP: (!) 175/88 (!) 172/81  Pulse: 76 66  Resp: 18 17  Temp: 98 F (36.7 C) 97.9 F (36.6 C)  SpO2: 99% 100%    General: Awake, in no acute distress. Appears stated age. CV: Good peripheral perfusion. RRR, 76 bpm. Respiratory:Normal respiratory effort.  No respiratory distress. CTAB. GI: Soft, non-distended. MSK: Normal ROM and  5/5  strength in b/l lower extremities. Mild TTP along left anterior groin and anterior thigh. Skin:Warm, dry, intact. No rashes, lesions, or ecchymosis.  No asymmetrical calf swelling. Neurological: A&Ox4 to person, place, time, and situation.  Unable to assess w/ SLR given patient is in hallway bed.  ED Results / Procedures / Treatments   Labs (all labs ordered are listed, but only abnormal results are displayed) Labs Reviewed - No data to display   EKG     RADIOLOGY Left hip x ray FINDINGS:   BONES AND JOINTS: SI joints are symmetric. Degenerative changes both SI joints. No acute fracture. Bilateral hips demonstrate normal alignment. Mild degenerative changes both hips. Postsurgical changes over pubic symphysis.   SOFT TISSUES: Unremarkable.   IMPRESSION: 1. No acute findings.  Doppler US  LLE IMPRESSION: Negative.  PROCEDURES:  Critical Care performed: No   Procedures   MEDICATIONS ORDERED IN ED: Medications  acetaminophen  (TYLENOL ) tablet 1,000 mg (1,000 mg Oral Given 04/24/24 1230)  predniSONE  (DELTASONE ) tablet 60 mg (60 mg Oral Given 04/24/24 1413)     IMPRESSION / MDM /  ASSESSMENT AND PLAN / ED COURSE  I reviewed the triage vital signs and the nursing notes.                              Differential diagnosis includes, but is not limited to, MSK pain, lumbar radiculopathy, hip osteoarthritis  Patient's presentation is most consistent with acute complicated illness / injury requiring diagnostic workup.  Patient is a 64 year old female presenting with signs and symptoms as described above. She is neurovascularly intact, able to ambulate without assistance. Unable to accurately assess for lumbar radiculopathy with straight leg raise test given patient in hallway bed.  No asymmetrical calf swelling.  No risk factors for DVT. No CP or SOB. Low suspicion for DVT, but given pain of entire left leg without fall or injury, ordered doppler US  of LLE which showed no  evidence of DVT. Left hip x ray ordered as well. I independently viewed the x-ray and radiologist's report.  I agree with the radiologist's report there are no acute findings of the left hip.  Discussed will give short prednisone  taper and Tylenol  given this may be a lumbar radiculopathy that I cannot accurate assess for vs MSK pain. She can f/u with her PCP outpatient.  The patient may return to the emergency department for any new, worsening, or concerning symptoms. Patient was given the opportunity to ask questions; all questions were answered. Emergency department return precautions were discussed with the patient.  Patient is in agreement to the treatment plan.  Patient is stable for discharge.   FINAL CLINICAL IMPRESSION(S) / ED DIAGNOSES   Final diagnoses:  Left leg pain     Rx / DC Orders   ED Discharge Orders          Ordered    predniSONE  (STERAPRED UNI-PAK 21 TAB) 10 MG (21) TBPK tablet        04/24/24 1358             Note:  This document was prepared using Dragon voice recognition software and may include unintentional dictation errors.     Sheron Rondo, [place] 04/24/24 1619  "
